# Patient Record
Sex: Female | Born: 1965 | Race: Black or African American | Hispanic: No | Marital: Single | Smoking: Never smoker
Health system: Southern US, Community
[De-identification: ages and names within clinical notes are randomized; demographics above are authoritative.]

## PROBLEM LIST (undated history)

## (undated) DIAGNOSIS — D649 Anemia, unspecified: Secondary | ICD-10-CM

## (undated) DIAGNOSIS — F70 Mild intellectual disabilities: Secondary | ICD-10-CM

## (undated) DIAGNOSIS — E119 Type 2 diabetes mellitus without complications: Secondary | ICD-10-CM

## (undated) DIAGNOSIS — E785 Hyperlipidemia, unspecified: Secondary | ICD-10-CM

## (undated) DIAGNOSIS — I10 Essential (primary) hypertension: Secondary | ICD-10-CM

## (undated) DIAGNOSIS — H9190 Unspecified hearing loss, unspecified ear: Secondary | ICD-10-CM

## (undated) HISTORY — PX: RETINAL DETACHMENT SURGERY: SHX105

## (undated) HISTORY — DX: Hyperlipidemia, unspecified: E78.5

## (undated) HISTORY — DX: Essential (primary) hypertension: I10

## (undated) HISTORY — DX: Type 2 diabetes mellitus without complications: E11.9

## (undated) HISTORY — DX: Anemia, unspecified: D64.9

## (undated) HISTORY — PX: CATARACT EXTRACTION: SUR2

## (undated) HISTORY — DX: Mild intellectual disabilities: F70

## (undated) HISTORY — DX: Unspecified hearing loss, unspecified ear: H91.90

---

## 2016-03-21 LAB — HEMOGLOBIN A1C: Hemoglobin A1C: 10.3

## 2016-05-02 ENCOUNTER — Ambulatory Visit: Payer: Self-pay | Admitting: "Endocrinology

## 2016-06-13 ENCOUNTER — Ambulatory Visit: Payer: Self-pay | Admitting: "Endocrinology

## 2016-08-06 ENCOUNTER — Encounter: Payer: Self-pay | Admitting: "Endocrinology

## 2016-08-06 ENCOUNTER — Ambulatory Visit (INDEPENDENT_AMBULATORY_CARE_PROVIDER_SITE_OTHER): Payer: Medicare Other | Admitting: "Endocrinology

## 2016-08-06 VITALS — BP 135/8 | HR 92 | Wt 139.0 lb

## 2016-08-06 DIAGNOSIS — E782 Mixed hyperlipidemia: Secondary | ICD-10-CM | POA: Insufficient documentation

## 2016-08-06 DIAGNOSIS — E118 Type 2 diabetes mellitus with unspecified complications: Secondary | ICD-10-CM | POA: Diagnosis not present

## 2016-08-06 DIAGNOSIS — I10 Essential (primary) hypertension: Secondary | ICD-10-CM | POA: Insufficient documentation

## 2016-08-06 DIAGNOSIS — E785 Hyperlipidemia, unspecified: Secondary | ICD-10-CM | POA: Diagnosis not present

## 2016-08-06 DIAGNOSIS — E1165 Type 2 diabetes mellitus with hyperglycemia: Secondary | ICD-10-CM

## 2016-08-06 DIAGNOSIS — IMO0002 Reserved for concepts with insufficient information to code with codable children: Secondary | ICD-10-CM | POA: Insufficient documentation

## 2016-08-06 MED ORDER — METFORMIN HCL 500 MG PO TABS: 500.0000 mg | ORAL_TABLET | Freq: Two times a day (BID) | ORAL | 3 refills | Status: DC

## 2016-08-06 NOTE — Progress Notes (Signed)
Subjective:    Patient ID: Ashley Blanchard, female    DOB: 1966-10-13. Patient is being seen in consultation for management of diabetes requested by  TAPPER,DAVID B, MD  Past Medical History:  Diagnosis Date  . Chronic anemia   . Diabetes mellitus, type II (HCC)   . Hearing loss   . Hyperlipidemia   . Hypertension   . Mild mental retardation    Past Surgical History:  Procedure Laterality Date  . CATARACT EXTRACTION    . RETINAL DETACHMENT SURGERY     Social History   Social History  . Marital status: Single    Spouse name: N/A  . Number of children: N/A  . Years of education: N/A   Social History Main Topics  . Smoking status: Never Smoker  . Smokeless tobacco: Never Used  . Alcohol use No  . Drug use: No  . Sexual activity: Not Asked   Other Topics Concern  . None   Social History Narrative  . None   Outpatient Encounter Prescriptions as of 08/06/2016  Medication Sig  . aspirin EC 81 MG tablet Take 81 mg by mouth daily.  Marland Kitchen. atorvastatin (LIPITOR) 20 MG tablet Take 20 mg by mouth daily.  Marland Kitchen. diltiazem (DILACOR XR) 180 MG 24 hr capsule Take 180 mg by mouth daily.  Marland Kitchen. latanoprost (XALATAN) 0.005 % ophthalmic solution 1 drop at bedtime.  . ramipril (ALTACE) 5 MG capsule Take 5 mg by mouth daily.  . traZODone (DESYREL) 50 MG tablet Take 50 mg by mouth at bedtime.  . [DISCONTINUED] glyBURIDE-metformin (GLUCOVANCE) 5-500 MG tablet Take 1 tablet by mouth daily with breakfast.  . metFORMIN (GLUCOPHAGE) 500 MG tablet Take 1 tablet (500 mg total) by mouth 2 (two) times daily with a meal.   No facility-administered encounter medications on file as of 08/06/2016.    ALLERGIES: No Known Allergies VACCINATION STATUS:  There is no immunization history on file for this patient.  Diabetes  She presents for her initial diabetic visit. She has type 2 diabetes mellitus. Onset time: She is not sure when she was diagnosed with diabetes. Her disease course has been worsening (Her  recent A1c is higher at 10.3% in May 2017.). There are no hypoglycemic associated symptoms. Pertinent negatives for hypoglycemia include no confusion, headaches, pallor or seizures. Associated symptoms include fatigue, polydipsia and polyuria. Pertinent negatives for diabetes include no chest pain and no polyphagia. There are no hypoglycemic complications. Symptoms are worsening. There are no diabetic complications. Risk factors for coronary artery disease include diabetes mellitus, hypertension and dyslipidemia. Current diabetic treatment includes oral agent (monotherapy) (She is on glyburide/metformin 08/999 twice a day). Compliance with diabetes treatment: She is in a group home due to failure to care for self. Her weight is stable. She is following a generally unhealthy diet. When asked about meal planning, she reported none. She has not had a previous visit with a dietitian. She rarely participates in exercise. Her overall blood glucose range is >200 mg/dl. An ACE inhibitor/angiotensin II receptor blocker is being taken.    Review of Systems  Constitutional: Positive for fatigue. Negative for unexpected weight change.  HENT: Negative for trouble swallowing and voice change.   Eyes: Negative for visual disturbance.  Respiratory: Negative for cough, shortness of breath and wheezing.   Cardiovascular: Negative for chest pain, palpitations and leg swelling.  Gastrointestinal: Negative for diarrhea, nausea and vomiting.  Endocrine: Positive for polydipsia and polyuria. Negative for cold intolerance, heat intolerance and polyphagia.  Genitourinary: Positive for frequency. Negative for dysuria and flank pain.  Musculoskeletal: Negative for arthralgias and myalgias.  Skin: Negative for color change, pallor, rash and wound.  Neurological: Negative for seizures and headaches.  Psychiatric/Behavioral: Negative for confusion and suicidal ideas.    Objective:    BP (!) 135/8   Pulse 92   Wt 139 lb (63  kg)   Wt Readings from Last 3 Encounters:  08/06/16 139 lb (63 kg)    Physical Exam  Constitutional:  Light build.  HENT:  Head: Normocephalic and atraumatic.  Eyes: EOM are normal.  Neck: Normal range of motion. Neck supple. No tracheal deviation present. No thyromegaly present.  Cardiovascular: Normal rate and regular rhythm.   Pulmonary/Chest: Effort normal and breath sounds normal.  Abdominal: Soft. Bowel sounds are normal. There is no tenderness. There is no guarding.  Musculoskeletal: Normal range of motion. She exhibits no edema.  Neurological: She is alert. No cranial nerve deficit. Coordination normal.  She is hard of hearing. She has reported mental retardation accompanied by her care manager.  Skin: Skin is warm and dry. No rash noted. No erythema. No pallor.  Psychiatric: She has a normal mood and affect.   03/29/2016 A1c was 10.3%.  Assessment & Plan:   1. Uncontrolled type 2 diabetes mellitus with complication, without long-term current use of insulin (HCC)  - Patient has currently uncontrolled symptomatic type 2 DM Duration unknown,  with most recent A1c of 10.3 %.  - She has no recent labs to review. Labs from May 2017 showed normal renal function.   - She has no reported gross complications from diabetes however patient remains at a high risk for more acute and chronic complications of diabetes which include CAD, CVA, CKD, retinopathy, and neuropathy.  -  She does not understand the high risk she has, these are all discussed with her care manager from the group home.  - I have counseled the patient on diet management by adopting a carbohydrate restricted/protein rich diet.  - Suggestion is made for patient to avoid simple carbohydrates   from their diet including Cakes , Desserts, Ice Cream,  Soda (  diet and regular) , Sweet Tea , Candies,  Chips, Cookies, Artificial Sweeteners,   and "Sugar-free" Products . This will help patient to have stable blood glucose  profile and potentially avoid unintended weight gain.  - I encouraged the patient to switch to  unprocessed or minimally processed complex starch and increased protein intake (animal or plant source), fruits, and vegetables.  - Patient is advised to stick to a routine mealtimes to eat 3 meals  a day and avoid unnecessary snacks ( to snack only to correct hypoglycemia).  - The patient will be scheduled with Norm Salt, RDN, CDE for individualized DM education.  - I have approached patient with the following individualized plan to manage diabetes and patient agrees:   - She will likely require insulin treatment for diabetes after appropriate monitoring routine is assured. - I have requested for  strict monitoring of glucose  AC and HS and return in one week with meter and logs.   -Patient is encouraged to call clinic for blood glucose levels less than 70 or above 300 mg /dl.  - I will disglyburide/metformin, risk outweighs benefit for this patient. - She would still benefit from low-dose metformin which I will prescribe separately at 500 mg by mouth twice a day.   - She is not suitable candidate for SGLt2 inhibitors nor  incretin therapy. - Patient specific target  A1c;  LDL, HDL, Triglycerides, and  Waist Circumference were discussed in detail.  2) BP/HT : controlled, continue current medications including ACEI/ARB. 3) Lipids/HPL:   control unknown, continue statins. 4)  Weight/Diet: she is light build,  , exercise, and detailed carbohydrates information provided.  5) Chronic Care/Health Maintenance:  -Patient is on ACEI/ARB and Statin medications and encouraged to continue to follow up with Ophthalmology, Podiatrist at least yearly or according to recommendations, and advised to   stay away from smoking. I have recommended yearly flu vaccine and pneumonia vaccination at least every 5 years; moderate intensity exercise for up to 150 minutes weekly; and  sleep for at least 7 hours a  day.  - 60 minutes of time was spent on the care of this patient , 50% of which was applied for counseling on diabetes complications and their preventions.  - Patient to bring meter and  blood glucose logs during their next visit.   - I advised patient to maintain close follow up with TAPPER,DAVID B, MD for primary care needs.  Follow up plan: - Return in about 1 week (around 08/13/2016) for follow up with pre-visit labs, meter, and logs, labs today.  Marquis Lunch, MD Phone: [phone]  Fax: [phone]   08/06/2016, 11:38 AM

## 2016-08-07 LAB — COMPREHENSIVE METABOLIC PANEL
ALT: 16 U/L (ref 6–29)
AST: 24 U/L (ref 10–35)
Albumin: 3.6 g/dL (ref 3.6–5.1)
Alkaline Phosphatase: 87 U/L (ref 33–130)
BUN: 14 mg/dL (ref 7–25)
CO2: 26 mmol/L (ref 20–31)
Calcium: 10.4 mg/dL (ref 8.6–10.4)
Chloride: 99 mmol/L (ref 98–110)
Creat: 0.98 mg/dL (ref 0.50–1.05)
Glucose, Bld: 250 mg/dL — ABNORMAL HIGH (ref 65–99)
Potassium: 5.1 mmol/L (ref 3.5–5.3)
Sodium: 138 mmol/L (ref 135–146)
Total Bilirubin: 0.3 mg/dL (ref 0.2–1.2)
Total Protein: 6.8 g/dL (ref 6.1–8.1)

## 2016-08-07 LAB — T4, FREE: Free T4: 1 ng/dL (ref 0.8–1.8)

## 2016-08-07 LAB — TSH: TSH: 2.49 mIU/L

## 2016-08-08 LAB — HEMOGLOBIN A1C
Hgb A1c MFr Bld: 9.4 % — ABNORMAL HIGH (ref <5.7)
Mean Plasma Glucose: 223 mg/dL

## 2016-08-14 ENCOUNTER — Ambulatory Visit: Payer: Medicaid Other | Admitting: "Endocrinology

## 2016-08-15 ENCOUNTER — Ambulatory Visit (INDEPENDENT_AMBULATORY_CARE_PROVIDER_SITE_OTHER): Payer: Medicare Other | Admitting: "Endocrinology

## 2016-08-15 ENCOUNTER — Encounter: Payer: Self-pay | Admitting: "Endocrinology

## 2016-08-15 VITALS — BP 133/83 | HR 92 | Wt 138.0 lb

## 2016-08-15 DIAGNOSIS — I10 Essential (primary) hypertension: Secondary | ICD-10-CM | POA: Diagnosis not present

## 2016-08-15 DIAGNOSIS — IMO0002 Reserved for concepts with insufficient information to code with codable children: Secondary | ICD-10-CM

## 2016-08-15 DIAGNOSIS — E1165 Type 2 diabetes mellitus with hyperglycemia: Secondary | ICD-10-CM | POA: Diagnosis not present

## 2016-08-15 DIAGNOSIS — E118 Type 2 diabetes mellitus with unspecified complications: Secondary | ICD-10-CM | POA: Diagnosis not present

## 2016-08-15 DIAGNOSIS — E782 Mixed hyperlipidemia: Secondary | ICD-10-CM

## 2016-08-15 MED ORDER — INSULIN GLARGINE 100 UNIT/ML SOLOSTAR PEN: 15.0000 [IU] | PEN_INJECTOR | Freq: Every day | SUBCUTANEOUS | 2 refills | Status: DC

## 2016-08-15 NOTE — Progress Notes (Signed)
Subjective:    Patient ID: Ashley Blanchard, female    DOB: [birth year]. Patient is being seen in consultation for management of diabetes requested by  TAPPER,DAVID B, MD  Past Medical History:  Diagnosis Date  . Chronic anemia   . Diabetes mellitus, type II (HCC)   . Hearing loss   . Hyperlipidemia   . Hypertension   . Mild mental retardation    Past Surgical History:  Procedure Laterality Date  . CATARACT EXTRACTION    . RETINAL DETACHMENT SURGERY     Social History   Social History  . Marital status: Single    Spouse name: N/A  . Number of children: N/A  . Years of education: N/A   Social History Main Topics  . Smoking status: Never Smoker  . Smokeless tobacco: Never Used  . Alcohol use No  . Drug use: No  . Sexual activity: Not Asked   Other Topics Concern  . None   Social History Narrative  . None   Outpatient Encounter Prescriptions as of 08/15/2016  Medication Sig  . aspirin EC 81 MG tablet Take 81 mg by mouth daily.  Marland Kitchen atorvastatin (LIPITOR) 20 MG tablet Take 20 mg by mouth daily.  Marland Kitchen diltiazem (DILACOR XR) 180 MG 24 hr capsule Take 180 mg by mouth daily.  . Insulin Glargine (LANTUS SOLOSTAR) 100 UNIT/ML Solostar Pen Inject 15 Units into the skin daily with breakfast.  . latanoprost (XALATAN) 0.005 % ophthalmic solution 1 drop at bedtime.  . metFORMIN (GLUCOPHAGE) 500 MG tablet Take 1 tablet (500 mg total) by mouth 2 (two) times daily with a meal.  . ramipril (ALTACE) 5 MG capsule Take 5 mg by mouth daily.  . traZODone (DESYREL) 50 MG tablet Take 50 mg by mouth at bedtime.  . [DISCONTINUED] Insulin Glargine (LANTUS SOLOSTAR) 100 UNIT/ML Solostar Pen Inject 15 Units into the skin daily with breakfast.   No facility-administered encounter medications on file as of 08/15/2016.    ALLERGIES: No Known Allergies VACCINATION STATUS:  There is no immunization history on file for this patient.  Diabetes  She presents for her follow-up diabetic visit. She has  type 2 diabetes mellitus. Onset time: She is not sure when she was diagnosed with diabetes. Her disease course has been worsening (Her recent A1c is higher at 10.3% in May 2017.). There are no hypoglycemic associated symptoms. Pertinent negatives for hypoglycemia include no confusion, headaches, pallor or seizures. Associated symptoms include fatigue, polydipsia and polyuria. Pertinent negatives for diabetes include no chest pain and no polyphagia. There are no hypoglycemic complications. Symptoms are worsening. There are no diabetic complications. Risk factors for coronary artery disease include diabetes mellitus, hypertension and dyslipidemia. Current diabetic treatment includes oral agent (monotherapy) (She is on glyburide/metformin 08/999 twice a day). Compliance with diabetes treatment: She is in a group home due to failure to care for self. Her weight is stable. She is following a generally unhealthy diet. When asked about meal planning, she reported none. She has not had a previous visit with a dietitian. She rarely participates in exercise. Her breakfast blood glucose range is generally 140-180 mg/dl. Her lunch blood glucose range is generally >200 mg/dl. Her dinner blood glucose range is generally >200 mg/dl. Her overall blood glucose range is >200 mg/dl. An ACE inhibitor/angiotensin II receptor blocker is being taken.    Review of Systems  Constitutional: Positive for fatigue. Negative for unexpected weight change.  HENT: Negative for trouble swallowing and voice change.  Eyes: Negative for visual disturbance.  Respiratory: Negative for cough, shortness of breath and wheezing.   Cardiovascular: Negative for chest pain, palpitations and leg swelling.  Gastrointestinal: Negative for diarrhea, nausea and vomiting.  Endocrine: Positive for polydipsia and polyuria. Negative for cold intolerance, heat intolerance and polyphagia.  Genitourinary: Positive for frequency. Negative for dysuria and flank  pain.  Musculoskeletal: Negative for arthralgias and myalgias.  Skin: Negative for color change, pallor, rash and wound.  Neurological: Negative for seizures and headaches.  Psychiatric/Behavioral: Negative for confusion and suicidal ideas.    Objective:    BP 133/83   Pulse 92   Wt 138 lb (62.6 kg)   Wt Readings from Last 3 Encounters:  08/15/16 138 lb (62.6 kg)  08/06/16 139 lb (63 kg)    Physical Exam  Constitutional:  Light build.  HENT:  Head: Normocephalic and atraumatic.  Eyes: EOM are normal.  Neck: Normal range of motion. Neck supple. No tracheal deviation present. No thyromegaly present.  Cardiovascular: Normal rate and regular rhythm.   Pulmonary/Chest: Effort normal and breath sounds normal.  Abdominal: Soft. Bowel sounds are normal. There is no tenderness. There is no guarding.  Musculoskeletal: Normal range of motion. She exhibits no edema.  Neurological: She is alert. No cranial nerve deficit. Coordination normal.  She is hard of hearing. She has reported mental retardation accompanied by her care manager.  Skin: Skin is warm and dry. No rash noted. No erythema. No pallor.  Psychiatric: She has a normal mood and affect.   03/29/2016 A1c was 10.3%.  Assessment & Plan:   1. Uncontrolled type 2 diabetes mellitus with complication, without long-term current use of insulin (HCC)  - Patient has currently uncontrolled symptomatic type 2 DM Duration unknown,  with most recent A1c of 10.3 %.  - She has no recent labs to review. Labs from May 2017 showed normal renal function.   - She has no reported gross complications from diabetes however patient remains at a high risk for more acute and chronic complications of diabetes which include CAD, CVA, CKD, retinopathy, and neuropathy.  -  She does not understand the high risk she has, these are all discussed with her care manager from the group home.  - I have counseled the patient on diet management by adopting a  carbohydrate restricted/protein rich diet.  - Suggestion is made for patient to avoid simple carbohydrates   from their diet including Cakes , Desserts, Ice Cream,  Soda (  diet and regular) , Sweet Tea , Candies,  Chips, Cookies, Artificial Sweeteners,   and "Sugar-free" Products . This will help patient to have stable blood glucose profile and potentially avoid unintended weight gain.  - I encouraged the patient to switch to  unprocessed or minimally processed complex starch and increased protein intake (animal or plant source), fruits, and vegetables.  - Patient is advised to stick to a routine mealtimes to eat 3 meals  a day and avoid unnecessary snacks ( to snack only to correct hypoglycemia).  - The patient will be scheduled with Norm SaltPenny Crumpton, RDN, CDE for individualized DM education.  - I have approached patient with the following individualized plan to manage diabetes and patient agrees:   - Based on her severe glycemic burden, She will  require insulin treatment for diabetes . - I will initiate Lantus 15 units subcutaneously daily with breakfast, associated with monitoring of blood glucose before meals and at bedtime.  -Patient's care taker is encouraged to call clinic  for blood glucose levels less than 70 or above 300 mg /dl.  - I will continue low  -dose metformin 500 mg by mouth twice a day.   - She is not suitable candidate for SGLt2 inhibitors nor incretin therapy. - Patient specific target  A1c;  LDL, HDL, Triglycerides, and  Waist Circumference were discussed in detail.  2) BP/HT : controlled, continue current medications including ACEI/ARB. 3) Lipids/HPL:   control unknown, continue statins. 4)  Weight/Diet: she is light build,  , exercise, and detailed carbohydrates information provided.  5) Chronic Care/Health Maintenance:  -Patient is on ACEI/ARB and Statin medications and encouraged to continue to follow up with Ophthalmology, Podiatrist at least yearly or according  to recommendations, and advised to   stay away from smoking. I have recommended yearly flu vaccine and pneumonia vaccination at least every 5 years; moderate intensity exercise for up to 150 minutes weekly; and  sleep for at least 7 hours a day.  - 30 minutes of time was spent on the care of this patient , 50% of which was applied for counseling on diabetes complications and their preventions.  - Patient to bring meter and  blood glucose logs during their next visit.   - I advised patient to maintain close follow up with TAPPER,DAVID B, MD for primary care needs.  Follow up plan: - Return in about 2 weeks (around 08/29/2016) for follow up with meter and logs- no labs.  Marquis Lunch, MD Phone: [phone]  Fax: [phone]   08/15/2016, 11:06 AM

## 2016-08-31 ENCOUNTER — Ambulatory Visit: Payer: Medicare Other | Admitting: "Endocrinology

## 2016-10-22 ENCOUNTER — Encounter (HOSPITAL_COMMUNITY): Payer: Self-pay | Admitting: Emergency Medicine

## 2016-10-22 ENCOUNTER — Inpatient Hospital Stay (HOSPITAL_COMMUNITY)
Admission: EM | Admit: 2016-10-22 | Discharge: 2016-10-25 | DRG: 872 | Disposition: A | Discharge status: Home / Self Care | Payer: Medicare Other | Attending: Internal Medicine | Admitting: Internal Medicine

## 2016-10-22 ENCOUNTER — Emergency Department (HOSPITAL_COMMUNITY): Payer: Medicare Other

## 2016-10-22 DIAGNOSIS — Z832 Family history of diseases of the blood and blood-forming organs and certain disorders involving the immune mechanism: Secondary | ICD-10-CM | POA: Diagnosis not present

## 2016-10-22 DIAGNOSIS — F70 Mild intellectual disabilities: Secondary | ICD-10-CM | POA: Diagnosis present

## 2016-10-22 DIAGNOSIS — D649 Anemia, unspecified: Secondary | ICD-10-CM | POA: Diagnosis present

## 2016-10-22 DIAGNOSIS — Z9849 Cataract extraction status, unspecified eye: Secondary | ICD-10-CM | POA: Diagnosis not present

## 2016-10-22 DIAGNOSIS — Z8261 Family history of arthritis: Secondary | ICD-10-CM

## 2016-10-22 DIAGNOSIS — Z8249 Family history of ischemic heart disease and other diseases of the circulatory system: Secondary | ICD-10-CM

## 2016-10-22 DIAGNOSIS — R410 Disorientation, unspecified: Secondary | ICD-10-CM

## 2016-10-22 DIAGNOSIS — N390 Urinary tract infection, site not specified: Secondary | ICD-10-CM | POA: Diagnosis present

## 2016-10-22 DIAGNOSIS — E872 Acidosis, unspecified: Secondary | ICD-10-CM | POA: Diagnosis present

## 2016-10-22 DIAGNOSIS — Z7982 Long term (current) use of aspirin: Secondary | ICD-10-CM | POA: Diagnosis not present

## 2016-10-22 DIAGNOSIS — H9190 Unspecified hearing loss, unspecified ear: Secondary | ICD-10-CM | POA: Diagnosis present

## 2016-10-22 DIAGNOSIS — J9811 Atelectasis: Secondary | ICD-10-CM | POA: Diagnosis present

## 2016-10-22 DIAGNOSIS — I10 Essential (primary) hypertension: Secondary | ICD-10-CM | POA: Diagnosis present

## 2016-10-22 DIAGNOSIS — Z811 Family history of alcohol abuse and dependence: Secondary | ICD-10-CM

## 2016-10-22 DIAGNOSIS — Z794 Long term (current) use of insulin: Secondary | ICD-10-CM | POA: Diagnosis not present

## 2016-10-22 DIAGNOSIS — M6281 Muscle weakness (generalized): Secondary | ICD-10-CM

## 2016-10-22 DIAGNOSIS — N3000 Acute cystitis without hematuria: Secondary | ICD-10-CM

## 2016-10-22 DIAGNOSIS — Z7984 Long term (current) use of oral hypoglycemic drugs: Secondary | ICD-10-CM | POA: Diagnosis not present

## 2016-10-22 DIAGNOSIS — E785 Hyperlipidemia, unspecified: Secondary | ICD-10-CM | POA: Diagnosis present

## 2016-10-22 DIAGNOSIS — D72829 Elevated white blood cell count, unspecified: Secondary | ICD-10-CM | POA: Diagnosis present

## 2016-10-22 DIAGNOSIS — E119 Type 2 diabetes mellitus without complications: Secondary | ICD-10-CM | POA: Diagnosis present

## 2016-10-22 DIAGNOSIS — A419 Sepsis, unspecified organism: Secondary | ICD-10-CM | POA: Diagnosis present

## 2016-10-22 DIAGNOSIS — Z833 Family history of diabetes mellitus: Secondary | ICD-10-CM | POA: Diagnosis not present

## 2016-10-22 DIAGNOSIS — E782 Mixed hyperlipidemia: Secondary | ICD-10-CM | POA: Diagnosis not present

## 2016-10-22 DIAGNOSIS — Z79899 Other long term (current) drug therapy: Secondary | ICD-10-CM

## 2016-10-22 LAB — COMPREHENSIVE METABOLIC PANEL
ALT: 26 U/L (ref 14–54)
AST: 38 U/L (ref 15–41)
Albumin: 2.6 g/dL — ABNORMAL LOW (ref 3.5–5.0)
Alkaline Phosphatase: 93 U/L (ref 38–126)
Anion gap: 9 (ref 5–15)
BUN: 19 mg/dL (ref 6–20)
CO2: 21 mmol/L — ABNORMAL LOW (ref 22–32)
Calcium: 8.4 mg/dL — ABNORMAL LOW (ref 8.9–10.3)
Chloride: 106 mmol/L (ref 101–111)
Creatinine, Ser: 0.89 mg/dL (ref 0.44–1.00)
GFR calc Af Amer: >60 mL/min (ref >60)
GFR calc non Af Amer: >60 mL/min (ref >60)
Glucose, Bld: 139 mg/dL — ABNORMAL HIGH (ref 65–99)
Potassium: 4.1 mmol/L (ref 3.5–5.1)
Sodium: 136 mmol/L (ref 135–145)
Total Bilirubin: 0.3 mg/dL (ref 0.3–1.2)
Total Protein: 5.5 g/dL — ABNORMAL LOW (ref 6.5–8.1)

## 2016-10-22 LAB — URINALYSIS, ROUTINE W REFLEX MICROSCOPIC
Bilirubin Urine: NEGATIVE
Glucose, UA: NEGATIVE mg/dL
Ketones, ur: NEGATIVE mg/dL
Leukocytes, UA: NEGATIVE
Nitrite: POSITIVE — AB
Protein, ur: NEGATIVE mg/dL
Specific Gravity, Urine: 1.013 (ref 1.005–1.030)
pH: 5 (ref 5.0–8.0)

## 2016-10-22 LAB — I-STAT CG4 LACTIC ACID, ED
Lactic Acid, Venous: 3.54 mmol/L (ref 0.5–1.9)
Lactic Acid, Venous: 2.75 mmol/L (ref 0.5–1.9)

## 2016-10-22 LAB — I-STAT TROPONIN, ED: Troponin i, poc: 0 ng/mL (ref 0.00–0.08)

## 2016-10-22 LAB — GLUCOSE, CAPILLARY: Glucose-Capillary: 88 mg/dL (ref 65–99)

## 2016-10-22 LAB — CBC WITH DIFFERENTIAL/PLATELET
Basophils Absolute: 0 10*3/uL (ref 0.0–0.1)
Basophils Relative: 0 %
Eosinophils Absolute: 0 10*3/uL (ref 0.0–0.7)
Eosinophils Relative: 0 %
HCT: 38 % (ref 36.0–46.0)
Hemoglobin: 11.8 g/dL — ABNORMAL LOW (ref 12.0–15.0)
Lymphocytes Relative: 3 %
Lymphs Abs: 0.4 10*3/uL — ABNORMAL LOW (ref 0.7–4.0)
MCH: 25.5 pg — ABNORMAL LOW (ref 26.0–34.0)
MCHC: 31.1 g/dL (ref 30.0–36.0)
MCV: 82.3 fL (ref 78.0–100.0)
Monocytes Absolute: 0.5 10*3/uL (ref 0.1–1.0)
Monocytes Relative: 4 %
Neutro Abs: 12.4 10*3/uL — ABNORMAL HIGH (ref 1.7–7.7)
Neutrophils Relative %: 93 %
Platelets: 236 10*3/uL (ref 150–400)
RBC: 4.62 MIL/uL (ref 3.87–5.11)
RDW: 15.6 % — ABNORMAL HIGH (ref 11.5–15.5)
WBC: 13.3 10*3/uL — ABNORMAL HIGH (ref 4.0–10.5)

## 2016-10-22 LAB — MRSA PCR SCREENING: MRSA by PCR: NEGATIVE

## 2016-10-22 LAB — C DIFFICILE QUICK SCREEN W PCR REFLEX
C Diff antigen: NEGATIVE
C Diff interpretation: NOT DETECTED
C Diff toxin: NEGATIVE

## 2016-10-22 LAB — PHOSPHORUS: Phosphorus: 2.9 mg/dL (ref 2.5–4.6)

## 2016-10-22 LAB — CBG MONITORING, ED: Glucose-Capillary: 102 mg/dL — ABNORMAL HIGH (ref 65–99)

## 2016-10-22 LAB — LIPASE, BLOOD: Lipase: 34 U/L (ref 11–51)

## 2016-10-22 LAB — MAGNESIUM: Magnesium: 1.1 mg/dL — ABNORMAL LOW (ref 1.7–2.4)

## 2016-10-22 LAB — LACTIC ACID, PLASMA: Lactic Acid, Venous: 2.6 mmol/L (ref 0.5–1.9)

## 2016-10-22 MED ORDER — INSULIN GLARGINE 100 UNIT/ML ~~LOC~~ SOLN
15.0000 [IU] | Freq: Every day | SUBCUTANEOUS | Status: DC
  Administered 2016-10-25: 15 [IU] via SUBCUTANEOUS
  Filled 2016-10-22 (×5): qty 0.15

## 2016-10-22 MED ORDER — DEXTROSE 5 % IV SOLN
2.0000 g | Freq: Two times a day (BID) | INTRAVENOUS | Status: DC
  Administered 2016-10-23 – 2016-10-25 (×5): 2 g via INTRAVENOUS
  Filled 2016-10-22 (×7): qty 2

## 2016-10-22 MED ORDER — ATORVASTATIN CALCIUM 20 MG PO TABS
20.0000 mg | ORAL_TABLET | Freq: Every day | ORAL | Status: DC
  Administered 2016-10-23 – 2016-10-25 (×3): 20 mg via ORAL
  Filled 2016-10-22 (×3): qty 1

## 2016-10-22 MED ORDER — ONDANSETRON HCL 4 MG/2ML IJ SOLN: 4.0000 mg | Freq: Four times a day (QID) | INTRAMUSCULAR | Status: DC | PRN

## 2016-10-22 MED ORDER — KETOROLAC TROMETHAMINE 30 MG/ML IJ SOLN
30.0000 mg | Freq: Once | INTRAMUSCULAR | Status: AC
  Administered 2016-10-22: 30 mg via INTRAVENOUS
  Filled 2016-10-22: qty 1

## 2016-10-22 MED ORDER — MAGNESIUM SULFATE 2 GM/50ML IV SOLN
2.0000 g | Freq: Once | INTRAVENOUS | Status: AC
  Administered 2016-10-22: 2 g via INTRAVENOUS
  Filled 2016-10-22: qty 50

## 2016-10-22 MED ORDER — LATANOPROST 0.005 % OP SOLN
OPHTHALMIC | Status: AC
Start: 2016-10-22 — End: 2016-10-22
  Filled 2016-10-22: qty 2.5

## 2016-10-22 MED ORDER — SODIUM CHLORIDE 0.9 % IV SOLN
INTRAVENOUS | Status: DC
  Administered 2016-10-22 – 2016-10-24 (×6): via INTRAVENOUS

## 2016-10-22 MED ORDER — MAGNESIUM SULFATE 4 GM/100ML IV SOLN
4.0000 g | Freq: Once | INTRAVENOUS | Status: DC
  Filled 2016-10-22: qty 100

## 2016-10-22 MED ORDER — ASPIRIN EC 81 MG PO TBEC
81.0000 mg | DELAYED_RELEASE_TABLET | Freq: Every day | ORAL | Status: DC
Start: 2016-10-22 — End: 2016-10-25
  Administered 2016-10-23 – 2016-10-25 (×3): 81 mg via ORAL
  Filled 2016-10-22 (×3): qty 1

## 2016-10-22 MED ORDER — INSULIN GLARGINE 100 UNIT/ML SOLOSTAR PEN: 15.0000 [IU] | PEN_INJECTOR | Freq: Every day | SUBCUTANEOUS | Status: DC

## 2016-10-22 MED ORDER — DILTIAZEM HCL ER COATED BEADS 180 MG PO CP24
180.0000 mg | ORAL_CAPSULE | Freq: Every day | ORAL | Status: DC
  Administered 2016-10-23 – 2016-10-25 (×3): 180 mg via ORAL
  Filled 2016-10-22 (×6): qty 1

## 2016-10-22 MED ORDER — TRAZODONE HCL 50 MG PO TABS
50.0000 mg | ORAL_TABLET | Freq: Every day | ORAL | Status: DC
Start: 2016-10-22 — End: 2016-10-25
  Administered 2016-10-22 – 2016-10-24 (×3): 50 mg via ORAL
  Filled 2016-10-22 (×3): qty 1

## 2016-10-22 MED ORDER — DEXTROSE 5 % IV SOLN
INTRAVENOUS | Status: AC
  Filled 2016-10-22: qty 2

## 2016-10-22 MED ORDER — DEXTROSE 5 % IV SOLN
2.0000 g | Freq: Once | INTRAVENOUS | Status: DC
  Filled 2016-10-22: qty 2

## 2016-10-22 MED ORDER — SODIUM CHLORIDE 0.9 % IV BOLUS (SEPSIS)
1000.0000 mL | Freq: Once | INTRAVENOUS | Status: AC
  Administered 2016-10-22: 1000 mL via INTRAVENOUS

## 2016-10-22 MED ORDER — KETOROLAC TROMETHAMINE 30 MG/ML IJ SOLN: 30.0000 mg | Freq: Once | INTRAMUSCULAR | Status: DC

## 2016-10-22 MED ORDER — ENOXAPARIN SODIUM 40 MG/0.4ML ~~LOC~~ SOLN
40.0000 mg | SUBCUTANEOUS | Status: DC
  Administered 2016-10-22 – 2016-10-24 (×3): 40 mg via SUBCUTANEOUS
  Filled 2016-10-22 (×3): qty 0.4

## 2016-10-22 MED ORDER — LATANOPROST 0.005 % OP SOLN
1.0000 [drp] | Freq: Every day | OPHTHALMIC | Status: DC
  Administered 2016-10-22 – 2016-10-25 (×3): 1 [drp] via OPHTHALMIC
  Filled 2016-10-22: qty 2.5

## 2016-10-22 MED ORDER — ONDANSETRON HCL 4 MG PO TABS: 4.0000 mg | ORAL_TABLET | Freq: Four times a day (QID) | ORAL | Status: DC | PRN

## 2016-10-22 MED ORDER — CEFEPIME HCL 2 G IJ SOLR
2.0000 g | Freq: Once | INTRAMUSCULAR | Status: AC
  Administered 2016-10-22: 2 g via INTRAVENOUS
  Filled 2016-10-22: qty 2

## 2016-10-22 MED ORDER — ACETAMINOPHEN 650 MG RE SUPP
650.0000 mg | Freq: Once | RECTAL | Status: AC
  Administered 2016-10-22: 650 mg via RECTAL
  Filled 2016-10-22: qty 1

## 2016-10-22 NOTE — ED Notes (Signed)
Pt had large incontinent loose stool.

## 2016-10-22 NOTE — ED Notes (Signed)
Lab at bedside

## 2016-10-22 NOTE — ED Notes (Signed)
Pt transported to xray and CT

## 2016-10-22 NOTE — H&P (Signed)
History and Physical    Torre Kuchera Ashley Blanchard DOB: 02/09/1966 DOA: 10/22/2016  PCP: Louie BostonAPPER,Paige Vanderwoude B, MD   Patient coming from: [facility]  Chief Complaint: Altered mental status.  HPI: Ashley Blanchard is a 50 y.o. female with medical history significant of chronic anemia, type 2 diabetes, hearing loss, hyperlipidemia, hypertension, mild mental retardation which is coming from [facility] due to altered mental status associated with nausea and vomiting and diarrhea since earlier today. Apparently the patient is normally verbal, able to work and walking yesterday. She was last seen at her baseline yesterday around 2030. No further history is available at this time.   ED Course: The patient received 2000 mL of normal saline bolus and IV cefepime. Her workup showed WBC of 13.3, hemoglobin 11.8 g/dL; initial lactic acid was 3.5 then went down to 2.75 mmol/L on the follow-up level. Her glucose was 139, magnesium 1.1 and phosphorus 2.9 mg/dL.   Imaging: Chest radiograph showed mild bibasilar subsegmental atelectasis. CT scan of the brain did not show any acute intracranial pathology.  Review of Systems: As per HPI otherwise 10 point review of systems negative.    Past Medical History:  Diagnosis Date  . Chronic anemia   . Diabetes mellitus, type II (HCC)   . Hearing loss   . Hyperlipidemia   . Hypertension   . Mild mental retardation     Past Surgical History:  Procedure Laterality Date  . CATARACT EXTRACTION    . RETINAL DETACHMENT SURGERY       reports that she has never smoked. She has never used smokeless tobacco. She reports that she does not drink alcohol or use drugs.  No Known Allergies  Family History  Problem Relation Age of Onset  . Arthritis Mother   . Hypertension Mother   . Clotting disorder Mother   . Pulmonary embolism Mother   . Alcohol abuse Father   . Diabetes Brother     Prior to Admission medications   Medication Sig Start Date End Date Taking?  Authorizing Provider  aspirin EC 81 MG tablet Take 81 mg by mouth daily.    Historical Provider, MD  atorvastatin (LIPITOR) 20 MG tablet Take 20 mg by mouth daily.    Historical Provider, MD  diltiazem (DILACOR XR) 180 MG 24 hr capsule Take 180 mg by mouth daily.    Historical Provider, MD  Insulin Glargine (LANTUS SOLOSTAR) 100 UNIT/ML Solostar Pen Inject 15 Units into the skin daily with breakfast. 08/15/16   Roma KayserGebreselassie W Nida, MD  latanoprost (XALATAN) 0.005 % ophthalmic solution 1 drop at bedtime.    Historical Provider, MD  metFORMIN (GLUCOPHAGE) 500 MG tablet Take 1 tablet (500 mg total) by mouth 2 (two) times daily with a meal. 08/06/16   Roma KayserGebreselassie W Nida, MD  ramipril (ALTACE) 5 MG capsule Take 5 mg by mouth daily.    Historical Provider, MD  traZODone (DESYREL) 50 MG tablet Take 50 mg by mouth at bedtime.    Historical Provider, MD    Physical Exam:  Constitutional: Febrile at 101.7 degrees Fahrenheit, obtunded. Vitals:   10/22/16 1630 10/22/16 1700 10/22/16 1725 10/22/16 1738  BP: 116/71 132/66 114/65   Pulse: (!) 128 (!) 133 (!) 129   Resp: 21 22 13    Temp:    (!) 100.8 F (38.2 C)  TempSrc:      SpO2: 95% 95% 99%   Weight:   65.3 kg (143 lb 15.4 oz)   Height:   5\' 1"  (1.549 m)  Eyes: PERRL, lids and conjunctivae normal ENMT: Mucous membranes are moist. Posterior pharynx clear of any exudate or lesions. Absent dentition. Neck: normal, supple, no masses, no thyromegaly Respiratory: clear to auscultation bilaterally, no wheezing, no crackles. Normal respiratory effort. No accessory muscle use.  Cardiovascular: Tachycardic at 120 BPM, no murmurs / rubs / gallops. No extremity edema. 2+ pedal pulses. No carotid bruits.  Abdomen: Soft, no tenderness, no masses palpated. No hepatosplenomegaly. Bowel sounds positive.  Musculoskeletal: no clubbing / cyanosis. Good ROM, no contractures. Decreased muscle tone.  Skin: no rashes, lesions, ulcers on very limited skin  exam. Neurologic: Opens eyes when asked, but unable to answer simple questions Psychiatric: Obtunded.    Labs on Admission: I have personally reviewed following labs and imaging studies  CBC:  Recent Labs Lab 10/22/16 1345  WBC 13.3*  NEUTROABS 12.4*  HGB 11.8*  HCT 38.0  MCV 82.3  PLT 236   Basic Metabolic Panel:  Recent Labs Lab 10/22/16 1345 10/22/16 1354  NA 136  --   K 4.1  --   CL 106  --   CO2 21*  --   GLUCOSE 139*  --   BUN 19  --   CREATININE 0.89  --   CALCIUM 8.4*  --   MG  --  1.1*  PHOS  --  2.9   GFR: Estimated Creatinine Clearance: 64 mL/min (by C-G formula based on SCr of 0.89 mg/dL). Liver Function Tests:  Recent Labs Lab 10/22/16 1345  AST 38  ALT 26  ALKPHOS 93  BILITOT 0.3  PROT 5.5*  ALBUMIN 2.6*    Recent Labs Lab 10/22/16 1345  LIPASE 34   No results for input(s): AMMONIA in the last 168 hours. Coagulation Profile: No results for input(s): INR, PROTIME in the last 168 hours. Cardiac Enzymes: No results for input(s): CKTOTAL, CKMB, CKMBINDEX, TROPONINI in the last 168 hours. BNP (last 3 results) No results for input(s): PROBNP in the last 8760 hours. HbA1C: No results for input(s): HGBA1C in the last 72 hours. CBG:  Recent Labs Lab 10/22/16 1314  GLUCAP 102*   Lipid Profile: No results for input(s): CHOL, HDL, LDLCALC, TRIG, CHOLHDL, LDLDIRECT in the last 72 hours. Thyroid Function Tests: No results for input(s): TSH, T4TOTAL, FREET4, T3FREE, THYROIDAB in the last 72 hours. Anemia Panel: No results for input(s): VITAMINB12, FOLATE, FERRITIN, TIBC, IRON, RETICCTPCT in the last 72 hours. Urine analysis:    Component Value Date/Time   COLORURINE YELLOW 10/22/2016 1308   APPEARANCEUR CLEAR 10/22/2016 1308   LABSPEC 1.013 10/22/2016 1308   PHURINE 5.0 10/22/2016 1308   GLUCOSEU NEGATIVE 10/22/2016 1308   HGBUR MODERATE (A) 10/22/2016 1308   BILIRUBINUR NEGATIVE 10/22/2016 1308   KETONESUR NEGATIVE 10/22/2016  1308   PROTEINUR NEGATIVE 10/22/2016 1308   NITRITE POSITIVE (A) 10/22/2016 1308   LEUKOCYTESUR NEGATIVE 10/22/2016 1308    Recent Results (from the past 240 hour(s))  C difficile quick scan w PCR reflex     Status: None   Collection Time: 10/22/16  3:29 PM  Result Value Ref Range Status   C Diff antigen NEGATIVE NEGATIVE Final   C Diff toxin NEGATIVE NEGATIVE Final   C Diff interpretation No C. difficile detected.  Final     Radiological Exams on Admission: Dg Chest 2 View  Result Date: 10/22/2016 CLINICAL DATA:  Episode of confusion and aphasia this morning. History of cognitive impairment. History of diabetes, hypertension, and hyperlipidemia. EXAM: CHEST  2 VIEW COMPARISON:  None. FINDINGS:  The lungs are hypoinflated. The interstitial markings are coarse at the bases due to crack vascular crowding. There is no alveolar infiltrate or pleural effusion. The cardiac silhouette and pulmonary vascularity are normal. The bony thorax is unremarkable. IMPRESSION: Mild bibasilar subsegmental atelectasis. No pneumonia nor other acute cardiopulmonary abnormality. Electronically Signed   By: Zed Wanninger  [place] M.D.   On: 10/22/2016 14:58   Ct Head Wo Contrast  Result Date: 10/22/2016 CLINICAL DATA:  Altered mental status with acute onset aphasia EXAM: CT HEAD WITHOUT CONTRAST TECHNIQUE: Contiguous axial images were obtained from the base of the skull through the vertex without intravenous contrast. COMPARISON:  None. FINDINGS: Brain: The ventricles are normal in size and configuration. There is no intracranial mass, hemorrhage, extra-axial fluid collection, or midline shift. There is minimal small vessel disease in the centra semiovale bilaterally. Elsewhere gray-white compartments are normal. No acute infarct is demonstrable. Vascular: There is no hyperdense vessel. There is no vascular calcification evident. Skull: The bony calvarium appears intact. Sinuses/Orbits: Visualized paranasal sinuses are clear.  Visualized orbits appear symmetric bilaterally. Other: Mastoid air cells are clear. IMPRESSION: Minimal periventricular small vessel disease. No acute infarct evident. No intracranial mass, hemorrhage, or extra-axial fluid collection. Electronically Signed   By: Bretta Bang III M.D.   On: 10/22/2016 15:33    EKG: Independently reviewed. Vent. rate 144 BPM PR interval 130 ms QRS duration 72 ms QT/QTc 262/405 ms P-R-T axes 46 76 30 Sinus tachycardia with occasional Premature ventricular complexes Otherwise normal ECG  Assessment/Plan Principal Problem:   Sepsis secondary to UTI (HCC) Admit to stepdown/inpatient. Received 2 L of normal saline bolus in the emergency department. Continue IV fluids. Continue cefepime 2 g IVP every 12 hours. Follow-up blood cultures and sensitivity. Follow-up urine culture and sensitivity.  Active Problems:     Lactic acidosis Improving with current treatment. Hold metformin. Follow-up level as needed or in a.m.    Type 2 diabetes mellitus (HCC) Carbohydrate modified diet. CBG monitoring with regular insulin sliding scale while in the hospital. Continue Lantus 15 units SQ daily. Hold metformin due to lactic acidosis.    Hypomagnesemia Replacing.  Hyperlipidemia Continue atorvastatin 20 mg by mouth daily. Interval monitoring of LFTs and lipid panel..    Essential hypertension, benign Continue Cardizem and 180 mg by mouth daily.    Chronic anemia Monitor hematocrit and hemoglobin.    Intellectual disability history Supportive care.    DVT prophylaxis: Lovenox SQ. Code Status: Full code. Family Communication:  Disposition Plan: Admit for further evaluation and IV antibiotic therapy for several days. Consults called:  Admission status: Stepdown/inpatient.   Bobette Mo MD [facility] Pager [phone].  If 7PM-7AM, please contact night-coverage [email or web address] Password [facility]  10/22/2016, 6:31 PM

## 2016-10-22 NOTE — ED Provider Notes (Signed)
[facility]-EMERGENCY DEPT Provider Note   CSN: [id number] Arrival date & time: 10/22/16  1239  By signing my name below, I, Placido Sou, attest that this documentation has been prepared under the direction and in the presence of Lavera Guise, MD. Electronically Signed: Placido Sou, ED Scribe. 10/22/16. 1:05 PM.   History   Chief Complaint Chief Complaint  Patient presents with  . Altered Mental Status    LEVEL FIVE CAVEAT: AMS  HPI HPI Comments: Shyna Formanek is a 50 y.o. female with a h/o MR who presents to the Emergency Department by ambulance due to AMS onset this morning. Pt states she has experienced n/v but denies diarrhea or any regions of pain. Per EMS, pt resides at [facility] and was found today experiencing n/v/d and not speaking. Pt is verbal at baseline. Pt was last witnessed behaving normally yesterday at 2030.   The history is provided by the patient, medical records and the EMS personnel. The history is limited by the condition of the patient. No language interpreter was used.    Past Medical History:  Diagnosis Date  . Chronic anemia   . Diabetes mellitus, type II (HCC)   . Hearing loss   . Hyperlipidemia   . Hypertension   . Mild mental retardation     Patient Active Problem List   Diagnosis Date Noted  . Sepsis secondary to UTI (HCC) 10/22/2016  . Chronic anemia 10/22/2016  . Lactic acidosis 10/22/2016  . Type 2 diabetes mellitus (HCC) 10/22/2016  . Uncontrolled type 2 diabetes mellitus with complication, without long-term current use of insulin (HCC) 08/06/2016  . Hyperlipidemia 08/06/2016  . Essential hypertension, benign 08/06/2016    Past Surgical History:  Procedure Laterality Date  . CATARACT EXTRACTION    . RETINAL DETACHMENT SURGERY      OB History    No data available       Home Medications    Prior to Admission medications   Medication Sig Start Date End Date Taking? Authorizing Provider  aspirin EC 81 MG tablet Take  81 mg by mouth daily.    Historical Provider, MD  atorvastatin (LIPITOR) 20 MG tablet Take 20 mg by mouth daily.    Historical Provider, MD  diltiazem (DILACOR XR) 180 MG 24 hr capsule Take 180 mg by mouth daily.    Historical Provider, MD  Insulin Glargine (LANTUS SOLOSTAR) 100 UNIT/ML Solostar Pen Inject 15 Units into the skin daily with breakfast. 08/15/16   Roma Kayser, MD  latanoprost (XALATAN) 0.005 % ophthalmic solution 1 drop at bedtime.    Historical Provider, MD  metFORMIN (GLUCOPHAGE) 500 MG tablet Take 1 tablet (500 mg total) by mouth 2 (two) times daily with a meal. 08/06/16   Roma Kayser, MD  ramipril (ALTACE) 5 MG capsule Take 5 mg by mouth daily.    Historical Provider, MD  traZODone (DESYREL) 50 MG tablet Take 50 mg by mouth at bedtime.    Historical Provider, MD    Family History Family History  Problem Relation Age of Onset  . Arthritis Mother   . Hypertension Mother   . Clotting disorder Mother   . Pulmonary embolism Mother   . Alcohol abuse Father   . Diabetes Brother     Social History Social History  Substance Use Topics  . Smoking status: Never Smoker  . Smokeless tobacco: Never Used  . Alcohol use No     Allergies   Patient has no known allergies.  Review of Systems Review of Systems  Unable to perform ROS: Mental status change   Physical Exam Updated Vital Signs BP 114/65   Pulse (!) 129   Temp (!) 100.8 F (38.2 C)   Resp 13   Ht 5\' 1"  (1.549 m)   Wt 143 lb 15.4 oz (65.3 kg)   LMP  (LMP Unknown)   SpO2 99%   BMI 27.20 kg/m   Physical Exam Nursing note and vitals reviewed. Constitutional: Non-toxic, and in no acute distress Head: Normocephalic and atraumatic.  Mouth/Throat: Oropharynx is clear and dry mucous membranes.  Neck: Normal range of motion. Neck supple.  Cardiovascular: Tachycardic rate and regular rhythm.   Pulmonary/Chest: Effort normal and breath sounds normal.  Abdominal: Soft. There is no tenderness.  There is no rebound and no guarding.  Musculoskeletal: Normal range of motion.  Neurological: Alert, no facial droop, moves all extremities symmetrically to command Skin: Skin is warm and dry.  Psychiatric: Cooperative  ED Treatments / Results  Labs (all labs ordered are listed, but only abnormal results are displayed) Labs Reviewed  CBC WITH DIFFERENTIAL/PLATELET - Abnormal; Notable for the following:       Result Value   WBC 13.3 (*)    Hemoglobin 11.8 (*)    MCH 25.5 (*)    RDW 15.6 (*)    Neutro Abs 12.4 (*)    Lymphs Abs 0.4 (*)    All other components within normal limits  COMPREHENSIVE METABOLIC PANEL - Abnormal; Notable for the following:    CO2 21 (*)    Glucose, Bld 139 (*)    Calcium 8.4 (*)    Total Protein 5.5 (*)    Albumin 2.6 (*)    All other components within normal limits  URINALYSIS, ROUTINE W REFLEX MICROSCOPIC - Abnormal; Notable for the following:    Hgb urine dipstick MODERATE (*)    Nitrite POSITIVE (*)    Bacteria, UA FEW (*)    All other components within normal limits  I-STAT CG4 LACTIC ACID, ED - Abnormal; Notable for the following:    Lactic Acid, Venous 3.54 (*)    All other components within normal limits  CBG MONITORING, ED - Abnormal; Notable for the following:    Glucose-Capillary 102 (*)    All other components within normal limits  I-STAT CG4 LACTIC ACID, ED - Abnormal; Notable for the following:    Lactic Acid, Venous 2.75 (*)    All other components within normal limits  C DIFFICILE QUICK SCREEN W PCR REFLEX  CULTURE, BLOOD (ROUTINE X 2)  CULTURE, BLOOD (ROUTINE X 2)  URINE CULTURE  MRSA PCR SCREENING  LIPASE, BLOOD  MAGNESIUM  PHOSPHORUS  I-STAT TROPOININ, ED    EKG  EKG Interpretation  Date/Time:  Monday October 22 2016 13:00:39 EST Ventricular Rate:  144 PR Interval:  130 QRS Duration: 72 QT Interval:  262 QTC Calculation: 405 R Axis:   76 Text Interpretation:  Sinus tachycardia with occasional Premature  ventricular complexes Otherwise normal ECG no prior EKG  Confirmed by Fidela Cieslak MD, Chasady Longwell [phone]([id number]) on 10/22/2016 1:28:36 PM       Radiology Dg Chest 2 View  Result Date: 10/22/2016 CLINICAL DATA:  Episode of confusion and aphasia this morning. History of cognitive impairment. History of diabetes, hypertension, and hyperlipidemia. EXAM: CHEST  2 VIEW COMPARISON:  None. FINDINGS: The lungs are hypoinflated. The interstitial markings are coarse at the bases due to crack vascular crowding. There is no alveolar infiltrate or pleural effusion.  The cardiac silhouette and pulmonary vascularity are normal. The bony thorax is unremarkable. IMPRESSION: Mild bibasilar subsegmental atelectasis. No pneumonia nor other acute cardiopulmonary abnormality. Electronically Signed   By: David  [place] M.D.   On: 10/22/2016 14:58   Ct Head Wo Contrast  Result Date: 10/22/2016 CLINICAL DATA:  Altered mental status with acute onset aphasia EXAM: CT HEAD WITHOUT CONTRAST TECHNIQUE: Contiguous axial images were obtained from the base of the skull through the vertex without intravenous contrast. COMPARISON:  None. FINDINGS: Brain: The ventricles are normal in size and configuration. There is no intracranial mass, hemorrhage, extra-axial fluid collection, or midline shift. There is minimal small vessel disease in the centra semiovale bilaterally. Elsewhere gray-white compartments are normal. No acute infarct is demonstrable. Vascular: There is no hyperdense vessel. There is no vascular calcification evident. Skull: The bony calvarium appears intact. Sinuses/Orbits: Visualized paranasal sinuses are clear. Visualized orbits appear symmetric bilaterally. Other: Mastoid air cells are clear. IMPRESSION: Minimal periventricular small vessel disease. No acute infarct evident. No intracranial mass, hemorrhage, or extra-axial fluid collection. Electronically Signed   By: Bretta Bang III M.D.   On: 10/22/2016 15:33     Procedures Procedures   CRITICAL CARE Performed by: Lavera Guise   Total critical care time: 35 minutes  Critical care time was exclusive of separately billable procedures and treating other patients.  Critical care was necessary to treat or prevent imminent or life-threatening deterioration.  Critical care was time spent personally by me on the following activities: development of treatment plan with patient and/or surrogate as well as nursing, discussions with consultants, evaluation of patient's response to treatment, examination of patient, obtaining history from patient or surrogate, ordering and performing treatments and interventions, ordering and review of laboratory studies, ordering and review of radiographic studies, pulse oximetry and re-evaluation of patient's condition.  COORDINATION OF CARE: 1:03 PM Pt is not providing clear answers to provider's questions. Will move forward based on information from EMS and pt medical records.   Medications Ordered in ED Medications  ketorolac (TORADOL) 30 MG/ML injection 30 mg (not administered)  sodium chloride 0.9 % bolus 1,000 mL (0 mLs Intravenous Stopped 10/22/16 1636)  sodium chloride 0.9 % bolus 1,000 mL (0 mLs Intravenous Stopped 10/22/16 1636)  acetaminophen (TYLENOL) suppository 650 mg (650 mg Rectal Given 10/22/16 1444)  ceFEPIme (MAXIPIME) 2 g in dextrose 5 % 50 mL IVPB (0 g Intravenous Stopped 10/22/16 1615)     Initial Impression / Assessment and Plan / ED Course  I have reviewed the triage vital signs and the nursing notes.  Pertinent labs & imaging results that were available during my care of the patient were reviewed by me and considered in my medical decision making (see chart for details).  Clinical Course    Presenting with AMS. Unclear baseline, but she obeys simple commands and able to speak to me with simple questions and answers. No gross deficits. CT head visualized and shows no acute intracranial  processes. Suspect mental status changes from infection and not concern for stroke at this time.   Has sepsis, fever of 102, tachycardia, leukocytosis. UA with nitrites. CXR visualized w/o pneumonia. Abdomen benign. With elevated lactate of 3.5.  Given IV fluids per sepsis protocol and cefepime for UTI.  Discussed with Dr. Robb Matar will admit for ongoing management and treatment.    I personally performed the services described in this documentation, which was scribed in my presence. The recorded information has been reviewed and is accurate.  Final  Clinical Impressions(s) / ED Diagnoses   Final diagnoses:  Delirium  Sepsis, due to unspecified organism [facility](HCC)  Acute cystitis without hematuria    New Prescriptions Current Discharge Medication List       Lavera Guiseana Duo Nisha Dhami, MD 10/22/16 1749

## 2016-10-22 NOTE — ED Notes (Signed)
Pt passed large watery  light brown loose stool. Peri care provided

## 2016-10-22 NOTE — Progress Notes (Signed)
Pharmacy Antibiotic Note  [name] is a 50 y.o. female admitted on 10/22/2016 with uro-sepsis.  Pharmacy has been consulted for Cefepime dosing.  Plan: Cefepime 2gm IV every 12 hours (first dose given in ED). Monitor labs, micro and vitals.   Height: 5\' 1"  (154.9 cm) Weight: 143 lb 15.4 oz (65.3 kg) IBW/kg (Calculated) : 47.8  Temp (24hrs), Avg:101.9 F (38.8 C), Min:100.8 F (38.2 C), Max:102.8 F (39.3 C)   Recent Labs Lab 10/22/16 1345 10/22/16 1354 10/22/16 1636  WBC 13.3*  --   --   CREATININE 0.89  --   --   LATICACIDVEN  --  3.54* 2.75*    Estimated Creatinine Clearance: 64 mL/min (by C-G formula based on SCr of 0.89 mg/dL).    No Known Allergies  Antimicrobials this admission: Cefepime 12/11 >>   Dose adjustments this admission: n/a   Microbiology results: 12/11 BCx:  12/11 UCx:   12/11 Cdiff:   Thank you for allowing pharmacy to be a part of this patient's care.  Mady GemmaHayes, Kimiye Strathman R 10/22/2016 6:29 PM

## 2016-10-22 NOTE — ED Notes (Signed)
One set of blood cultures obtained out of left hand and sent to lab

## 2016-10-22 NOTE — ED Triage Notes (Signed)
[facility]. Per report pt was up walking and went to work yesterday. Today pt has had n/v/d and AMS. Pt has not been speaking which per report is not her baseline. LKW was 2030 last night.

## 2016-10-23 LAB — BASIC METABOLIC PANEL
Anion gap: 8 (ref 5–15)
BUN: 22 mg/dL — ABNORMAL HIGH (ref 6–20)
CO2: 21 mmol/L — ABNORMAL LOW (ref 22–32)
Calcium: 7.6 mg/dL — ABNORMAL LOW (ref 8.9–10.3)
Chloride: 112 mmol/L — ABNORMAL HIGH (ref 101–111)
Creatinine, Ser: 0.98 mg/dL (ref 0.44–1.00)
GFR calc Af Amer: >60 mL/min (ref >60)
GFR calc non Af Amer: >60 mL/min (ref >60)
Glucose, Bld: 52 mg/dL — ABNORMAL LOW (ref 65–99)
Potassium: 3.8 mmol/L (ref 3.5–5.1)
Sodium: 141 mmol/L (ref 135–145)

## 2016-10-23 LAB — CBC WITH DIFFERENTIAL/PLATELET
Basophils Absolute: 0 10*3/uL (ref 0.0–0.1)
Basophils Relative: 0 %
Eosinophils Absolute: 0 10*3/uL (ref 0.0–0.7)
Eosinophils Relative: 0 %
HCT: 33.4 % — ABNORMAL LOW (ref 36.0–46.0)
Hemoglobin: 10.4 g/dL — ABNORMAL LOW (ref 12.0–15.0)
Lymphocytes Relative: 4 %
Lymphs Abs: 0.3 10*3/uL — ABNORMAL LOW (ref 0.7–4.0)
MCH: 25.5 pg — ABNORMAL LOW (ref 26.0–34.0)
MCHC: 31.1 g/dL (ref 30.0–36.0)
MCV: 81.9 fL (ref 78.0–100.0)
Monocytes Absolute: 0.4 10*3/uL (ref 0.1–1.0)
Monocytes Relative: 5 %
Neutro Abs: 7.3 10*3/uL (ref 1.7–7.7)
Neutrophils Relative %: 91 %
Platelets: 223 10*3/uL (ref 150–400)
RBC: 4.08 MIL/uL (ref 3.87–5.11)
RDW: 15.9 % — ABNORMAL HIGH (ref 11.5–15.5)
WBC: 7.9 10*3/uL (ref 4.0–10.5)

## 2016-10-23 LAB — GLUCOSE, CAPILLARY
Glucose-Capillary: 41 mg/dL — CL (ref 65–99)
Glucose-Capillary: 67 mg/dL (ref 65–99)
Glucose-Capillary: 88 mg/dL (ref 65–99)
Glucose-Capillary: 138 mg/dL — ABNORMAL HIGH (ref 65–99)
Glucose-Capillary: 84 mg/dL (ref 65–99)
Glucose-Capillary: 182 mg/dL — ABNORMAL HIGH (ref 65–99)

## 2016-10-23 LAB — LACTIC ACID, PLASMA: Lactic Acid, Venous: 0.9 mmol/L (ref 0.5–1.9)

## 2016-10-23 MED ORDER — INSULIN ASPART 100 UNIT/ML ~~LOC~~ SOLN
0.0000 [IU] | Freq: Three times a day (TID) | SUBCUTANEOUS | Status: DC
  Administered 2016-10-24 (×2): 2 [IU] via SUBCUTANEOUS
  Administered 2016-10-25: 3 [IU] via SUBCUTANEOUS
  Administered 2016-10-25: 5 [IU] via SUBCUTANEOUS

## 2016-10-23 NOTE — Progress Notes (Signed)
Inpatient Diabetes Program Recommendations  AACE/ADA: New Consensus Statement on Inpatient Glycemic Control (2015)  Target Ranges:  Prepandial:   less than 140 mg/dL      Peak postprandial:   less than 180 mg/dL (1-2 hours)      Critically ill patients:  140 - 180 mg/dL   Results for Chester HolsteinSCALES, Cosima (MRN [id number]) as of 10/23/2016 10:21  Ref. Range 10/22/2016 13:14 10/22/2016 21:47 10/23/2016 08:08 10/23/2016 08:57 10/23/2016 09:59  Glucose-Capillary Latest Ref Range: 65 - 99 mg/dL 562102 (H) 88 41 (LL) 67 88    Admit with: Sepsis/ UTI  History: DM  Home DM Meds: Lantus 15 units daily       Metformin 500 mg BID  Current Insulin Orders: Lantus 15 units daily        Novolog Sensitive Correction Scale/ SSI (0-9 units) TID AC         MD- Note patient with Hypoglycemia this AM (CBG 41 mg/dl).  Unsure why she was Hypoglycemic this AM?  No Insulin given to patient in-hospital yet since admission.  Please consider d/c of Lantus for now.      --Will follow patient during hospitalization--  Ambrose FinlandJeannine Johnston Zitlali Primm RN, MSN, CDE Diabetes Coordinator Inpatient Glycemic Control Team Team Pager: [phone] (8a-5p)

## 2016-10-23 NOTE — Progress Notes (Signed)
PROGRESS NOTE    Ashley Blanchard  ZOX:[id number] DOB: Apr 09, 1966 DOA: 10/22/2016 PCP: Louie BostonAPPER,DAVID B, MD    Brief Narrative:   50 y.o. female with medical history significant of chronic anemia, type 2 diabetes, hearing loss, hyperlipidemia, hypertension, mild mental retardation which is coming from [facility] due to altered mental status associated with nausea and vomiting and diarrhea.  Patient diagnosed with sepsis secondary to urinary tract source  Assessment & Plan:   Principal Problem:   Sepsis secondary to UTI (HCC) - Continue treatment with cefepime, with cultures. And follow-up with cultures - Leukocytosis has resolved - Patient still spiking fevers  Active Problems:   Hyperlipidemia - Continue with Lipitor, stable    Essential hypertension, benign   Chronic anemia   Lactic acidosis - resolved with improvement in condition and IVF rehydration    Type 2 diabetes mellitus (HCC) - Once mental status improved can advance to diabetic diet, otherwise continue sliding scale insulin and long-acting insulin regimen    Hypomagnesemia - Reassess magnesium levels next a.m.  DVT prophylaxis: Lovenox Code Status: Full Family Communication: None at bedside Disposition Plan: Pending improvement in condition, may consider transfer to floor next a.m. with continued improvement in condition   Consultants:   None   Procedures: None   Antimicrobials: Cefepime   Subjective: Patient has no new complaints. No acute issues overnight  Objective: Vitals:   10/23/16 1300 10/23/16 1400 10/23/16 1500 10/23/16 1600  BP: 133/67 (!) 143/68 (!) 146/75 (!) 151/79  Pulse: 99 95 95 93  Resp: 19 19 20 15   Temp:      TempSrc:      SpO2: 99% 97% 98% 98%  Weight:      Height:        Intake/Output Summary (Last 24 hours) at 10/23/16 1700 Last data filed at 10/23/16 1645  Gross per 24 hour  Intake             2470 ml  Output                0 ml  Net             2470 ml    Filed Weights   10/22/16 1725 10/23/16 0500  Weight: 65.3 kg (143 lb 15.4 oz) 66.5 kg (146 lb 9.7 oz)    Examination:  General exam: Appears calm and comfortable , In no acute distress Respiratory system: Clear to auscultation. Respiratory effort normal. Head: atraumatic normocephalic Cardiovascular system: S1 & S2 heard, RRR. No JVD, murmurs, rubs, gallops or clicks.  Gastrointestinal system: Abdomen is nondistended, soft and nontender. No organomegaly or masses felt.  Central nervous system: Patient is confused, no facial asymmetry, moves extremities equally Extremities: Symmetric 5 x 5 power. Skin: No rashes, lesions or ulcers, limited exam Psychiatry: Unable to accurately assess secondary to confusion    Data Reviewed: I have personally reviewed following labs and imaging studies  CBC:  Recent Labs Lab 10/22/16 1345 10/23/16 0406  WBC 13.3* 7.9  NEUTROABS 12.4* 7.3  HGB 11.8* 10.4*  HCT 38.0 33.4*  MCV 82.3 81.9  PLT 236 223   Basic Metabolic Panel:  Recent Labs Lab 10/22/16 1345 10/22/16 1354 10/23/16 0406  NA 136  --  141  K 4.1  --  3.8  CL 106  --  112*  CO2 21*  --  21*  GLUCOSE 139*  --  52*  BUN 19  --  22*  CREATININE 0.89  --  0.98  CALCIUM 8.4*  --  7.6*  MG  --  1.1*  --   PHOS  --  2.9  --    GFR: Estimated Creatinine Clearance: 58.6 mL/min (by C-G formula based on SCr of 0.98 mg/dL). Liver Function Tests:  Recent Labs Lab 10/22/16 1345  AST 38  ALT 26  ALKPHOS 93  BILITOT 0.3  PROT 5.5*  ALBUMIN 2.6*    Recent Labs Lab 10/22/16 1345  LIPASE 34   No results for input(s): AMMONIA in the last 168 hours. Coagulation Profile: No results for input(s): INR, PROTIME in the last 168 hours. Cardiac Enzymes: No results for input(s): CKTOTAL, CKMB, CKMBINDEX, TROPONINI in the last 168 hours. BNP (last 3 results) No results for input(s): PROBNP in the last 8760 hours. HbA1C: No results for input(s): HGBA1C in the last 72  hours. CBG:  Recent Labs Lab 10/23/16 0808 10/23/16 0857 10/23/16 0959 10/23/16 1234 10/23/16 1639  GLUCAP 41* 67 88 138* 84   Lipid Profile: No results for input(s): CHOL, HDL, LDLCALC, TRIG, CHOLHDL, LDLDIRECT in the last 72 hours. Thyroid Function Tests: No results for input(s): TSH, T4TOTAL, FREET4, T3FREE, THYROIDAB in the last 72 hours. Anemia Panel: No results for input(s): VITAMINB12, FOLATE, FERRITIN, TIBC, IRON, RETICCTPCT in the last 72 hours. Sepsis Labs:  Recent Labs Lab 10/22/16 1354 10/22/16 1636 10/22/16 1957 10/23/16 0406  LATICACIDVEN 3.54* 2.75* 2.6* 0.9    Recent Results (from the past 240 hour(s))  C difficile quick scan w PCR reflex     Status: None   Collection Time: 10/22/16  3:29 PM  Result Value Ref Range Status   C Diff antigen NEGATIVE NEGATIVE Final   C Diff toxin NEGATIVE NEGATIVE Final   C Diff interpretation No C. difficile detected.  Final  Blood culture (routine x 2)     Status: None (Preliminary result)   Collection Time: 10/22/16  4:30 PM  Result Value Ref Range Status   Specimen Description BLOOD LEFT HAND  Final   Special Requests BOTTLES DRAWN AEROBIC AND ANAEROBIC [facility]6CC EACH  Final   Culture PENDING  Incomplete   Report Status PENDING  Incomplete  MRSA PCR Screening     Status: None   Collection Time: 10/22/16  5:48 PM  Result Value Ref Range Status   MRSA by PCR NEGATIVE NEGATIVE Final    Comment:        The GeneXpert MRSA Assay (FDA approved for NASAL specimens only), is one component of a comprehensive MRSA colonization surveillance program. It is not intended to diagnose MRSA infection nor to guide or monitor treatment for MRSA infections.   Blood culture (routine x 2)     Status: None (Preliminary result)   Collection Time: 10/22/16  8:08 PM  Result Value Ref Range Status   Specimen Description BLOOD LEFT WRIST  Final   Special Requests BOTTLES DRAWN AEROBIC AND ANAEROBIC [facility]6CC EACH  Final   Culture PENDING   Incomplete   Report Status PENDING  Incomplete         Radiology Studies: Dg Chest 2 View  Result Date: 10/22/2016 CLINICAL DATA:  Episode of confusion and aphasia this morning. History of cognitive impairment. History of diabetes, hypertension, and hyperlipidemia. EXAM: CHEST  2 VIEW COMPARISON:  None. FINDINGS: The lungs are hypoinflated. The interstitial markings are coarse at the bases due to crack vascular crowding. There is no alveolar infiltrate or pleural effusion. The cardiac silhouette and pulmonary vascularity are normal. The bony thorax is unremarkable. IMPRESSION: Mild  bibasilar subsegmental atelectasis. No pneumonia nor other acute cardiopulmonary abnormality. Electronically Signed   By: David  [place] M.D.   On: 10/22/2016 14:58   Ct Head Wo Contrast  Result Date: 10/22/2016 CLINICAL DATA:  Altered mental status with acute onset aphasia EXAM: CT HEAD WITHOUT CONTRAST TECHNIQUE: Contiguous axial images were obtained from the base of the skull through the vertex without intravenous contrast. COMPARISON:  None. FINDINGS: Brain: The ventricles are normal in size and configuration. There is no intracranial mass, hemorrhage, extra-axial fluid collection, or midline shift. There is minimal small vessel disease in the centra semiovale bilaterally. Elsewhere gray-white compartments are normal. No acute infarct is demonstrable. Vascular: There is no hyperdense vessel. There is no vascular calcification evident. Skull: The bony calvarium appears intact. Sinuses/Orbits: Visualized paranasal sinuses are clear. Visualized orbits appear symmetric bilaterally. Other: Mastoid air cells are clear. IMPRESSION: Minimal periventricular small vessel disease. No acute infarct evident. No intracranial mass, hemorrhage, or extra-axial fluid collection. Electronically Signed   By: Bretta Bang III M.D.   On: 10/22/2016 15:33        Scheduled Meds: . aspirin EC  81 mg Oral Daily  . atorvastatin  20  mg Oral Daily  . ceFEPime (MAXIPIME) IV  2 g Intravenous Q12H  . diltiazem  180 mg Oral Daily  . enoxaparin (LOVENOX) injection  40 mg Subcutaneous Q24H  . insulin aspart  0-9 Units Subcutaneous TID WC  . insulin glargine  15 Units Subcutaneous Q breakfast  . latanoprost  1 drop Both Eyes QHS  . traZODone  50 mg Oral QHS   Continuous Infusions: . sodium chloride 100 mL/hr at 10/23/16 1645     LOS: 1 day    Time spent: > 35 minutes  Penny Pia, MD [facility] Pager [phone]  If 7PM-7AM, please contact night-coverage [email or web address] Password [email or web address] 10/23/2016, 5:00 PM

## 2016-10-23 NOTE — Plan of Care (Signed)
Problem: Safety: Goal: Ability to remain free from injury will improve Outcome: Progressing Bed alarm on while patient is in bed.

## 2016-10-24 DIAGNOSIS — I10 Essential (primary) hypertension: Secondary | ICD-10-CM

## 2016-10-24 DIAGNOSIS — E872 Acidosis: Secondary | ICD-10-CM

## 2016-10-24 DIAGNOSIS — N390 Urinary tract infection, site not specified: Secondary | ICD-10-CM

## 2016-10-24 DIAGNOSIS — A419 Sepsis, unspecified organism: Principal | ICD-10-CM

## 2016-10-24 DIAGNOSIS — Z794 Long term (current) use of insulin: Secondary | ICD-10-CM

## 2016-10-24 DIAGNOSIS — E782 Mixed hyperlipidemia: Secondary | ICD-10-CM

## 2016-10-24 DIAGNOSIS — E118 Type 2 diabetes mellitus with unspecified complications: Secondary | ICD-10-CM

## 2016-10-24 LAB — BASIC METABOLIC PANEL
Anion gap: 6 (ref 5–15)
BUN: 13 mg/dL (ref 6–20)
CO2: 21 mmol/L — ABNORMAL LOW (ref 22–32)
Calcium: 7.9 mg/dL — ABNORMAL LOW (ref 8.9–10.3)
Chloride: 112 mmol/L — ABNORMAL HIGH (ref 101–111)
Creatinine, Ser: 0.9 mg/dL (ref 0.44–1.00)
GFR calc Af Amer: >60 mL/min (ref >60)
GFR calc non Af Amer: >60 mL/min (ref >60)
Glucose, Bld: 176 mg/dL — ABNORMAL HIGH (ref 65–99)
Potassium: 4.2 mmol/L (ref 3.5–5.1)
Sodium: 139 mmol/L (ref 135–145)

## 2016-10-24 LAB — CBC WITH DIFFERENTIAL/PLATELET
Basophils Absolute: 0 10*3/uL (ref 0.0–0.1)
Basophils Relative: 0 %
Eosinophils Absolute: 0 10*3/uL (ref 0.0–0.7)
Eosinophils Relative: 0 %
HCT: 38.4 % (ref 36.0–46.0)
Hemoglobin: 11.6 g/dL — ABNORMAL LOW (ref 12.0–15.0)
Lymphocytes Relative: 18 %
Lymphs Abs: 0.7 10*3/uL (ref 0.7–4.0)
MCH: 24.9 pg — ABNORMAL LOW (ref 26.0–34.0)
MCHC: 30.2 g/dL (ref 30.0–36.0)
MCV: 82.6 fL (ref 78.0–100.0)
Monocytes Absolute: 0.7 10*3/uL (ref 0.1–1.0)
Monocytes Relative: 18 %
Neutro Abs: 2.5 10*3/uL (ref 1.7–7.7)
Neutrophils Relative %: 64 %
Platelets: 176 10*3/uL (ref 150–400)
RBC: 4.65 MIL/uL (ref 3.87–5.11)
RDW: 16.4 % — ABNORMAL HIGH (ref 11.5–15.5)
WBC: 3.8 10*3/uL — ABNORMAL LOW (ref 4.0–10.5)

## 2016-10-24 LAB — GLUCOSE, CAPILLARY
Glucose-Capillary: 115 mg/dL — ABNORMAL HIGH (ref 65–99)
Glucose-Capillary: 164 mg/dL — ABNORMAL HIGH (ref 65–99)
Glucose-Capillary: 169 mg/dL — ABNORMAL HIGH (ref 65–99)
Glucose-Capillary: 251 mg/dL — ABNORMAL HIGH (ref 65–99)

## 2016-10-24 LAB — MAGNESIUM: Magnesium: 2.2 mg/dL (ref 1.7–2.4)

## 2016-10-24 MED ORDER — LATANOPROST 0.005 % OP SOLN
OPHTHALMIC | Status: AC
  Filled 2016-10-24: qty 2.5

## 2016-10-24 NOTE — Care Management Note (Signed)
Case Management Note  Patient Details  Name: Ashley HolsteinRowena Blanchard MRN: [id number] Date of Birth: 1966/09/20  Subjective/Objective:                  Pt admitted with sepsis. She is from [facility]. CSW is aware and following pt. Pt active with [facility] services. Vickie, of [facility], aware of admission.   Action/Plan: Anticipate return to ALF with resumption of HH services.   Expected Discharge Date:     10/26/2016             Expected Discharge Plan:  Assisted Living / Rest Home  In-House Referral:  Clinical Social Work  Discharge planning Services  CM Consult  Post Acute Care Choice:  [place], [place] [facility] offered to:  [facility]  HH Arranged:  RN HH Agency:  [facility]  Status of Service:  In process, will continue to follow  Malcolm MetroChildress, Bryn Saline Demske, RN 10/24/2016, 3:46 PM

## 2016-10-24 NOTE — Progress Notes (Signed)
PROGRESS NOTE    Ashley Blanchard  [id number] DOB: 01-09-1966 DOA: 10/22/2016 PCP: Louie BostonAPPER,DAVID B, MD    Brief Narrative:   50 y.o. female with medical history significant of chronic anemia, type 2 diabetes, hearing loss, hyperlipidemia, hypertension, mild mental retardation which is coming from [facility] due to altered mental status associated with nausea and vomiting and diarrhea.  Patient diagnosed with sepsis secondary to urinary tract source  Assessment & Plan:   Principal Problem:   Sepsis secondary to UTI (HCC) - Continue treatment with cefepime  - blood cultures have not shown any growth yet, but urine culture growing GNR. Will follow up - Leukocytosis has resolved - fevers resolved  Active Problems:   Hyperlipidemia - Continue with Lipitor, stable    Essential hypertension, benign.   - Blood pressures stable.   - continue diltiazem  - ramipril currently on hold    Lactic acidosis - resolved with improvement in condition and IVF rehydration    Type 2 diabetes mellitus (HCC) - blood sugars have been on lower side. Will advance diet to carb mod. If blood sugars start to rise, will restart basal insulin. Continue SSI for now.    Hypomagnesemia - Reassess magnesium levels next a.m.  DVT prophylaxis: Lovenox Code Status: Full Family Communication: discussed with sister at bedside Disposition Plan: transfer to medical floor. Possible discharge in AM   Consultants:   None   Procedures: None   Antimicrobials: Cefepime   Subjective: No shortness of breath, cough or vomiting.  Objective: Vitals:   10/24/16 0500 10/24/16 0600 10/24/16 0700 10/24/16 0802  BP:  (!) 103/56 (!) 90/37 (!) 87/54  Pulse: 81 75 77 86  Resp: 13 11 14 18   Temp:    97.7 F (36.5 C)  TempSrc:    Axillary  SpO2: 100% 100% 100% 100%  Weight: 67.9 kg (149 lb 11.1 oz)     Height:        Intake/Output Summary (Last 24 hours) at 10/24/16 1005 Last data filed at 10/24/16  0600  Gross per 24 hour  Intake             2600 ml  Output                0 ml  Net             2600 ml   Filed Weights   10/22/16 1725 10/23/16 0500 10/24/16 0500  Weight: 65.3 kg (143 lb 15.4 oz) 66.5 kg (146 lb 9.7 oz) 67.9 kg (149 lb 11.1 oz)    Examination:  General exam: Appears calm and comfortable , In no acute distress Respiratory system: Clear to auscultation. Respiratory effort normal. Head: atraumatic normocephalic Cardiovascular system: S1 & S2 heard, RRR. No JVD, murmurs, rubs, gallops or clicks.  Gastrointestinal system: Abdomen is nondistended, soft and nontender. No organomegaly or masses felt.  Central nervous system: Patient is confused, no facial asymmetry, moves extremities equally Extremities: Symmetric 5 x 5 power. Skin: No rashes, lesions or ulcers, limited exam Psychiatry: Unable to accurately assess secondary to confusion    Data Reviewed: I have personally reviewed following labs and imaging studies  CBC:  Recent Labs Lab 10/22/16 1345 10/23/16 0406 10/24/16 0452  WBC 13.3* 7.9 3.8*  NEUTROABS 12.4* 7.3 2.5  HGB 11.8* 10.4* 11.6*  HCT 38.0 33.4* 38.4  MCV 82.3 81.9 82.6  PLT 236 223 176   Basic Metabolic Panel:  Recent Labs Lab 10/22/16 1345 10/22/16 1354 10/23/16 0406 10/24/16  0452  NA 136  --  141 139  K 4.1  --  3.8 4.2  CL 106  --  112* 112*  CO2 21*  --  21* 21*  GLUCOSE 139*  --  52* 176*  BUN 19  --  22* 13  CREATININE 0.89  --  0.98 0.90  CALCIUM 8.4*  --  7.6* 7.9*  MG  --  1.1*  --  2.2  PHOS  --  2.9  --   --    GFR: Estimated Creatinine Clearance: 64.4 mL/min (by C-G formula based on SCr of 0.9 mg/dL). Liver Function Tests:  Recent Labs Lab 10/22/16 1345  AST 38  ALT 26  ALKPHOS 93  BILITOT 0.3  PROT 5.5*  ALBUMIN 2.6*    Recent Labs Lab 10/22/16 1345  LIPASE 34   No results for input(s): AMMONIA in the last 168 hours. Coagulation Profile: No results for input(s): INR, PROTIME in the last 168  hours. Cardiac Enzymes: No results for input(s): CKTOTAL, CKMB, CKMBINDEX, TROPONINI in the last 168 hours. BNP (last 3 results) No results for input(s): PROBNP in the last 8760 hours. HbA1C: No results for input(s): HGBA1C in the last 72 hours. CBG:  Recent Labs Lab 10/23/16 0959 10/23/16 1234 10/23/16 1639 10/23/16 2230 10/24/16 0801  GLUCAP 88 138* 84 182* 115*   Lipid Profile: No results for input(s): CHOL, HDL, LDLCALC, TRIG, CHOLHDL, LDLDIRECT in the last 72 hours. Thyroid Function Tests: No results for input(s): TSH, T4TOTAL, FREET4, T3FREE, THYROIDAB in the last 72 hours. Anemia Panel: No results for input(s): VITAMINB12, FOLATE, FERRITIN, TIBC, IRON, RETICCTPCT in the last 72 hours. Sepsis Labs:  Recent Labs Lab 10/22/16 1354 10/22/16 1636 10/22/16 1957 10/23/16 0406  LATICACIDVEN 3.54* 2.75* 2.6* 0.9    Recent Results (from the past 240 hour(s))  Urine culture     Status: Abnormal (Preliminary result)   Collection Time: 10/22/16  2:59 PM  Result Value Ref Range Status   Specimen Description URINE, RANDOM  Final   Special Requests NONE  Final   Culture >=100,000 COLONIES/mL GRAM NEGATIVE RODS (A)  Final   Report Status PENDING  Incomplete  C difficile quick scan w PCR reflex     Status: None   Collection Time: 10/22/16  3:29 PM  Result Value Ref Range Status   C Diff antigen NEGATIVE NEGATIVE Final   C Diff toxin NEGATIVE NEGATIVE Final   C Diff interpretation No C. difficile detected.  Final  Blood culture (routine x 2)     Status: None (Preliminary result)   Collection Time: 10/22/16  4:30 PM  Result Value Ref Range Status   Specimen Description BLOOD LEFT HAND  Final   Special Requests BOTTLES DRAWN AEROBIC AND ANAEROBIC 6CC EACH  Final   Culture NO GROWTH 2 DAYS  Final   Report Status PENDING  Incomplete  MRSA PCR Screening     Status: None   Collection Time: 10/22/16  5:48 PM  Result Value Ref Range Status   MRSA by PCR NEGATIVE NEGATIVE Final      Comment:        The GeneXpert MRSA Assay (FDA approved for NASAL specimens only), is one component of a comprehensive MRSA colonization surveillance program. It is not intended to diagnose MRSA infection nor to guide or monitor treatment for MRSA infections.   Blood culture (routine x 2)     Status: None (Preliminary result)   Collection Time: 10/22/16  8:08 PM  Result Value  Ref Range Status   Specimen Description BLOOD LEFT WRIST  Final   Special Requests BOTTLES DRAWN AEROBIC AND ANAEROBIC [facility] EACH  Final   Culture PENDING  Incomplete   Report Status PENDING  Incomplete         Radiology Studies: Dg Chest 2 View  Result Date: 10/22/2016 CLINICAL DATA:  Episode of confusion and aphasia this morning. History of cognitive impairment. History of diabetes, hypertension, and hyperlipidemia. EXAM: CHEST  2 VIEW COMPARISON:  None. FINDINGS: The lungs are hypoinflated. The interstitial markings are coarse at the bases due to crack vascular crowding. There is no alveolar infiltrate or pleural effusion. The cardiac silhouette and pulmonary vascularity are normal. The bony thorax is unremarkable. IMPRESSION: Mild bibasilar subsegmental atelectasis. No pneumonia nor other acute cardiopulmonary abnormality. Electronically Signed   By: David  Swaziland M.D.   On: 10/22/2016 14:58   Ct Head Wo Contrast  Result Date: 10/22/2016 CLINICAL DATA:  Altered mental status with acute onset aphasia EXAM: CT HEAD WITHOUT CONTRAST TECHNIQUE: Contiguous axial images were obtained from the base of the skull through the vertex without intravenous contrast. COMPARISON:  None. FINDINGS: Brain: The ventricles are normal in size and configuration. There is no intracranial mass, hemorrhage, extra-axial fluid collection, or midline shift. There is minimal small vessel disease in the centra semiovale bilaterally. Elsewhere gray-white compartments are normal. No acute infarct is demonstrable. Vascular: There is no  hyperdense vessel. There is no vascular calcification evident. Skull: The bony calvarium appears intact. Sinuses/Orbits: Visualized paranasal sinuses are clear. Visualized orbits appear symmetric bilaterally. Other: Mastoid air cells are clear. IMPRESSION: Minimal periventricular small vessel disease. No acute infarct evident. No intracranial mass, hemorrhage, or extra-axial fluid collection. Electronically Signed   By: Bretta Bang III M.D.   On: 10/22/2016 15:33        Scheduled Meds: . aspirin EC  81 mg Oral Daily  . atorvastatin  20 mg Oral Daily  . ceFEPime (MAXIPIME) IV  2 g Intravenous Q12H  . diltiazem  180 mg Oral Daily  . enoxaparin (LOVENOX) injection  40 mg Subcutaneous Q24H  . insulin aspart  0-9 Units Subcutaneous TID WC  . insulin glargine  15 Units Subcutaneous Q breakfast  . latanoprost  1 drop Both Eyes QHS  . traZODone  50 mg Oral QHS   Continuous Infusions: . sodium chloride 100 mL/hr at 10/23/16 2346     LOS: 2 days    Time spent: 25 minutes  Moriya Mitchell, MD [facility] Pager [phone]  If 7PM-7AM, please contact night-coverage [email or web address] Password [email or web address] 10/24/2016, 10:05 AM

## 2016-10-24 NOTE — Evaluation (Signed)
Physical Therapy Evaluation Patient Details Name: Ashley Blanchard MRN: [id number] DOB: 1966/08/02 Today's Date: 10/24/2016   History of Present Illness  50 y.o. female with medical history significant of chronic anemia, type 2 diabetes, hearing loss, hyperlipidemia, hypertension, mild mental retardation which is coming from [facility] due to altered mental status associated with nausea and vomiting and diarrhea since earlier today. Apparently the patient is normally verbal, able to work and walking yesterday. She was last seen at her baseline yesterday around 2030. No further history is available at this time.   Dx: sepsis due to UTI  Clinical Impression  Pt received in bed, and was agreeable to PT evaluation.  She has difficulty answering PLOF questions, but it seems that she does not use any type of assistive device for ambulation at baseline, and she requires assistance for dressing and bathing.  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  During PT evaluation today she was Mod (I) for supine<>sit, supervision for sit<>stand and min guard to supervision for gait x 46500ft.  Pt is at her baseline level of mobility.  Recommend that she return back to her group home.  No further skilled PT indicated at this time, therefore, will sign off.     Follow Up Recommendations No PT follow up    Equipment Recommendations  None recommended by PT    Recommendations for Other Services       Precautions / Restrictions Precautions Precautions: None      Mobility  Bed Mobility Overal bed mobility: Modified Independent                Transfers Overall transfer level: Modified independent Equipment used: None                Ambulation/Gait Ambulation/Gait assistance: Supervision;Min guard Ambulation Distance (Feet): 400 Feet Assistive device: None Gait Pattern/deviations: Step-through pattern;WFL(Within Functional Limits)        Stairs             Wheelchair Mobility    Modified Rankin (Stroke Patients Only)       Balance Overall balance assessment: Needs assistance   Sitting balance-Leahy Scale: Normal       Standing balance-Leahy Scale: Good                               Pertinent Vitals/Pain Pain Assessment: No/denies pain    Home Living Family/patient expects to be discharged to:: Group home   Available Help at Discharge: Available 24 hours/day Type of Home: Group Home           Additional Comments: Pt is unable to answer questions regarding home set up.    Prior Function Level of Independence: Needs assistance   Gait / Transfers Assistance Needed: supervision  ADL's / Homemaking Assistance Needed: assistance with dressing and bathing.    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."      Hand Dominance        Extremity/Trunk Assessment   Upper Extremity Assessment Upper Extremity Assessment: Overall WFL for tasks assessed    Lower Extremity Assessment Lower Extremity Assessment: Overall WFL for tasks assessed       Communication   Communication: HOH  Cognition Arousal/Alertness: Awake/alert Behavior During Therapy: WFL for tasks assessed/performed Overall Cognitive Status: History of cognitive impairments - at baseline  General Comments      Exercises     Assessment/Plan    PT Assessment Patent does not need any further PT services  PT Problem List            PT Treatment Interventions      PT Goals (Current goals can be found in the Care Plan section)  Acute Rehab PT Goals PT Goal Formulation: All assessment and education complete, DC therapy    Frequency     Barriers to discharge        Co-evaluation               End of Session Equipment Utilized During Treatment: Gait belt Activity Tolerance: Patient tolerated treatment well Patient left: in bed Nurse Communication: Mobility status  (mobility sheet left in pt's room. )    Functional Assessment Tool Used: [employer] AM-PAC "6-clicks"  Functional Limitation: Mobility: Walking and moving around Mobility: Walking and Moving Around Current Status [phone](G8978): At least 1 percent but less than 20 percent impaired, limited or restricted Mobility: Walking and Moving Around Goal Status [phone](G8979): At least 1 percent but less than 20 percent impaired, limited or restricted Mobility: Walking and Moving Around Discharge Status [phone](G8980): At least 1 percent but less than 20 percent impaired, limited or restricted    Time: 1442-1500 PT Time Calculation (min) (ACUTE ONLY): 18 min   Charges:   PT Evaluation $PT Eval Low Complexity: 1 Procedure     PT G Codes:   PT G-Codes **NOT FOR INPATIENT CLASS** Functional Assessment Tool Used: [employer] AM-PAC "6-clicks"  Functional Limitation: Mobility: Walking and moving around Mobility: Walking and Moving Around Current Status [phone](G8978): At least 1 percent but less than 20 percent impaired, limited or restricted Mobility: Walking and Moving Around Goal Status [phone](G8979): At least 1 percent but less than 20 percent impaired, limited or restricted Mobility: Walking and Moving Around Discharge Status [phone](G8980): At least 1 percent but less than 20 percent impaired, limited or restricted    Beth Mickell Birdwell, PT, DPT X: [phone]

## 2016-10-25 LAB — BASIC METABOLIC PANEL
Anion gap: 4 — ABNORMAL LOW (ref 5–15)
BUN: 7 mg/dL (ref 6–20)
CO2: 24 mmol/L (ref 22–32)
Calcium: 8.7 mg/dL — ABNORMAL LOW (ref 8.9–10.3)
Chloride: 109 mmol/L (ref 101–111)
Creatinine, Ser: 0.87 mg/dL (ref 0.44–1.00)
GFR calc Af Amer: >60 mL/min (ref >60)
GFR calc non Af Amer: >60 mL/min (ref >60)
Glucose, Bld: 216 mg/dL — ABNORMAL HIGH (ref 65–99)
Potassium: 4.2 mmol/L (ref 3.5–5.1)
Sodium: 137 mmol/L (ref 135–145)

## 2016-10-25 LAB — CBC WITH DIFFERENTIAL/PLATELET
Basophils Absolute: 0 10*3/uL (ref 0.0–0.1)
Basophils Relative: 0 %
Eosinophils Absolute: 0 10*3/uL (ref 0.0–0.7)
Eosinophils Relative: 0 %
HCT: 37.6 % (ref 36.0–46.0)
Hemoglobin: 11.8 g/dL — ABNORMAL LOW (ref 12.0–15.0)
Lymphocytes Relative: 35 %
Lymphs Abs: 0.9 10*3/uL (ref 0.7–4.0)
MCH: 25.8 pg — ABNORMAL LOW (ref 26.0–34.0)
MCHC: 31.4 g/dL (ref 30.0–36.0)
MCV: 82.1 fL (ref 78.0–100.0)
Monocytes Absolute: 0.4 10*3/uL (ref 0.1–1.0)
Monocytes Relative: 16 %
Neutro Abs: 1.3 10*3/uL — ABNORMAL LOW (ref 1.7–7.7)
Neutrophils Relative %: 49 %
Platelets: 152 10*3/uL (ref 150–400)
RBC: 4.58 MIL/uL (ref 3.87–5.11)
RDW: 16.1 % — ABNORMAL HIGH (ref 11.5–15.5)
WBC: 2.6 10*3/uL — ABNORMAL LOW (ref 4.0–10.5)

## 2016-10-25 LAB — URINE CULTURE: Culture: >=100000 — AB

## 2016-10-25 LAB — GLUCOSE, CAPILLARY
Glucose-Capillary: 210 mg/dL — ABNORMAL HIGH (ref 65–99)
Glucose-Capillary: 268 mg/dL — ABNORMAL HIGH (ref 65–99)

## 2016-10-25 MED ORDER — CIPROFLOXACIN HCL 500 MG PO TABS: 500.0000 mg | ORAL_TABLET | Freq: Two times a day (BID) | ORAL | 0 refills | Status: DC

## 2016-10-25 NOTE — Clinical Social Work Note (Signed)
Pt d/c today back [facility]. Facility aware and agreeable. CSW notified [facility] by voicemail. Facility to pick up pt this afternoon.   Derenda FennelKara Avonlea Sima, LCSW [phone]

## 2016-10-25 NOTE — Clinical Social Work Note (Addendum)
Clinical Social Work Assessment  Patient Details  Name: Ashley HolsteinRowena Blanchard MRN: [id number] Date of Birth: [birth year]  Date of referral:  10/23/16               Reason for consult:  [facility]                Permission sought to share information with:    Permission granted to share information::     Name::        Agency::     Relationship::     Contact Information:  Message left for patient' sister, Ashley Blanchard  Housing/Transportation Living arrangements for the past 2 months:  [facility] Source of Information:  Facility Patient Interpreter Needed:  None Criminal Activity/Legal Involvement Pertinent to Current Situation/Hospitalization:  No - Comment as needed Significant Relationships:  Adult Children Lives with:  Facility Resident Do you feel safe going back to the place where you live?  Yes Need for family participation in patient care:  Yes (Comment)  Care giving concerns:  None identified.   Social Worker assessment / plan:  CSW [facility] with Patent [facility]Judy at [facility]. Patient has been at the facility since March, ambulates independently and staff provides assistance with ADLs. She can return to the facility at discharge. Patient has good family support. Message was left for Ashley Blanchard requesting return contact.   Employment status:  Disabled (Comment on whether or not currently receiving Disability) Insurance information:  Medicare PT Recommendations:  Not assessed at this time Information / Referral to community resources:     Patient/Family's Response to care:  Message left for family.   Patient/Family's Understanding of and Emotional Response to Diagnosis, Current Treatment, and Prognosis:  Message left for family.   Emotional Assessment Appearance:  Appears stated age Attitude/Demeanor/Rapport:  Unable to Assess Affect (typically observed):  Unable to Assess Orientation:  Oriented to Self, Oriented to Place Alcohol / Substance use:  Not  Applicable Psych involvement (Current and /or in the community):  No (Comment)  Discharge Needs  Concerns to be addressed:  No discharge needs identified Readmission within the last 30 days:  No Current discharge risk:  None Barriers to Discharge:  No Barriers Identified   Annice NeedySettle, Wise Fees D, LCSW 10/25/2016, 10:10 AM

## 2016-10-25 NOTE — Progress Notes (Signed)
Pharmacy Antibiotic Note  Ashley Blanchard is a 50 y.o. female admitted on 10/22/2016 with sepsis due to UTI  Pharmacy has been consulted for Cefepime dosing.  EColi isolate sensitive to cephs.  Plan: Cefepime 2gm IV every 12 hours  Monitor labs, micro and vitals.  Deescalate ABX when deemed clinically appropriate.   Height: 5\' 1"  (154.9 cm) Weight: 149 lb 11.1 oz (67.9 kg) IBW/kg (Calculated) : 47.8  Temp (24hrs), Avg:98.2 F (36.8 C), Min:97.7 F (36.5 C), Max:98.7 F (37.1 C)   Recent Labs Lab 10/22/16 1345 10/22/16 1354 10/22/16 1636 10/22/16 1957 10/23/16 0406 10/24/16 0452 10/25/16 0646  WBC 13.3*  --   --   --  7.9 3.8* 2.6*  CREATININE 0.89  --   --   --  0.98 0.90 0.87  LATICACIDVEN  --  3.54* 2.75* 2.6* 0.9  --   --     Estimated Creatinine Clearance: 66.6 mL/min (by C-G formula based on SCr of 0.87 mg/dL).    No Known Allergies  Antimicrobials this admission: Cefepime 12/11 >>   Recent Results (from the past 240 hour(s))  Urine culture     Status: Abnormal   Collection Time: 10/22/16  2:59 PM  Result Value Ref Range Status   Specimen Description URINE, RANDOM  Final   Special Requests NONE  Final   Culture >=100,000 COLONIES/mL ESCHERICHIA COLI (A)  Final   Report Status 10/25/2016 FINAL  Final   Organism ID, Bacteria ESCHERICHIA COLI (A)  Final      Susceptibility   Escherichia coli - MIC*    AMPICILLIN >=32 RESISTANT Resistant     CEFAZOLIN <=4 SENSITIVE Sensitive     CEFTRIAXONE <=1 SENSITIVE Sensitive     CIPROFLOXACIN <=0.25 SENSITIVE Sensitive     GENTAMICIN <=1 SENSITIVE Sensitive     IMIPENEM <=0.25 SENSITIVE Sensitive     NITROFURANTOIN <=16 SENSITIVE Sensitive     TRIMETH/SULFA <=20 SENSITIVE Sensitive     AMPICILLIN/SULBACTAM >=32 RESISTANT Resistant     PIP/TAZO <=4 SENSITIVE Sensitive     Extended ESBL NEGATIVE Sensitive     * >=100,000 COLONIES/mL ESCHERICHIA COLI  C difficile quick scan w PCR reflex     Status: None   Collection  Time: 10/22/16  3:29 PM  Result Value Ref Range Status   C Diff antigen NEGATIVE NEGATIVE Final   C Diff toxin NEGATIVE NEGATIVE Final   C Diff interpretation No C. difficile detected.  Final  Blood culture (routine x 2)     Status: None (Preliminary result)   Collection Time: 10/22/16  4:30 PM  Result Value Ref Range Status   Specimen Description BLOOD LEFT HAND  Final   Special Requests BOTTLES DRAWN AEROBIC AND ANAEROBIC 6CC EACH  Final   Culture NO GROWTH 3 DAYS  Final   Report Status PENDING  Incomplete  MRSA PCR Screening     Status: None   Collection Time: 10/22/16  5:48 PM  Result Value Ref Range Status   MRSA by PCR NEGATIVE NEGATIVE Final    Comment:        The GeneXpert MRSA Assay (FDA approved for NASAL specimens only), is one component of a comprehensive MRSA colonization surveillance program. It is not intended to diagnose MRSA infection nor to guide or monitor treatment for MRSA infections.   Blood culture (routine x 2)     Status: None (Preliminary result)   Collection Time: 10/22/16  8:08 PM  Result Value Ref Range Status   Specimen Description BLOOD  LEFT WRIST  Final   Special Requests BOTTLES DRAWN AEROBIC AND ANAEROBIC [facility]6CC EACH  Final   Culture PENDING  Incomplete   Report Status PENDING  Incomplete   Thank you for allowing pharmacy to be a part of this patient's care.  Valrie HartHall, Vishaal Strollo A 10/25/2016 12:04 PM

## 2016-10-25 NOTE — Discharge Summary (Signed)
Physician Discharge Summary  Ashley Blanchard ZOX:[id number] DOB: 10-06-1966 DOA: 10/22/2016  PCP: Louie Boston, MD  Admit date: 10/22/2016 Discharge date: 10/25/2016  Admitted From: SNF Disposition:  SNF  Recommendations for Outpatient Follow-up:  1. Follow up with PCP in 1-2 weeks 2. Please obtain BMP/CBC in one week   Discharge Condition: stable CODE STATUS: full code Diet recommendation: Heart Healthy / Carb Modified   Brief/Interim Summary: 50 y.o.femalewith medical history significant of chronic anemia, type 2 diabetes, hearing loss, hyperlipidemia, hypertension, mild mental retardation which is coming from [facility] due to altered mental status associated with nausea and vomiting and diarrhea.  Patient diagnosed with sepsis secondary to urinary tract source  Discharge Diagnoses:  Principal Problem:   Sepsis secondary to UTI [facility]) Active Problems:   Hyperlipidemia   Essential hypertension, benign   Chronic anemia   Lactic acidosis   Type 2 diabetes mellitus (HCC)   Hypomagnesemia    Sepsis secondary to UTI (HCC) - Initially treated with cefepime  - blood cultures have not shown any growth yet, but urine culture E coli. Based on sensitivities, she will be treated with a course of ciprofloxacin - Leukocytosis has resolved - fevers resolved    Hyperlipidemia - Continue with Lipitor, stable    Essential hypertension, benign.   - Blood pressures stable.   - continue diltiazem  - restart ramipril on discharge    Lactic acidosis - resolved with improvement in condition and IVF rehydration    Type 2 diabetes mellitus (HCC) - continue basal insulin and metformin on discharge. Blood sugars stable  Discharge Instructions  Discharge Instructions    Diet - low sodium heart healthy    Complete by:  As directed    Increase activity slowly    Complete by:  As directed        Medication List    TAKE these medications   aspirin EC 81 MG  tablet Take 81 mg by mouth daily.   atorvastatin 20 MG tablet Commonly known as:  LIPITOR Take 20 mg by mouth daily.   ciprofloxacin 500 MG tablet Commonly known as:  CIPRO Take 1 tablet (500 mg total) by mouth 2 (two) times daily. For 5 more days   diltiazem 180 MG 24 hr capsule Commonly known as:  DILACOR XR Take 180 mg by mouth daily.   Insulin Glargine 100 UNIT/ML Solostar Pen Commonly known as:  LANTUS SOLOSTAR Inject 15 Units into the skin daily with breakfast.   latanoprost 0.005 % ophthalmic solution Commonly known as:  XALATAN 1 drop at bedtime.   metFORMIN 500 MG tablet Commonly known as:  GLUCOPHAGE Take 1 tablet (500 mg total) by mouth 2 (two) times daily with a meal.   ramipril 5 MG capsule Commonly known as:  ALTACE Take 5 mg by mouth daily.   traZODone 50 MG tablet Commonly known as:  DESYREL Take 50 mg by mouth at bedtime.       No Known Allergies  Consultations:     Procedures/Studies: Dg Chest 2 View  Result Date: 10/22/2016 CLINICAL DATA:  Episode of confusion and aphasia this morning. History of cognitive impairment. History of diabetes, hypertension, and hyperlipidemia. EXAM: CHEST  2 VIEW COMPARISON:  None. FINDINGS: The lungs are hypoinflated. The interstitial markings are coarse at the bases due to crack vascular crowding. There is no alveolar infiltrate or pleural effusion. The cardiac silhouette and pulmonary vascularity are normal. The bony thorax is unremarkable. IMPRESSION: Mild bibasilar subsegmental atelectasis. No pneumonia nor  other acute cardiopulmonary abnormality. Electronically Signed   By: David  [place] M.D.   On: 10/22/2016 14:58   Ct Head Wo Contrast  Result Date: 10/22/2016 CLINICAL DATA:  Altered mental status with acute onset aphasia EXAM: CT HEAD WITHOUT CONTRAST TECHNIQUE: Contiguous axial images were obtained from the base of the skull through the vertex without intravenous contrast. COMPARISON:  None. FINDINGS:  Brain: The ventricles are normal in size and configuration. There is no intracranial mass, hemorrhage, extra-axial fluid collection, or midline shift. There is minimal small vessel disease in the centra semiovale bilaterally. Elsewhere gray-white compartments are normal. No acute infarct is demonstrable. Vascular: There is no hyperdense vessel. There is no vascular calcification evident. Skull: The bony calvarium appears intact. Sinuses/Orbits: Visualized paranasal sinuses are clear. Visualized orbits appear symmetric bilaterally. Other: Mastoid air cells are clear. IMPRESSION: Minimal periventricular small vessel disease. No acute infarct evident. No intracranial mass, hemorrhage, or extra-axial fluid collection. Electronically Signed   By: Bretta Bang III M.D.   On: 10/22/2016 15:33       Subjective: Doing well, no shortness of breath  Discharge Exam: Vitals:   10/25/16 0635 10/25/16 0916  BP: (!) 160/78 (!) 160/80  Pulse: 80   Resp: 16   Temp: 97.7 F (36.5 C)    Vitals:   10/24/16 1503 10/24/16 2140 10/25/16 0635 10/25/16 0916  BP: 139/62 120/65 (!) 160/78 (!) 160/80  Pulse: 87 89 80   Resp: 16 15 16    Temp: 98.1 F (36.7 C) 98.7 F (37.1 C) 97.7 F (36.5 C)   TempSrc: Oral Oral Oral   SpO2: 100% 99% 94%   Weight:      Height:        General: Pt is alert, awake, not in acute distress Cardiovascular: RRR, S1/S2 +, no rubs, no gallops Respiratory: CTA bilaterally, no wheezing, no rhonchi Abdominal: Soft, NT, ND, bowel sounds + Extremities: no edema, no cyanosis    The results of significant diagnostics from this hospitalization (including imaging, microbiology, ancillary and laboratory) are listed below for reference.     Microbiology: Recent Results (from the past 240 hour(s))  Urine culture     Status: Abnormal   Collection Time: 10/22/16  2:59 PM  Result Value Ref Range Status   Specimen Description URINE, RANDOM  Final   Special Requests NONE  Final    Culture >=100,000 COLONIES/mL ESCHERICHIA COLI (A)  Final   Report Status 10/25/2016 FINAL  Final   Organism ID, Bacteria ESCHERICHIA COLI (A)  Final      Susceptibility   Escherichia coli - MIC*    AMPICILLIN >=32 RESISTANT Resistant     CEFAZOLIN <=4 SENSITIVE Sensitive     CEFTRIAXONE <=1 SENSITIVE Sensitive     CIPROFLOXACIN <=0.25 SENSITIVE Sensitive     GENTAMICIN <=1 SENSITIVE Sensitive     IMIPENEM <=0.25 SENSITIVE Sensitive     NITROFURANTOIN <=16 SENSITIVE Sensitive     TRIMETH/SULFA <=20 SENSITIVE Sensitive     AMPICILLIN/SULBACTAM >=32 RESISTANT Resistant     PIP/TAZO <=4 SENSITIVE Sensitive     Extended ESBL NEGATIVE Sensitive     * >=100,000 COLONIES/mL ESCHERICHIA COLI  C difficile quick scan w PCR reflex     Status: None   Collection Time: 10/22/16  3:29 PM  Result Value Ref Range Status   C Diff antigen NEGATIVE NEGATIVE Final   C Diff toxin NEGATIVE NEGATIVE Final   C Diff interpretation No C. difficile detected.  Final  Blood culture (routine x  2)     Status: None (Preliminary result)   Collection Time: 10/22/16  4:30 PM  Result Value Ref Range Status   Specimen Description BLOOD LEFT HAND  Final   Special Requests BOTTLES DRAWN AEROBIC AND ANAEROBIC 6CC EACH  Final   Culture NO GROWTH 3 DAYS  Final   Report Status PENDING  Incomplete  MRSA PCR Screening     Status: None   Collection Time: 10/22/16  5:48 PM  Result Value Ref Range Status   MRSA by PCR NEGATIVE NEGATIVE Final    Comment:        The GeneXpert MRSA Assay (FDA approved for NASAL specimens only), is one component of a comprehensive MRSA colonization surveillance program. It is not intended to diagnose MRSA infection nor to guide or monitor treatment for MRSA infections.   Blood culture (routine x 2)     Status: None (Preliminary result)   Collection Time: 10/22/16  8:08 PM  Result Value Ref Range Status   Specimen Description BLOOD LEFT WRIST  Final   Special Requests BOTTLES DRAWN  AEROBIC AND ANAEROBIC [facility]6CC EACH  Final   Culture PENDING  Incomplete   Report Status PENDING  Incomplete     Labs: BNP (last 3 results) No results for input(s): BNP in the last 8760 hours. Basic Metabolic Panel:  Recent Labs Lab 10/22/16 1345 10/22/16 1354 10/23/16 0406 10/24/16 0452 10/25/16 0646  NA 136  --  141 139 137  K 4.1  --  3.8 4.2 4.2  CL 106  --  112* 112* 109  CO2 21*  --  21* 21* 24  GLUCOSE 139*  --  52* 176* 216*  BUN 19  --  22* 13 7  CREATININE 0.89  --  0.98 0.90 0.87  CALCIUM 8.4*  --  7.6* 7.9* 8.7*  MG  --  1.1*  --  2.2  --   PHOS  --  2.9  --   --   --    Liver Function Tests:  Recent Labs Lab 10/22/16 1345  AST 38  ALT 26  ALKPHOS 93  BILITOT 0.3  PROT 5.5*  ALBUMIN 2.6*    Recent Labs Lab 10/22/16 1345  LIPASE 34   No results for input(s): AMMONIA in the last 168 hours. CBC:  Recent Labs Lab 10/22/16 1345 10/23/16 0406 10/24/16 0452 10/25/16 0646  WBC 13.3* 7.9 3.8* 2.6*  NEUTROABS 12.4* 7.3 2.5 1.3*  HGB 11.8* 10.4* 11.6* 11.8*  HCT 38.0 33.4* 38.4 37.6  MCV 82.3 81.9 82.6 82.1  PLT 236 223 176 152   Cardiac Enzymes: No results for input(s): CKTOTAL, CKMB, CKMBINDEX, TROPONINI in the last 168 hours. BNP: Invalid input(s): POCBNP CBG:  Recent Labs Lab 10/24/16 1133 10/24/16 1623 10/24/16 2139 10/25/16 0750 10/25/16 1118  GLUCAP 164* 169* 251* 210* 268*   D-Dimer No results for input(s): DDIMER in the last 72 hours. Hgb A1c No results for input(s): HGBA1C in the last 72 hours. Lipid Profile No results for input(s): CHOL, HDL, LDLCALC, TRIG, CHOLHDL, LDLDIRECT in the last 72 hours. Thyroid function studies No results for input(s): TSH, T4TOTAL, T3FREE, THYROIDAB in the last 72 hours.  Invalid input(s): FREET3 Anemia work up No results for input(s): VITAMINB12, FOLATE, FERRITIN, TIBC, IRON, RETICCTPCT in the last 72 hours. Urinalysis    Component Value Date/Time   COLORURINE YELLOW 10/22/2016 1308    APPEARANCEUR CLEAR 10/22/2016 1308   LABSPEC 1.013 10/22/2016 1308   PHURINE 5.0 10/22/2016 1308  GLUCOSEU NEGATIVE 10/22/2016 1308   HGBUR MODERATE (A) 10/22/2016 1308   BILIRUBINUR NEGATIVE 10/22/2016 1308   KETONESUR NEGATIVE 10/22/2016 1308   PROTEINUR NEGATIVE 10/22/2016 1308   NITRITE POSITIVE (A) 10/22/2016 1308   LEUKOCYTESUR NEGATIVE 10/22/2016 1308   Sepsis Labs Invalid input(s): PROCALCITONIN,  WBC,  LACTICIDVEN Microbiology Recent Results (from the past 240 hour(s))  Urine culture     Status: Abnormal   Collection Time: 10/22/16  2:59 PM  Result Value Ref Range Status   Specimen Description URINE, RANDOM  Final   Special Requests NONE  Final   Culture >=100,000 COLONIES/mL ESCHERICHIA COLI (A)  Final   Report Status 10/25/2016 FINAL  Final   Organism ID, Bacteria ESCHERICHIA COLI (A)  Final      Susceptibility   Escherichia coli - MIC*    AMPICILLIN >=32 RESISTANT Resistant     CEFAZOLIN <=4 SENSITIVE Sensitive     CEFTRIAXONE <=1 SENSITIVE Sensitive     CIPROFLOXACIN <=0.25 SENSITIVE Sensitive     GENTAMICIN <=1 SENSITIVE Sensitive     IMIPENEM <=0.25 SENSITIVE Sensitive     NITROFURANTOIN <=16 SENSITIVE Sensitive     TRIMETH/SULFA <=20 SENSITIVE Sensitive     AMPICILLIN/SULBACTAM >=32 RESISTANT Resistant     PIP/TAZO <=4 SENSITIVE Sensitive     Extended ESBL NEGATIVE Sensitive     * >=100,000 COLONIES/mL ESCHERICHIA COLI  C difficile quick scan w PCR reflex     Status: None   Collection Time: 10/22/16  3:29 PM  Result Value Ref Range Status   C Diff antigen NEGATIVE NEGATIVE Final   C Diff toxin NEGATIVE NEGATIVE Final   C Diff interpretation No C. difficile detected.  Final  Blood culture (routine x 2)     Status: None (Preliminary result)   Collection Time: 10/22/16  4:30 PM  Result Value Ref Range Status   Specimen Description BLOOD LEFT HAND  Final   Special Requests BOTTLES DRAWN AEROBIC AND ANAEROBIC 6CC EACH  Final   Culture NO GROWTH 3 DAYS   Final   Report Status PENDING  Incomplete  MRSA PCR Screening     Status: None   Collection Time: 10/22/16  5:48 PM  Result Value Ref Range Status   MRSA by PCR NEGATIVE NEGATIVE Final    Comment:        The GeneXpert MRSA Assay (FDA approved for NASAL specimens only), is one component of a comprehensive MRSA colonization surveillance program. It is not intended to diagnose MRSA infection nor to guide or monitor treatment for MRSA infections.   Blood culture (routine x 2)     Status: None (Preliminary result)   Collection Time: 10/22/16  8:08 PM  Result Value Ref Range Status   Specimen Description BLOOD LEFT WRIST  Final   Special Requests BOTTLES DRAWN AEROBIC AND ANAEROBIC [facility]6CC EACH  Final   Culture PENDING  Incomplete   Report Status PENDING  Incomplete     Time coordinating discharge: Over 30 minutes  SIGNED:   Erick BlinksMEMON,Kirstine Jacquin, MD  [facility] 10/25/2016, 1:00 PM Pager   If 7PM-7AM, please contact night-coverage [email or web address] Password [email or web address]

## 2016-10-25 NOTE — Progress Notes (Signed)
Pt's IV catheter removed and intact. Pt's IV site clean dry and intact. Discharge instructions including medications were discussed with Paula ComptonJuanita Price, [facility]. Report was also called and given to Elease EtienneHeather Slaughter at [facility]. All questions were answered and no further questions at this time. Pt in stable condition at time of discharge and escorted by nurse tech. Pt transported by Paula ComptonJuanita Price (owner/manager [facility]).

## 2016-10-25 NOTE — NC FL2 (Deleted)
[place] MEDICAID FL2 LEVEL OF CARE SCREENING TOOL     IDENTIFICATION  Patient Name: Ashley Blanchard Birthdate: [birth year] Sex: female Admission Date (Current Location): 10/22/2016  [place]:  [place] [id number] P Facility and Address:  [facility],  [address], [place] [id number]      Provider Number: [id number]  Attending Physician Name and Address:  Erick BlinksJehanzeb Memon, MD  Relative Name and Phone Number:       Current Level of Care: Hospital Recommended Level of Care: Assisted Living Facility [facility] Prior Approval Number:    Date Approved/Denied:   PASRR Number:    [facility]  [facility]    Current Diagnoses: Patient Active Problem List   Diagnosis Date Noted  . Sepsis secondary to UTI (HCC) 10/22/2016  . Chronic anemia 10/22/2016  . Lactic acidosis 10/22/2016  . Type 2 diabetes mellitus (HCC) 10/22/2016  . Hypomagnesemia 10/22/2016  . Uncontrolled type 2 diabetes mellitus with complication, without long-term current use of insulin (HCC) 08/06/2016  . Hyperlipidemia 08/06/2016  . Essential hypertension, benign 08/06/2016    Orientation RESPIRATION BLADDER Height & Weight     Self  Normal Incontinent Weight: 149 lb 11.1 oz (67.9 kg) Height:  5\' 1"  (154.9 cm)  BEHAVIORAL SYMPTOMS/MOOD NEUROLOGICAL BOWEL NUTRITION STATUS      Continent Diet (heart healthy/carb modified)  AMBULATORY STATUS COMMUNICATION OF NEEDS Skin   Extensive Assist   Normal                       Personal Care Assistance Level of Assistance  Bathing, Feeding, Dressing Bathing Assistance: Maximum assistance Feeding assistance: Limited assistance Dressing Assistance: Maximum assistance     Functional Limitations Info  Sight, Hearing, Speech Sight Info: Adequate Hearing Info: Adequate Speech Info: Adequate    SPECIAL CARE FACTORS FREQUENCY                       Contractures Contractures Info: Not present     Additional Factors Info  Code Status, Psychotropic Code Status Info: Full code   Psychotropic Info: Desyrel         Current Medications (10/25/2016):  This is the current hospital active medication list Current Facility-Administered Medications  Medication Dose Route Frequency Provider Last Rate Last Dose  . 0.9 %  sodium chloride infusion   Intravenous Continuous Bobette Moavid Manuel Ortiz, MD 100 mL/hr at 10/24/16 2323    . aspirin EC tablet 81 mg  81 mg Oral Daily Bobette Moavid Manuel Ortiz, MD   81 mg at 10/25/16 [id number]  . atorvastatin (LIPITOR) tablet 20 mg  20 mg Oral Daily Bobette Moavid Manuel Ortiz, MD   20 mg at 10/25/16 0916  . ceFEPIme (MAXIPIME) 2 g in dextrose 5 % 50 mL IVPB  2 g Intravenous Q12H Bobette Moavid Manuel Ortiz, MD   2 g at 10/25/16 0404  . diltiazem (CARDIZEM CD) 24 hr capsule 180 mg  180 mg Oral Daily Bobette Moavid Manuel Ortiz, MD   180 mg at 10/25/16 0916  . enoxaparin (LOVENOX) injection 40 mg  40 mg Subcutaneous Q24H Bobette Moavid Manuel Ortiz, MD   40 mg at 10/24/16 1736  . insulin aspart (novoLOG) injection 0-9 Units  0-9 Units Subcutaneous TID WC Bobette Moavid Manuel Ortiz, MD   5 Units at 10/25/16 1203  . insulin glargine (LANTUS) injection 15 Units  15 Units Subcutaneous Q breakfast Bobette Moavid Manuel Ortiz, MD   15 Units at 10/25/16 0935  . latanoprost (XALATAN)  0.005 % ophthalmic solution 1 drop  1 drop Both Eyes QHS Bobette Moavid Manuel Ortiz, MD   1 drop at 10/25/16 0012  . ondansetron (ZOFRAN) tablet 4 mg  4 mg Oral Q6H PRN Bobette Moavid Manuel Ortiz, MD       Or  . ondansetron [facility](ZOFRAN) injection 4 mg  4 mg Intravenous Q6H PRN Bobette Moavid Manuel Ortiz, MD      . traZODone (DESYREL) tablet 50 mg  50 mg Oral QHS Bobette Moavid Manuel Ortiz, MD   50 mg at 10/24/16 2320     Discharge Medications: Please see discharge summary for a list of discharge medications.  Relevant Imaging Results:  Relevant Lab Results:   Additional Information SSN [id number]    Derenda FennelStultz, Berdie Malter [place], [place]CS[place] [phone]

## 2016-10-25 NOTE — [place] FL2 (Signed)
[place] MEDICAID FL2 LEVEL OF CARE SCREENING TOOL     IDENTIFICATION  Patient Name: Ashley Blanchard Birthdate: [birth year] Sex: female Admission Date (Current Location): 10/22/2016  [place]:  [place] [id number] P Facility and Address:  [facility],  [address], [place] [id number]      Provider Number: [id number]  Attending Physician Name and Address:  Erick BlinksJehanzeb Memon, MD  Relative Name and Phone Number:       Current Level of Care: Hospital Recommended Level of Care: Assisted [facility] Facility [facility] Prior Approval Number:    Date Approved/Denied:   PASRR Number:    [facility]:  [facility]    Current Diagnoses: Patient Active Problem List   Diagnosis Date Noted  . Sepsis secondary to UTI (HCC) 10/22/2016  . Chronic anemia 10/22/2016  . Lactic acidosis 10/22/2016  . Type 2 diabetes mellitus (HCC) 10/22/2016  . Hypomagnesemia 10/22/2016  . Uncontrolled type 2 diabetes mellitus with complication, without long-term current use of insulin (HCC) 08/06/2016  . Hyperlipidemia 08/06/2016  . Essential hypertension, benign 08/06/2016    Orientation RESPIRATION BLADDER Height & Weight     Self  Normal Incontinent Weight: 149 lb 11.1 oz (67.9 kg) Height:  5\' 1"  (154.9 cm)  BEHAVIORAL SYMPTOMS/MOOD NEUROLOGICAL BOWEL NUTRITION STATUS      Continent Diet (heart healthy/carb modified)  AMBULATORY STATUS COMMUNICATION OF NEEDS Skin   Extensive Assist   Normal                       Personal Care Assistance Level of Assistance  Bathing, Feeding, Dressing Bathing Assistance: Maximum assistance Feeding assistance: Limited assistance Dressing Assistance: Maximum assistance     Functional Limitations Info  Sight, Hearing, Speech Sight Info: Adequate Hearing Info: Adequate Speech Info: Adequate    SPECIAL CARE FACTORS FREQUENCY                       Contractures Contractures Info: Not present     Additional Factors Info  Code Status, Psychotropic Code Status Info: Full code   Psychotropic Info: Desyrel         Current Medications (10/25/2016):  This is the current hospital active medication list Current Facility-Administered Medications  Medication Dose Route Frequency Provider Last Rate Last Dose  . 0.9 %  sodium chloride infusion   Intravenous Continuous Bobette Moavid Manuel Ortiz, MD 100 mL/hr at 10/24/16 2323    . aspirin EC tablet 81 mg  81 mg Oral Daily Bobette Moavid Manuel Ortiz, MD   81 mg at 10/25/16 [id number]  . atorvastatin (LIPITOR) tablet 20 mg  20 mg Oral Daily Bobette Moavid Manuel Ortiz, MD   20 mg at 10/25/16 0916  . ceFEPIme (MAXIPIME) 2 g in dextrose 5 % 50 mL IVPB  2 g Intravenous Q12H Bobette Moavid Manuel Ortiz, MD   2 g at 10/25/16 0404  . diltiazem (CARDIZEM CD) 24 hr capsule 180 mg  180 mg Oral Daily Bobette Moavid Manuel Ortiz, MD   180 mg at 10/25/16 0916  . enoxaparin (LOVENOX) injection 40 mg  40 mg Subcutaneous Q24H Bobette Moavid Manuel Ortiz, MD   40 mg at 10/24/16 1736  . insulin aspart (novoLOG) injection 0-9 Units  0-9 Units Subcutaneous TID WC Bobette Moavid Manuel Ortiz, MD   5 Units at 10/25/16 1203  . insulin glargine (LANTUS) injection 15 Units  15 Units Subcutaneous Q breakfast Bobette Moavid Manuel Ortiz, MD   15 Units at 10/25/16 0935  . latanoprost (XALATAN)  0.005 % ophthalmic solution 1 drop  1 drop Both Eyes QHS Bobette Moavid Manuel Ortiz, MD   1 drop at 10/25/16 0012  . ondansetron (ZOFRAN) tablet 4 mg  4 mg Oral Q6H PRN Bobette Moavid Manuel Ortiz, MD       Or  . ondansetron [facility](ZOFRAN) injection 4 mg  4 mg Intravenous Q6H PRN Bobette Moavid Manuel Ortiz, MD      . traZODone (DESYREL) tablet 50 mg  50 mg Oral QHS Bobette Moavid Manuel Ortiz, MD   50 mg at 10/24/16 2320     Discharge Medications:   Medication List    TAKE these medications   aspirin EC 81 MG tablet Take 81 mg by mouth daily.  atorvastatin 20 MG tablet Commonly known as:  LIPITOR Take 20 mg by mouth daily.  ciprofloxacin 500 MG tablet Commonly known as:   CIPRO Take 1 tablet (500 mg total) by mouth 2 (two) times daily. For 5 more days  diltiazem 180 MG 24 hr capsule Commonly known as:  DILACOR XR Take 180 mg by mouth daily.  Insulin Glargine 100 UNIT/ML Solostar Pen Commonly known as:  LANTUS SOLOSTAR Inject 15 Units into the skin daily with breakfast.  latanoprost 0.005 % ophthalmic solution Commonly known as:  XALATAN 1 drop at bedtime.  metFORMIN 500 MG tablet Commonly known as:  GLUCOPHAGE Take 1 tablet (500 mg total) by mouth 2 (two) times daily with a meal.  ramipril 5 MG capsule Commonly known as:  ALTACE Take 5 mg by mouth daily.  traZODone 50 MG tablet Commonly known as:  DESYREL Take 50 mg by mouth at bedtime.     Relevant Imaging Results:  Relevant Lab Results:   Additional Information SSN [id number]    Derenda FennelStultz, Zacchary Pompei LodaShanaberger, [place]CS[place] [phone]

## 2016-10-27 LAB — CULTURE, BLOOD (ROUTINE X 2): Culture: NO GROWTH

## 2016-10-31 ENCOUNTER — Telehealth: Payer: Self-pay

## 2016-10-31 LAB — CULTURE, BLOOD (ROUTINE X 2): Culture: NO GROWTH

## 2016-11-01 NOTE — Telephone Encounter (Signed)
Insulin order faxed to facility

## 2016-11-07 ENCOUNTER — Ambulatory Visit (INDEPENDENT_AMBULATORY_CARE_PROVIDER_SITE_OTHER): Payer: Medicare Other | Admitting: "Endocrinology

## 2016-11-07 ENCOUNTER — Encounter: Payer: Self-pay | Admitting: "Endocrinology

## 2016-11-07 VITALS — BP 134/82 | HR 91 | Wt 141.0 lb

## 2016-11-07 DIAGNOSIS — E782 Mixed hyperlipidemia: Secondary | ICD-10-CM | POA: Diagnosis not present

## 2016-11-07 DIAGNOSIS — E118 Type 2 diabetes mellitus with unspecified complications: Secondary | ICD-10-CM | POA: Diagnosis not present

## 2016-11-07 DIAGNOSIS — IMO0002 Reserved for concepts with insufficient information to code with codable children: Secondary | ICD-10-CM

## 2016-11-07 DIAGNOSIS — E1165 Type 2 diabetes mellitus with hyperglycemia: Secondary | ICD-10-CM

## 2016-11-07 DIAGNOSIS — I10 Essential (primary) hypertension: Secondary | ICD-10-CM

## 2016-11-07 MED ORDER — INSULIN GLARGINE 100 UNIT/ML SOLOSTAR PEN: 20.0000 [IU] | PEN_INJECTOR | Freq: Every day | SUBCUTANEOUS | 2 refills | Status: DC

## 2016-11-07 NOTE — Progress Notes (Signed)
Subjective:    Patient ID: Ashley Blanchard, female    DOB: 02/09/1966. Patient is being seen in Follow-up for management of diabetes requested by  TAPPER,DAVID B, MD  Past Medical History:  Diagnosis Date  . Chronic anemia   . Diabetes mellitus, type II (HCC)   . Hearing loss   . Hyperlipidemia   . Hypertension   . Mild mental retardation    Past Surgical History:  Procedure Laterality Date  . CATARACT EXTRACTION    . RETINAL DETACHMENT SURGERY     Social History   Social History  . Marital status: Single    Spouse name: N/A  . Number of children: N/A  . Years of education: N/A   Social History Main Topics  . Smoking status: Never Smoker  . Smokeless tobacco: Never Used  . Alcohol use No  . Drug use: No  . Sexual activity: Not Asked   Other Topics Concern  . None   Social History Narrative  . None   Outpatient Encounter Prescriptions as of 11/07/2016  Medication Sig  . aspirin EC 81 MG tablet Take 81 mg by mouth daily.  Marland Kitchen atorvastatin (LIPITOR) 20 MG tablet Take 20 mg by mouth daily.  . ciprofloxacin (CIPRO) 500 MG tablet Take 1 tablet (500 mg total) by mouth 2 (two) times daily. For 5 more days  . diltiazem (DILACOR XR) 180 MG 24 hr capsule Take 180 mg by mouth daily.  . Insulin Glargine (LANTUS SOLOSTAR) 100 UNIT/ML Solostar Pen Inject 20 Units into the skin daily with breakfast.  . latanoprost (XALATAN) 0.005 % ophthalmic solution 1 drop at bedtime.  . metFORMIN (GLUCOPHAGE) 500 MG tablet Take 1 tablet (500 mg total) by mouth 2 (two) times daily with a meal.  . ramipril (ALTACE) 5 MG capsule Take 5 mg by mouth daily.  . traZODone (DESYREL) 50 MG tablet Take 50 mg by mouth at bedtime.  . [DISCONTINUED] Insulin Glargine (LANTUS SOLOSTAR) 100 UNIT/ML Solostar Pen Inject 15 Units into the skin daily with breakfast.   No facility-administered encounter medications on file as of 11/07/2016.    ALLERGIES: No Known Allergies VACCINATION STATUS:  There is no  immunization history on file for this patient.  Diabetes  She presents for her follow-up diabetic visit. She has type 2 diabetes mellitus. Onset time: She is not sure when she was diagnosed with diabetes. Her disease course has been worsening (Her recent A1c is higher at 10.3% in May 2017.). There are no hypoglycemic associated symptoms. Pertinent negatives for hypoglycemia include no confusion, headaches, pallor or seizures. Associated symptoms include fatigue, polydipsia and polyuria. Pertinent negatives for diabetes include no chest pain and no polyphagia. There are no hypoglycemic complications. Symptoms are worsening. There are no diabetic complications. Risk factors for coronary artery disease include diabetes mellitus, hypertension and dyslipidemia. Current diabetic treatment includes oral agent (monotherapy). Compliance with diabetes treatment: She is in a group home due to failure to care for self. Her weight is stable. She is following a generally unhealthy diet. When asked about meal planning, she reported none. She has not had a previous visit with a dietitian. She rarely participates in exercise. Her breakfast blood glucose range is generally >200 mg/dl. Her overall blood glucose range is >200 mg/dl. An ACE inhibitor/angiotensin II receptor blocker is being taken.    Review of Systems  Constitutional: Positive for fatigue. Negative for unexpected weight change.  HENT: Negative for trouble swallowing and voice change.   Eyes: Negative for  visual disturbance.  Respiratory: Negative for cough, shortness of breath and wheezing.   Cardiovascular: Negative for chest pain, palpitations and leg swelling.  Gastrointestinal: Negative for diarrhea, nausea and vomiting.  Endocrine: Positive for polydipsia and polyuria. Negative for cold intolerance, heat intolerance and polyphagia.  Genitourinary: Positive for frequency. Negative for dysuria and flank pain.  Musculoskeletal: Negative for arthralgias  and myalgias.  Skin: Negative for color change, pallor, rash and wound.  Neurological: Negative for seizures and headaches.  Psychiatric/Behavioral: Negative for confusion and suicidal ideas.    Objective:    BP 134/82   Pulse 91   Wt 141 lb (64 kg)   LMP  (LMP Unknown)   BMI 26.64 kg/m   Wt Readings from Last 3 Encounters:  11/07/16 141 lb (64 kg)  10/24/16 149 lb 11.1 oz (67.9 kg)  08/15/16 138 lb (62.6 kg)    Physical Exam  Constitutional:  Light build.  HENT:  Head: Normocephalic and atraumatic.  Eyes: EOM are normal.  Neck: Normal range of motion. Neck supple. No tracheal deviation present. No thyromegaly present.  Cardiovascular: Normal rate and regular rhythm.   Pulmonary/Chest: Effort normal and breath sounds normal.  Abdominal: Soft. Bowel sounds are normal. There is no tenderness. There is no guarding.  Musculoskeletal: Normal range of motion. She exhibits no edema.  Neurological: She is alert. No cranial nerve deficit. Coordination normal.  She is hard of hearing. She has reported mental retardation accompanied by her care manager.  Skin: Skin is warm and dry. No rash noted. No erythema. No pallor.  Psychiatric: She has a normal mood and affect.   03/29/2016 A1c was 10.3%.  Assessment & Plan:   1. Uncontrolled type 2 diabetes mellitus with complication, without long-term current use of insulin (HCC)  - Patient has currently uncontrolled symptomatic type 2 DM Duration unknown,  with most recent A1c of  9.4% improving from 10.3 %.  - Labs show normal renal function.  - She has no reported gross complications from diabetes however patient remains at a high risk for more acute and chronic complications of diabetes which include CAD, CVA, CKD, retinopathy, and neuropathy.  -  She does not understand the high risk she has, these are all discussed with her care manager from the group home.  - I have counseled the patient on diet management by adopting a carbohydrate  restricted/protein rich diet.  - Suggestion is made for patient to avoid simple carbohydrates   from their diet including Cakes , Desserts, Ice Cream,  Soda (  diet and regular) , Sweet Tea , Candies,  Chips, Cookies, Artificial Sweeteners,   and "Sugar-free" Products . This will help patient to have stable blood glucose profile and potentially avoid unintended weight gain.  - I encouraged the patient to switch to  unprocessed or minimally processed complex starch and increased protein intake (animal or plant source), fruits, and vegetables.  - Patient is advised to stick to a routine mealtimes to eat 3 meals  a day and avoid unnecessary snacks ( to snack only to correct hypoglycemia).  - The patient will be scheduled with Norm SaltPenny Crumpton, RDN, CDE for individualized DM education.  - I have approached patient with the following individualized plan to manage diabetes and patient agrees:   - Based on her severe glycemic burden, She will  still require insulin treatment for diabetes . - Since her last visit, her Lantus was increased significantly to 35 units which was causing fasting hypoglycemia. I will proceed  to lower it to 30 units with breakfast associated with monitoring of blood glucose daily before breakfast and at bedtime.  -Patient's care taker is encouraged to call clinic for blood glucose levels less than 70 or above 300 mg /dl.  - I will continue low  -dose metformin 500 mg by mouth twice a day.   - She is not suitable candidate for SGLt2 inhibitors nor incretin therapy. - Patient specific target  A1c;  LDL, HDL, Triglycerides, and  Waist Circumference were discussed in detail.  2) BP/HT : controlled, continue current medications including ACEI/ARB. 3) Lipids/HPL:   control unknown, continue statins. 4)  Weight/Diet: she is light build,  , exercise, and detailed carbohydrates information provided.  5) Chronic Care/Health Maintenance:  -Patient is on ACEI/ARB and Statin medications  and encouraged to continue to follow up with Ophthalmology, Podiatrist at least yearly or according to recommendations, and advised to   stay away from smoking. I have recommended yearly flu vaccine and pneumonia vaccination at least every 5 years; moderate intensity exercise for up to 150 minutes weekly; and  sleep for at least 7 hours a day.  - 30 minutes of time was spent on the care of this patient , 50% of which was applied for counseling on diabetes complications and their preventions.  - Patient to bring meter and  blood glucose logs during her next visit.   - I advised patient to maintain close follow up with TAPPER,DAVID B, MD for primary care needs.  Follow up plan: - Return in about 8 weeks (around 01/02/2017) for follow up with pre-visit labs, meter, and logs.  Marquis LunchGebre Bernhard Koskinen, MD Phone: [phone]  Fax: [phone]   11/07/2016, 10:41 AM

## 2016-11-22 ENCOUNTER — Other Ambulatory Visit: Payer: Self-pay | Admitting: "Endocrinology

## 2017-01-01 ENCOUNTER — Other Ambulatory Visit: Payer: Self-pay | Admitting: "Endocrinology

## 2017-01-01 LAB — COMPREHENSIVE METABOLIC PANEL
ALT: 22 U/L (ref 6–29)
AST: 27 U/L (ref 10–35)
Albumin: 3.2 g/dL — ABNORMAL LOW (ref 3.6–5.1)
Alkaline Phosphatase: 138 U/L — ABNORMAL HIGH (ref 33–130)
BUN: 12 mg/dL (ref 7–25)
CO2: 26 mmol/L (ref 20–31)
Calcium: 9.5 mg/dL (ref 8.6–10.4)
Chloride: 106 mmol/L (ref 98–110)
Creat: 1.12 mg/dL — ABNORMAL HIGH (ref 0.50–1.05)
Glucose, Bld: 75 mg/dL (ref 65–99)
Potassium: 4.6 mmol/L (ref 3.5–5.3)
Sodium: 142 mmol/L (ref 135–146)
Total Bilirubin: 0.2 mg/dL (ref 0.2–1.2)
Total Protein: 6.5 g/dL (ref 6.1–8.1)

## 2017-01-02 LAB — HEMOGLOBIN A1C
Hgb A1c MFr Bld: 7.3 % — ABNORMAL HIGH (ref <5.7)
Mean Plasma Glucose: 163 mg/dL

## 2017-01-07 ENCOUNTER — Ambulatory Visit (INDEPENDENT_AMBULATORY_CARE_PROVIDER_SITE_OTHER): Payer: Medicare Other | Admitting: "Endocrinology

## 2017-01-07 ENCOUNTER — Encounter: Payer: Self-pay | Admitting: "Endocrinology

## 2017-01-07 VITALS — BP 127/85 | HR 94 | Wt 147.0 lb

## 2017-01-07 DIAGNOSIS — I10 Essential (primary) hypertension: Secondary | ICD-10-CM | POA: Diagnosis not present

## 2017-01-07 DIAGNOSIS — E1165 Type 2 diabetes mellitus with hyperglycemia: Secondary | ICD-10-CM | POA: Diagnosis not present

## 2017-01-07 DIAGNOSIS — E782 Mixed hyperlipidemia: Secondary | ICD-10-CM

## 2017-01-07 DIAGNOSIS — E118 Type 2 diabetes mellitus with unspecified complications: Secondary | ICD-10-CM

## 2017-01-07 DIAGNOSIS — IMO0002 Reserved for concepts with insufficient information to code with codable children: Secondary | ICD-10-CM

## 2017-01-07 MED ORDER — INSULIN GLARGINE 100 UNIT/ML SOLOSTAR PEN: 30.0000 [IU] | PEN_INJECTOR | Freq: Every day | SUBCUTANEOUS | 2 refills | Status: DC

## 2017-01-07 NOTE — Progress Notes (Signed)
Subjective:    Patient ID: Ashley Blanchard, female    DOB: 03/23/1966. Patient is being seen in Follow-up for management of diabetes requested by  TAPPER,DAVID B, MD  Past Medical History:  Diagnosis Date  . Chronic anemia   . Diabetes mellitus, type II ([place])   . Hearing loss   . Hyperlipidemia   . Hypertension   . Mild mental retardation    Past Surgical History:  Procedure Laterality Date  . CATARACT EXTRACTION    . RETINAL DETACHMENT SURGERY     Social History   Social History  . Marital status: Single    Spouse name: N/A  . Number of children: N/A  . Years of education: N/A   Social History Main Topics  . Smoking status: Never Smoker  . Smokeless tobacco: Never Used  . Alcohol use No  . Drug use: No  . Sexual activity: Not Asked   Other Topics Concern  . None   Social History Narrative  . None   Outpatient Encounter Prescriptions as of 01/07/2017  Medication Sig  . aspirin EC 81 MG tablet Take 81 mg by mouth daily.  Marland Kitchen atorvastatin (LIPITOR) 20 MG tablet Take 20 mg by mouth daily.  Marland Kitchen diltiazem (DILACOR XR) 180 MG 24 hr capsule Take 180 mg by mouth daily.  . Insulin Glargine (LANTUS SOLOSTAR) 100 UNIT/ML Solostar Pen Inject 30 Units into the skin daily at 10 pm.  . latanoprost (XALATAN) 0.005 % ophthalmic solution 1 drop at bedtime.  . metFORMIN (GLUCOPHAGE) 500 MG tablet TAKE 1 TABLET BY MOUTH TWICE DAILY WITH MEALS.  . ramipril (ALTACE) 5 MG capsule Take 5 mg by mouth daily.  . traZODone (DESYREL) 50 MG tablet Take 50 mg by mouth at bedtime.  . [DISCONTINUED] Insulin Glargine (LANTUS SOLOSTAR) 100 UNIT/ML Solostar Pen Inject 20 Units into the skin daily with breakfast. (Patient taking differently: Inject 30 Units into the skin daily with breakfast. )  . ciprofloxacin (CIPRO) 500 MG tablet Take 1 tablet (500 mg total) by mouth 2 (two) times daily. For 5 more days   No facility-administered encounter medications on file as of 01/07/2017.    ALLERGIES: No  Known Allergies VACCINATION STATUS:  There is no immunization history on file for this patient.  Diabetes  She presents for her follow-up diabetic visit. She has type 2 diabetes mellitus. Onset time: She is not sure when she was diagnosed with diabetes. Her disease course has been improving (Her recent A1c is higher at 10.3% in May 2017.). There are no hypoglycemic associated symptoms. Pertinent negatives for hypoglycemia include no confusion, headaches, pallor or seizures. Associated symptoms include fatigue, polydipsia and polyuria. Pertinent negatives for diabetes include no chest pain and no polyphagia. There are no hypoglycemic complications. Symptoms are improving. There are no diabetic complications. Risk factors for coronary artery disease include diabetes mellitus, hypertension and dyslipidemia. Current diabetic treatment includes oral agent (monotherapy). Compliance with diabetes treatment: She is in a group home due to failure to care for self. Her weight is increasing steadily. She is following a generally unhealthy diet. When asked about meal planning, she reported none. She has not had a previous visit with a dietitian. She rarely participates in exercise. Her breakfast blood glucose range is generally 110-130 mg/dl. Her dinner blood glucose range is generally 140-180 mg/dl. Her overall blood glucose range is 140-180 mg/dl. An ACE inhibitor/angiotensin II receptor blocker is being taken.    Review of Systems  Constitutional: Positive for fatigue. Negative  for unexpected weight change.  HENT: Negative for trouble swallowing and voice change.   Eyes: Negative for visual disturbance.  Respiratory: Negative for cough, shortness of breath and wheezing.   Cardiovascular: Negative for chest pain, palpitations and leg swelling.  Gastrointestinal: Negative for diarrhea, nausea and vomiting.  Endocrine: Positive for polydipsia and polyuria. Negative for cold intolerance, heat intolerance and  polyphagia.  Genitourinary: Positive for frequency. Negative for dysuria and flank pain.  Musculoskeletal: Negative for arthralgias and myalgias.  Skin: Negative for color change, pallor, rash and wound.  Neurological: Negative for seizures and headaches.  Psychiatric/Behavioral: Negative for confusion and suicidal ideas.    Objective:    BP 127/85   Pulse 94   Wt 147 lb (66.7 kg)   BMI 27.78 kg/m   Wt Readings from Last 3 Encounters:  01/07/17 147 lb (66.7 kg)  11/07/16 141 lb (64 kg)  10/24/16 149 lb 11.1 oz (67.9 kg)    Physical Exam  Constitutional:  Light build.  HENT:  Head: Normocephalic and atraumatic.  Eyes: EOM are normal.  Neck: Normal range of motion. Neck supple. No tracheal deviation present. No thyromegaly present.  Cardiovascular: Normal rate and regular rhythm.   Pulmonary/Chest: Effort normal and breath sounds normal.  Abdominal: Soft. Bowel sounds are normal. There is no tenderness. There is no guarding.  Musculoskeletal: Normal range of motion. She exhibits no edema.  Neurological: She is alert. No cranial nerve deficit. Coordination normal.  She is hard of hearing. She has reported mental retardation accompanied by her care manager.  Skin: Skin is warm and dry. No rash noted. No erythema. No pallor.  Psychiatric: She has a normal mood and affect.    Recent Results (from the past 2160 hour(s))  Urinalysis, Routine w reflex microscopic     Status: Abnormal   Collection Time: 10/22/16  1:08 PM  Result Value Ref Range   Color, Urine YELLOW YELLOW   APPearance CLEAR CLEAR   Specific Gravity, Urine 1.013 1.005 - 1.030   pH 5.0 5.0 - 8.0   Glucose, UA NEGATIVE NEGATIVE mg/dL   Hgb urine dipstick MODERATE (A) NEGATIVE   Bilirubin Urine NEGATIVE NEGATIVE   Ketones, ur NEGATIVE NEGATIVE mg/dL   Protein, ur NEGATIVE NEGATIVE mg/dL   Nitrite POSITIVE (A) NEGATIVE   Leukocytes, UA NEGATIVE NEGATIVE   RBC / HPF 0-5 0 - 5 RBC/hpf   WBC, UA 0-5 0 - 5 WBC/hpf    Bacteria, UA FEW (A) NONE SEEN   Mucous PRESENT   POC CBG, ED     Status: Abnormal   Collection Time: 10/22/16  1:14 PM  Result Value Ref Range   Glucose-Capillary 102 (H) 65 - 99 mg/dL  CBC with Differential     Status: Abnormal   Collection Time: 10/22/16  1:45 PM  Result Value Ref Range   WBC 13.3 (H) 4.0 - 10.5 K/uL   RBC 4.62 3.87 - 5.11 MIL/uL   Hemoglobin 11.8 (L) 12.0 - 15.0 g/dL   HCT 38.0 36.0 - 46.0 %   MCV 82.3 78.0 - 100.0 fL   MCH 25.5 (L) 26.0 - 34.0 pg   MCHC 31.1 30.0 - 36.0 g/dL   RDW 15.6 (H) 11.5 - 15.5 %   Platelets 236 150 - 400 K/uL    Comment: PLATELET COUNT CONFIRMED BY SMEAR   Neutrophils Relative % 93 %   Lymphocytes Relative 3 %   Monocytes Relative 4 %   Eosinophils Relative 0 %   Basophils Relative  0 %   Neutro Abs 12.4 (H) 1.7 - 7.7 K/uL   Lymphs Abs 0.4 (L) 0.7 - 4.0 K/uL   Monocytes Absolute 0.5 0.1 - 1.0 K/uL   Eosinophils Absolute 0.0 0.0 - 0.7 K/uL   Basophils Absolute 0.0 0.0 - 0.1 K/uL   WBC Morphology TOXIC GRANULATION     Comment: SLIGHT INCREASED BANDS (>20% BANDS)   Comprehensive metabolic panel     Status: Abnormal   Collection Time: 10/22/16  1:45 PM  Result Value Ref Range   Sodium 136 135 - 145 mmol/L   Potassium 4.1 3.5 - 5.1 mmol/L   Chloride 106 101 - 111 mmol/L   CO2 21 (L) 22 - 32 mmol/L   Glucose, Bld 139 (H) 65 - 99 mg/dL   BUN 19 6 - 20 mg/dL   Creatinine, Ser 0.89 0.44 - 1.00 mg/dL   Calcium 8.4 (L) 8.9 - 10.3 mg/dL   Total Protein 5.5 (L) 6.5 - 8.1 g/dL   Albumin 2.6 (L) 3.5 - 5.0 g/dL   AST 38 15 - 41 U/L   ALT 26 14 - 54 U/L   Alkaline Phosphatase 93 38 - 126 U/L   Total Bilirubin 0.3 0.3 - 1.2 mg/dL   GFR calc non Af Amer >60 >60 mL/min   GFR calc Af Amer >60 >60 mL/min    Comment: (NOTE) The eGFR has been calculated using the CKD EPI equation. This calculation has not been validated in all clinical situations. eGFR's persistently <60 mL/min signify possible Chronic Kidney Disease.    Anion gap 9 5  - 15  Lipase, blood     Status: None   Collection Time: 10/22/16  1:45 PM  Result Value Ref Range   Lipase 34 11 - 51 U/L  I-Stat Troponin, ED (not at [facility])     Status: None   Collection Time: 10/22/16  1:51 PM  Result Value Ref Range   Troponin i, poc 0.00 0.00 - 0.08 ng/mL   Comment 3            Comment: Due to the release kinetics of cTnI, a negative result within the first hours of the onset of symptoms does not rule out myocardial infarction with certainty. If myocardial infarction is still suspected, repeat the test at appropriate intervals.   I-Stat CG4 Lactic Acid, ED     Status: Abnormal   Collection Time: 10/22/16  1:54 PM  Result Value Ref Range   Lactic Acid, Venous 3.54 (HH) 0.5 - 1.9 mmol/L   Comment MD NOTIFIED, REPEAT TEST   Magnesium     Status: Abnormal   Collection Time: 10/22/16  1:54 PM  Result Value Ref Range   Magnesium 1.1 (L) 1.7 - 2.4 mg/dL  Phosphorus     Status: None   Collection Time: 10/22/16  1:54 PM  Result Value Ref Range   Phosphorus 2.9 2.5 - 4.6 mg/dL  Urine culture     Status: Abnormal   Collection Time: 10/22/16  2:59 PM  Result Value Ref Range   Specimen Description URINE, RANDOM    Special Requests NONE    Culture >=100,000 COLONIES/mL ESCHERICHIA COLI (A)    Report Status 10/25/2016 FINAL    Organism ID, Bacteria ESCHERICHIA COLI (A)       Susceptibility   Escherichia coli - MIC*    AMPICILLIN >=32 RESISTANT Resistant     CEFAZOLIN <=4 SENSITIVE Sensitive     CEFTRIAXONE <=1 SENSITIVE Sensitive     CIPROFLOXACIN <=0.25  SENSITIVE Sensitive     GENTAMICIN <=1 SENSITIVE Sensitive     IMIPENEM <=0.25 SENSITIVE Sensitive     NITROFURANTOIN <=16 SENSITIVE Sensitive     TRIMETH/SULFA <=20 SENSITIVE Sensitive     AMPICILLIN/SULBACTAM >=32 RESISTANT Resistant     PIP/TAZO <=4 SENSITIVE Sensitive     Extended ESBL NEGATIVE Sensitive     * >=100,000 COLONIES/mL ESCHERICHIA COLI  C difficile quick scan w PCR reflex     Status: None    Collection Time: 10/22/16  3:29 PM  Result Value Ref Range   C Diff antigen NEGATIVE NEGATIVE   C Diff toxin NEGATIVE NEGATIVE   C Diff interpretation No C. difficile detected.   Blood culture (routine x 2)     Status: None   Collection Time: 10/22/16  4:30 PM  Result Value Ref Range   Specimen Description BLOOD LEFT HAND    Special Requests BOTTLES DRAWN AEROBIC AND ANAEROBIC 6CC EACH    Culture NO GROWTH 5 DAYS    Report Status 10/27/2016 FINAL   I-Stat CG4 Lactic Acid, ED     Status: Abnormal   Collection Time: 10/22/16  4:36 PM  Result Value Ref Range   Lactic Acid, Venous 2.75 (HH) 0.5 - 1.9 mmol/L   Comment NOTIFIED PHYSICIAN   MRSA PCR Screening     Status: None   Collection Time: 10/22/16  5:48 PM  Result Value Ref Range   MRSA by PCR NEGATIVE NEGATIVE    Comment:        The GeneXpert MRSA Assay (FDA approved for NASAL specimens only), is one component of a comprehensive MRSA colonization surveillance program. It is not intended to diagnose MRSA infection nor to guide or monitor treatment for MRSA infections.   Lactic acid, plasma     Status: Abnormal   Collection Time: 10/22/16  7:57 PM  Result Value Ref Range   Lactic Acid, Venous 2.6 (HH) 0.5 - 1.9 mmol/L    Comment: CRITICAL RESULT CALLED TO, READ BACK BY AND VERIFIED WITH: DANIELS,J AT 2120 ON 10/22/2016 BY ISLEY,B   Blood culture (routine x 2)     Status: None   Collection Time: 10/22/16  8:08 PM  Result Value Ref Range   Specimen Description BLOOD LEFT WRIST    Special Requests BOTTLES DRAWN AEROBIC AND ANAEROBIC 6CC EACH    Culture NO GROWTH 9 DAYS    Report Status 10/31/2016 FINAL   Glucose, capillary     Status: None   Collection Time: 10/22/16  9:47 PM  Result Value Ref Range   Glucose-Capillary 88 65 - 99 mg/dL   Comment 1 Notify RN    Comment 2 Document in Chart   CBC WITH DIFFERENTIAL     Status: Abnormal   Collection Time: 10/23/16  4:06 AM  Result Value Ref Range   WBC 7.9 4.0 - 10.5 K/uL    RBC 4.08 3.87 - 5.11 MIL/uL   Hemoglobin 10.4 (L) 12.0 - 15.0 g/dL   HCT 33.4 (L) 36.0 - 46.0 %   MCV 81.9 78.0 - 100.0 fL   MCH 25.5 (L) 26.0 - 34.0 pg   MCHC 31.1 30.0 - 36.0 g/dL   RDW 15.9 (H) 11.5 - 15.5 %   Platelets 223 150 - 400 K/uL   Neutrophils Relative % 91 %   Neutro Abs 7.3 1.7 - 7.7 K/uL   Lymphocytes Relative 4 %   Lymphs Abs 0.3 (L) 0.7 - 4.0 K/uL   Monocytes Relative 5 %  Monocytes Absolute 0.4 0.1 - 1.0 K/uL   Eosinophils Relative 0 %   Eosinophils Absolute 0.0 0.0 - 0.7 K/uL   Basophils Relative 0 %   Basophils Absolute 0.0 0.0 - 0.1 K/uL  Basic metabolic panel     Status: Abnormal   Collection Time: 10/23/16  4:06 AM  Result Value Ref Range   Sodium 141 135 - 145 mmol/L   Potassium 3.8 3.5 - 5.1 mmol/L   Chloride 112 (H) 101 - 111 mmol/L   CO2 21 (L) 22 - 32 mmol/L   Glucose, Bld 52 (L) 65 - 99 mg/dL   BUN 22 (H) 6 - 20 mg/dL   Creatinine, Ser 0.98 0.44 - 1.00 mg/dL   Calcium 7.6 (L) 8.9 - 10.3 mg/dL   GFR calc non Af Amer >60 >60 mL/min   GFR calc Af Amer >60 >60 mL/min    Comment: (NOTE) The eGFR has been calculated using the CKD EPI equation. This calculation has not been validated in all clinical situations. eGFR's persistently <60 mL/min signify possible Chronic Kidney Disease.    Anion gap 8 5 - 15  Lactic acid, plasma     Status: None   Collection Time: 10/23/16  4:06 AM  Result Value Ref Range   Lactic Acid, Venous 0.9 0.5 - 1.9 mmol/L  Glucose, capillary     Status: Abnormal   Collection Time: 10/23/16  8:08 AM  Result Value Ref Range   Glucose-Capillary 41 (LL) 65 - 99 mg/dL  Glucose, capillary     Status: None   Collection Time: 10/23/16  8:57 AM  Result Value Ref Range   Glucose-Capillary 67 65 - 99 mg/dL   Comment 1 Notify RN    Comment 2 Document in Chart   Glucose, capillary     Status: None   Collection Time: 10/23/16  9:59 AM  Result Value Ref Range   Glucose-Capillary 88 65 - 99 mg/dL   Comment 1 Notify RN    Comment  2 Document in Chart   Glucose, capillary     Status: Abnormal   Collection Time: 10/23/16 12:34 PM  Result Value Ref Range   Glucose-Capillary 138 (H) 65 - 99 mg/dL  Glucose, capillary     Status: None   Collection Time: 10/23/16  4:39 PM  Result Value Ref Range   Glucose-Capillary 84 65 - 99 mg/dL  Glucose, capillary     Status: Abnormal   Collection Time: 10/23/16 10:30 PM  Result Value Ref Range   Glucose-Capillary 182 (H) 65 - 99 mg/dL  CBC WITH DIFFERENTIAL     Status: Abnormal   Collection Time: 10/24/16  4:52 AM  Result Value Ref Range   WBC 3.8 (L) 4.0 - 10.5 K/uL   RBC 4.65 3.87 - 5.11 MIL/uL   Hemoglobin 11.6 (L) 12.0 - 15.0 g/dL   HCT 38.4 36.0 - 46.0 %   MCV 82.6 78.0 - 100.0 fL   MCH 24.9 (L) 26.0 - 34.0 pg   MCHC 30.2 30.0 - 36.0 g/dL   RDW 16.4 (H) 11.5 - 15.5 %   Platelets 176 150 - 400 K/uL   Neutrophils Relative % 64 %   Neutro Abs 2.5 1.7 - 7.7 K/uL   Lymphocytes Relative 18 %   Lymphs Abs 0.7 0.7 - 4.0 K/uL   Monocytes Relative 18 %   Monocytes Absolute 0.7 0.1 - 1.0 K/uL   Eosinophils Relative 0 %   Eosinophils Absolute 0.0 0.0 - 0.7 K/uL  Basophils Relative 0 %   Basophils Absolute 0.0 0.0 - 0.1 K/uL  Basic metabolic panel     Status: Abnormal   Collection Time: 10/24/16  4:52 AM  Result Value Ref Range   Sodium 139 135 - 145 mmol/L   Potassium 4.2 3.5 - 5.1 mmol/L   Chloride 112 (H) 101 - 111 mmol/L   CO2 21 (L) 22 - 32 mmol/L   Glucose, Bld 176 (H) 65 - 99 mg/dL   BUN 13 6 - 20 mg/dL   Creatinine, Ser 0.90 0.44 - 1.00 mg/dL   Calcium 7.9 (L) 8.9 - 10.3 mg/dL   GFR calc non Af Amer >60 >60 mL/min   GFR calc Af Amer >60 >60 mL/min    Comment: (NOTE) The eGFR has been calculated using the CKD EPI equation. This calculation has not been validated in all clinical situations. eGFR's persistently <60 mL/min signify possible Chronic Kidney Disease.    Anion gap 6 5 - 15  Magnesium     Status: None   Collection Time: 10/24/16  4:52 AM  Result  Value Ref Range   Magnesium 2.2 1.7 - 2.4 mg/dL  Glucose, capillary     Status: Abnormal   Collection Time: 10/24/16  8:01 AM  Result Value Ref Range   Glucose-Capillary 115 (H) 65 - 99 mg/dL  Glucose, capillary     Status: Abnormal   Collection Time: 10/24/16 11:33 AM  Result Value Ref Range   Glucose-Capillary 164 (H) 65 - 99 mg/dL   Comment 1 Notify RN   Glucose, capillary     Status: Abnormal   Collection Time: 10/24/16  4:23 PM  Result Value Ref Range   Glucose-Capillary 169 (H) 65 - 99 mg/dL  Glucose, capillary     Status: Abnormal   Collection Time: 10/24/16  9:39 PM  Result Value Ref Range   Glucose-Capillary 251 (H) 65 - 99 mg/dL   Comment 1 Notify RN    Comment 2 Document in Chart   CBC WITH DIFFERENTIAL     Status: Abnormal   Collection Time: 10/25/16  6:46 AM  Result Value Ref Range   WBC 2.6 (L) 4.0 - 10.5 K/uL   RBC 4.58 3.87 - 5.11 MIL/uL   Hemoglobin 11.8 (L) 12.0 - 15.0 g/dL   HCT 37.6 36.0 - 46.0 %   MCV 82.1 78.0 - 100.0 fL   MCH 25.8 (L) 26.0 - 34.0 pg   MCHC 31.4 30.0 - 36.0 g/dL   RDW 16.1 (H) 11.5 - 15.5 %   Platelets 152 150 - 400 K/uL   Neutrophils Relative % 49 %   Neutro Abs 1.3 (L) 1.7 - 7.7 K/uL   Lymphocytes Relative 35 %   Lymphs Abs 0.9 0.7 - 4.0 K/uL   Monocytes Relative 16 %   Monocytes Absolute 0.4 0.1 - 1.0 K/uL   Eosinophils Relative 0 %   Eosinophils Absolute 0.0 0.0 - 0.7 K/uL   Basophils Relative 0 %   Basophils Absolute 0.0 0.0 - 0.1 K/uL  Basic metabolic panel     Status: Abnormal   Collection Time: 10/25/16  6:46 AM  Result Value Ref Range   Sodium 137 135 - 145 mmol/L   Potassium 4.2 3.5 - 5.1 mmol/L   Chloride 109 101 - 111 mmol/L   CO2 24 22 - 32 mmol/L   Glucose, Bld 216 (H) 65 - 99 mg/dL   BUN 7 6 - 20 mg/dL   Creatinine, Ser 0.87 0.44 - 1.00  mg/dL   Calcium 8.7 (L) 8.9 - 10.3 mg/dL   GFR calc non Af Amer >60 >60 mL/min   GFR calc Af Amer >60 >60 mL/min    Comment: (NOTE) The eGFR has been calculated using the  CKD EPI equation. This calculation has not been validated in all clinical situations. eGFR's persistently <60 mL/min signify possible Chronic Kidney Disease.    Anion gap 4 (L) 5 - 15  Glucose, capillary     Status: Abnormal   Collection Time: 10/25/16  7:50 AM  Result Value Ref Range   Glucose-Capillary 210 (H) 65 - 99 mg/dL  Glucose, capillary     Status: Abnormal   Collection Time: 10/25/16 11:18 AM  Result Value Ref Range   Glucose-Capillary 268 (H) 65 - 99 mg/dL  Comprehensive metabolic panel     Status: Abnormal   Collection Time: 01/01/17 10:05 AM  Result Value Ref Range   Sodium 142 135 - 146 mmol/L   Potassium 4.6 3.5 - 5.3 mmol/L   Chloride 106 98 - 110 mmol/L   CO2 26 20 - 31 mmol/L   Glucose, Bld 75 65 - 99 mg/dL   BUN 12 7 - 25 mg/dL   Creat 1.12 (H) 0.50 - 1.05 mg/dL    Comment:   For patients > or = 53 years of age: The upper reference limit for Creatinine is approximately 13% higher for people identified as African-American.      Total Bilirubin 0.2 0.2 - 1.2 mg/dL   Alkaline Phosphatase 138 (H) 33 - 130 U/L   AST 27 10 - 35 U/L   ALT 22 6 - 29 U/L   Total Protein 6.5 6.1 - 8.1 g/dL   Albumin 3.2 (L) 3.6 - 5.1 g/dL   Calcium 9.5 8.6 - 10.4 mg/dL  Hemoglobin A1c     Status: Abnormal   Collection Time: 01/01/17 10:05 AM  Result Value Ref Range   Hgb A1c MFr Bld 7.3 (H) <5.7 %    Comment:   For someone without known diabetes, a hemoglobin A1c value of 6.5% or greater indicates that they may have diabetes and this should be confirmed with a follow-up test.   For someone with known diabetes, a value <7% indicates that their diabetes is well controlled and a value greater than or equal to 7% indicates suboptimal control. A1c targets should be individualized based on duration of diabetes, age, comorbid conditions, and other considerations.   Currently, no consensus exists for use of hemoglobin A1c for diagnosis of diabetes for children.      Mean  Plasma Glucose 163 mg/dL    03/29/2016 A1c was 10.3%.  Assessment & Plan:   1. Uncontrolled type 2 diabetes mellitus with complication, without long-term current use of insulin ([place])  - Patient has controlled symptomatic type 2 DM Duration unknown,  with most recent A1c of  7.3% improving from 10.3 %.  - Labs show normal renal function.  - She has no reported gross complications from diabetes however patient remains at a high risk for more acute and chronic complications of diabetes which include CAD, CVA, CKD, retinopathy, and neuropathy.  -  She does not understand the high risk she has, these are all discussed with her care manager from the group home.  - I have counseled the patient on diet management by adopting a carbohydrate restricted/protein rich diet.  - Suggestion is made for patient to avoid simple carbohydrates   from their diet including Cakes , Desserts, Ice  Cream,  Soda (  diet and regular) , Sweet Tea , Candies,  Chips, Cookies, Artificial Sweeteners,   and "Sugar-free" Products . This will help patient to have stable blood glucose profile and potentially avoid unintended weight gain.  - I encouraged the patient to switch to  unprocessed or minimally processed complex starch and increased protein intake (animal or plant source), fruits, and vegetables.  - Patient is advised to stick to a routine mealtimes to eat 3 meals  a day and avoid unnecessary snacks ( to snack only to correct hypoglycemia).  - The patient will be scheduled with Jearld Fenton, RDN, CDE for individualized DM education.  - I have approached patient with the following individualized plan to manage diabetes and patient agrees:   -She will  still require insulin treatment for diabetes , however she does not need prandial insulin. - I will proceed to lower it to 20 units with breakfast associated with monitoring of blood glucose daily before breakfast and at bedtime.  -Patient's care taker is encouraged  to call clinic for blood glucose levels less than 70 or above 300 mg /dl.  - I will continue low  -dose metformin 500 mg by mouth twice a day.   - She is not suitable candidate for SGLt2 inhibitors nor incretin therapy. - Patient specific target  A1c;  LDL, HDL, Triglycerides, and  Waist Circumference were discussed in detail.  2) BP/HT : controlled, continue current medications including ACEI/ARB. 3) Lipids/HPL:   control unknown, continue statins. 4)  Weight/Diet: she is light build,  , exercise, and detailed carbohydrates information provided.  5) Chronic Care/Health Maintenance:  -Patient is on ACEI/ARB and Statin medications and encouraged to continue to follow up with Ophthalmology, Podiatrist at least yearly or according to recommendations, and advised to   stay away from smoking. I have recommended yearly flu vaccine and pneumonia vaccination at least every 5 years; moderate intensity exercise for up to 150 minutes weekly; and  sleep for at least 7 hours a day.  - 30 minutes of time was spent on the care of this patient , 50% of which was applied for counseling on diabetes complications and their preventions.  - Patient to bring meter and  blood glucose logs during her next visit.   - I advised patient to maintain close follow up with TAPPER,DAVID B, MD for primary care needs.  Follow up plan: - Return in about 3 months (around 04/06/2017) for follow up with pre-visit labs, meter, and logs.  Glade Lloyd, MD Phone: [phone]  Fax: [phone]   01/07/2017, 3:29 PM

## 2017-04-05 LAB — HEMOGLOBIN A1C: Hemoglobin A1C: 10.6

## 2017-04-05 LAB — LIPID PANEL
Cholesterol: 166 mg/dL (ref 0–200)
HDL: 64 mg/dL (ref 35–70)
LDL Cholesterol: 91 mg/dL
Triglycerides: 53 mg/dL (ref 40–160)

## 2017-04-11 ENCOUNTER — Ambulatory Visit: Payer: Medicare Other | Admitting: "Endocrinology

## 2017-04-15 ENCOUNTER — Encounter: Payer: Self-pay | Admitting: "Endocrinology

## 2017-04-15 ENCOUNTER — Ambulatory Visit (INDEPENDENT_AMBULATORY_CARE_PROVIDER_SITE_OTHER): Payer: Medicare Other | Admitting: "Endocrinology

## 2017-04-15 VITALS — BP 146/82 | HR 90 | Wt 152.0 lb

## 2017-04-15 DIAGNOSIS — E1165 Type 2 diabetes mellitus with hyperglycemia: Secondary | ICD-10-CM | POA: Diagnosis not present

## 2017-04-15 DIAGNOSIS — E782 Mixed hyperlipidemia: Secondary | ICD-10-CM

## 2017-04-15 DIAGNOSIS — E118 Type 2 diabetes mellitus with unspecified complications: Secondary | ICD-10-CM

## 2017-04-15 DIAGNOSIS — I10 Essential (primary) hypertension: Secondary | ICD-10-CM | POA: Diagnosis not present

## 2017-04-15 DIAGNOSIS — IMO0002 Reserved for concepts with insufficient information to code with codable children: Secondary | ICD-10-CM

## 2017-04-15 NOTE — Progress Notes (Signed)
Subjective:    Patient ID: Ashley Blanchard, female    DOB: 02/01/1966. Patient is being seen in Follow-up for management of diabetes requested by  Louie Bostonapper, David B., MD  Past Medical History:  Diagnosis Date  . Chronic anemia   . Diabetes mellitus, type II (HCC)   . Hearing loss   . Hyperlipidemia   . Hypertension   . Mild mental retardation    Past Surgical History:  Procedure Laterality Date  . CATARACT EXTRACTION    . RETINAL DETACHMENT SURGERY     Social History   Social History  . Marital status: Single    Spouse name: N/A  . Number of children: N/A  . Years of education: N/A   Social History Main Topics  . Smoking status: Never Smoker  . Smokeless tobacco: Never Used  . Alcohol use No  . Drug use: No  . Sexual activity: Not Asked   Other Topics Concern  . None   Social History Narrative  . None   Outpatient Encounter Prescriptions as of 04/15/2017  Medication Sig  . aspirin EC 81 MG tablet Take 81 mg by mouth daily.  Marland Kitchen. atorvastatin (LIPITOR) 20 MG tablet Take 20 mg by mouth daily.  . ciprofloxacin (CIPRO) 500 MG tablet Take 1 tablet (500 mg total) by mouth 2 (two) times daily. For 5 more days  . diltiazem (DILACOR XR) 180 MG 24 hr capsule Take 180 mg by mouth daily.  . Insulin Glargine (LANTUS SOLOSTAR) 100 UNIT/ML Solostar Pen Inject 30 Units into the skin daily at 10 pm.  . latanoprost (XALATAN) 0.005 % ophthalmic solution 1 drop at bedtime.  . metFORMIN (GLUCOPHAGE) 500 MG tablet TAKE 1 TABLET BY MOUTH TWICE DAILY WITH MEALS.  . ramipril (ALTACE) 5 MG capsule Take 5 mg by mouth daily.  . traZODone (DESYREL) 50 MG tablet Take 50 mg by mouth at bedtime.   No facility-administered encounter medications on file as of 04/15/2017.    ALLERGIES: No Known Allergies VACCINATION STATUS:  There is no immunization history on file for this patient.  Diabetes  She presents for her follow-up diabetic visit. She has type 2 diabetes mellitus. Onset time: She is not  sure when she was diagnosed with diabetes. Her disease course has been worsening (Her recent A1c is higher at 10.3% in May 2017.). There are no hypoglycemic associated symptoms. Pertinent negatives for hypoglycemia include no confusion, headaches, pallor or seizures. Associated symptoms include fatigue, polydipsia and polyuria. Pertinent negatives for diabetes include no chest pain and no polyphagia. There are no hypoglycemic complications. Symptoms are worsening. There are no diabetic complications. Risk factors for coronary artery disease include diabetes mellitus, hypertension and dyslipidemia. Current diabetic treatment includes oral agent (monotherapy). Compliance with diabetes treatment: She is in a group home due to failure to care for self. Her weight is increasing steadily. She is following a generally unhealthy diet. When asked about meal planning, she reported none. She has not had a previous visit with a dietitian. She rarely participates in exercise. Her home blood glucose trend is increasing steadily. Her breakfast blood glucose range is generally 180-200 mg/dl. Her dinner blood glucose range is generally >200 mg/dl. Her overall blood glucose range is >200 mg/dl. An ACE inhibitor/angiotensin II receptor blocker is being taken.    Review of Systems  Constitutional: Positive for fatigue. Negative for unexpected weight change.  HENT: Negative for trouble swallowing and voice change.   Eyes: Negative for visual disturbance.  Respiratory: Negative for  cough, shortness of breath and wheezing.   Cardiovascular: Negative for chest pain, palpitations and leg swelling.  Gastrointestinal: Negative for diarrhea, nausea and vomiting.  Endocrine: Positive for polydipsia and polyuria. Negative for cold intolerance, heat intolerance and polyphagia.  Genitourinary: Positive for frequency. Negative for dysuria and flank pain.  Musculoskeletal: Negative for arthralgias and myalgias.  Skin: Negative for color  change, pallor, rash and wound.  Neurological: Negative for seizures and headaches.  Psychiatric/Behavioral: Negative for confusion and suicidal ideas.    Objective:    BP (!) 146/82   Pulse 90   Wt 152 lb (68.9 kg)   BMI 28.72 kg/m   Wt Readings from Last 3 Encounters:  04/15/17 152 lb (68.9 kg)  01/07/17 147 lb (66.7 kg)  11/07/16 141 lb (64 kg)    Physical Exam  HENT:  Head: Normocephalic and atraumatic.  Eyes: EOM are normal.  Neck: Normal range of motion. Neck supple. No tracheal deviation present. No thyromegaly present.  Cardiovascular: Normal rate and regular rhythm.   Pulmonary/Chest: Effort normal and breath sounds normal.  Abdominal: Soft. Bowel sounds are normal. There is no tenderness. There is no guarding.  Musculoskeletal: Normal range of motion. She exhibits no edema.  Neurological: She is alert. No cranial nerve deficit. Coordination normal.  She is hard of hearing. She has reported mental retardation accompanied by her care manager.  Skin: Skin is warm and dry. No rash noted. No erythema. No pallor.  Psychiatric: She has a normal mood and affect.   Recent Results (from the past 2160 hour(s))  Lipid panel     Status: None   Collection Time: 04/05/17 12:00 AM  Result Value Ref Range   Triglycerides 53 40 - 160 mg/dL   Cholesterol 161 0 - 096 mg/dL   HDL 64 35 - 70 mg/dL   LDL Cholesterol 91 mg/dL  Hemoglobin E4V     Status: None   Collection Time: 04/05/17 12:00 AM  Result Value Ref Range   Hemoglobin A1C 10.6    Her A1c was 7.3% on 01/01/2017.  03/29/2016 A1c was 10.3%.  Assessment & Plan:   1. Uncontrolled type 2 diabetes mellitus with complication, without long-term current use of insulin (HCC)  - Patient has controlled symptomatic type 2 DM Duration unknown. - She came with loss of control of her diabetes. Her A1c has increased to 10.6% from 7.3% and she has gained more than 10 pounds since last visit.  - Labs show normal renal function.  -  She has no reported gross complications from diabetes however patient remains at a high risk for more acute and chronic complications of diabetes which include CAD, CVA, CKD, retinopathy, and neuropathy.  -  She does not understand the high risk she has, these are all discussed with her care manager from the group home.  - I have counseled the patient on diet management by adopting a carbohydrate restricted/protein rich diet.  - Suggestion is made for patient to avoid simple carbohydrates   from their diet including Cakes , Desserts, Ice Cream,  Soda (  diet and regular) , Sweet Tea , Candies,  Chips, Cookies, Artificial Sweeteners,   and "Sugar-free" Products . This will help patient to have stable blood glucose profile and potentially avoid unintended weight gain.  - I encouraged the patient to switch to  unprocessed or minimally processed complex starch and increased protein intake (animal or plant source), fruits, and vegetables.  - Patient is advised to stick to a routine  mealtimes to eat 3 meals  a day and avoid unnecessary snacks ( to snack only to correct hypoglycemia).  - The patient will be scheduled with Norm Salt, RDN, CDE for individualized DM education.  - I have approached patient with the following individualized plan to manage diabetes and patient agrees:   -She is losing control of her diabetes, mainly due to dietary indiscretion and recent weight gain. - It is essential to keep treatment regimen simple for this patient with significant MR, the best ways to avoid processed carbohydrates from her diet. - I will proceed to increase her basal insulin Lantus to 30 units daily with breakfast associated with monitoring of blood glucose 4 times a day-before meals and at bedtime and return in 10 days for reevaluation. -  She will be assessed for the need of bolus insulin. -Patient's care taker is encouraged to call clinic for blood glucose levels less than 70 or above 300 mg  /dl.  - I will continue low  -dose metformin 500 mg by mouth twice a day.   - She is not suitable candidate for SGLt2 inhibitors nor incretin therapy. - Patient specific target  A1c;  LDL, HDL, Triglycerides, and  Waist Circumference were discussed in detail.  2) BP/HT : controlled, continue current medications including ACEI/ARB. 3) Lipids/HPL:   control unknown, continue statins. 4)  Weight/Diet: she is light build,  , exercise, and detailed carbohydrates information provided.  5) Chronic Care/Health Maintenance:  -Patient is on ACEI/ARB and Statin medications and encouraged to continue to follow up with Ophthalmology, Podiatrist at least yearly or according to recommendations, and advised to   stay away from smoking. I have recommended yearly flu vaccine and pneumonia vaccination at least every 5 years;  60 minutes of walking 4 days a week is recommended for her , and  sleep for at least 7 hours a day.  - 30 minutes of time was spent on the care of this patient , 50% of which was applied for counseling on diabetes complications and their preventions.  - Patient to bring meter and  blood glucose logs during her next visit.   - I advised patient to maintain close follow up with Louie Boston., MD for primary care needs.  Follow up plan: - Return in about 10 days (around 04/25/2017) for follow up with meter and logs- no labs.  Marquis Lunch, MD Phone: [phone]  Fax: [phone]   04/15/2017, 4:30 PM

## 2017-04-26 ENCOUNTER — Ambulatory Visit (INDEPENDENT_AMBULATORY_CARE_PROVIDER_SITE_OTHER): Payer: Medicare Other | Admitting: "Endocrinology

## 2017-04-26 ENCOUNTER — Encounter: Payer: Self-pay | Admitting: "Endocrinology

## 2017-04-26 VITALS — BP 131/83 | HR 101 | Wt 150.0 lb

## 2017-04-26 DIAGNOSIS — E1165 Type 2 diabetes mellitus with hyperglycemia: Secondary | ICD-10-CM | POA: Diagnosis not present

## 2017-04-26 DIAGNOSIS — IMO0002 Reserved for concepts with insufficient information to code with codable children: Secondary | ICD-10-CM

## 2017-04-26 DIAGNOSIS — E782 Mixed hyperlipidemia: Secondary | ICD-10-CM

## 2017-04-26 DIAGNOSIS — I10 Essential (primary) hypertension: Secondary | ICD-10-CM

## 2017-04-26 DIAGNOSIS — E118 Type 2 diabetes mellitus with unspecified complications: Secondary | ICD-10-CM

## 2017-04-26 MED ORDER — INSULIN GLARGINE 100 UNIT/ML SOLOSTAR PEN: 40.0000 [IU] | PEN_INJECTOR | Freq: Every day | SUBCUTANEOUS | 2 refills | Status: DC

## 2017-04-26 NOTE — Progress Notes (Signed)
Subjective:    Patient ID: Ashley Blanchard, female    DOB: May 19, 1966. Patient is being seen in Follow-up for management of diabetes requested by  Louie Boston., MD  Past Medical History:  Diagnosis Date  . Chronic anemia   . Diabetes mellitus, type II (HCC)   . Hearing loss   . Hyperlipidemia   . Hypertension   . Mild mental retardation    Past Surgical History:  Procedure Laterality Date  . CATARACT EXTRACTION    . RETINAL DETACHMENT SURGERY     Social History   Social History  . Marital status: Single    Spouse name: N/A  . Number of children: N/A  . Years of education: N/A   Social History Main Topics  . Smoking status: Never Smoker  . Smokeless tobacco: Never Used  . Alcohol use No  . Drug use: No  . Sexual activity: Not Asked   Other Topics Concern  . None   Social History Narrative  . None   Outpatient Encounter Prescriptions as of 04/26/2017  Medication Sig  . aspirin EC 81 MG tablet Take 81 mg by mouth daily.  Marland Kitchen atorvastatin (LIPITOR) 20 MG tablet Take 20 mg by mouth daily.  . ciprofloxacin (CIPRO) 500 MG tablet Take 1 tablet (500 mg total) by mouth 2 (two) times daily. For 5 more days  . diltiazem (DILACOR XR) 180 MG 24 hr capsule Take 180 mg by mouth daily.  . Insulin Glargine (LANTUS SOLOSTAR) 100 UNIT/ML Solostar Pen Inject 40 Units into the skin daily at 10 pm.  . latanoprost (XALATAN) 0.005 % ophthalmic solution 1 drop at bedtime.  . metFORMIN (GLUCOPHAGE) 500 MG tablet TAKE 1 TABLET BY MOUTH TWICE DAILY WITH MEALS.  . ramipril (ALTACE) 5 MG capsule Take 5 mg by mouth daily.  . traZODone (DESYREL) 50 MG tablet Take 50 mg by mouth at bedtime.  . [DISCONTINUED] Insulin Glargine (LANTUS SOLOSTAR) 100 UNIT/ML Solostar Pen Inject 30 Units into the skin daily at 10 pm.   No facility-administered encounter medications on file as of 04/26/2017.    ALLERGIES: No Known Allergies VACCINATION STATUS:  There is no immunization history on file for this  patient.  Diabetes  She presents for her follow-up diabetic visit. She has type 2 diabetes mellitus. Onset time: She is not sure when she was diagnosed with diabetes. Her disease course has been worsening (Her recent A1c is higher at 10.3% in May 2017.). There are no hypoglycemic associated symptoms. Pertinent negatives for hypoglycemia include no confusion, headaches, pallor or seizures. Associated symptoms include fatigue, polydipsia and polyuria. Pertinent negatives for diabetes include no chest pain and no polyphagia. There are no hypoglycemic complications. Symptoms are worsening. There are no diabetic complications. Risk factors for coronary artery disease include diabetes mellitus, hypertension and dyslipidemia. Current diabetic treatment includes oral agent (monotherapy). Compliance with diabetes treatment: She is in a group home due to failure to care for self. Her weight is increasing steadily. She is following a generally unhealthy diet. When asked about meal planning, she reported none. She has not had a previous visit with a dietitian. She rarely participates in exercise. Her home blood glucose trend is increasing steadily. Her breakfast blood glucose range is generally 180-200 mg/dl. Her dinner blood glucose range is generally >200 mg/dl. Her overall blood glucose range is >200 mg/dl. An ACE inhibitor/angiotensin II receptor blocker is being taken.    Review of Systems  Constitutional: Positive for fatigue. Negative for unexpected weight  change.  HENT: Negative for trouble swallowing and voice change.   Eyes: Negative for visual disturbance.  Respiratory: Negative for cough, shortness of breath and wheezing.   Cardiovascular: Negative for chest pain, palpitations and leg swelling.  Gastrointestinal: Negative for diarrhea, nausea and vomiting.  Endocrine: Positive for polydipsia and polyuria. Negative for cold intolerance, heat intolerance and polyphagia.  Genitourinary: Positive for  frequency. Negative for dysuria and flank pain.  Musculoskeletal: Negative for arthralgias and myalgias.  Skin: Negative for color change, pallor, rash and wound.  Neurological: Negative for seizures and headaches.  Psychiatric/Behavioral: Negative for confusion and suicidal ideas.    Objective:    BP 131/83   Pulse (!) 101   Wt 150 lb (68 kg)   BMI 28.34 kg/m   Wt Readings from Last 3 Encounters:  04/26/17 150 lb (68 kg)  04/15/17 152 lb (68.9 kg)  01/07/17 147 lb (66.7 kg)    Physical Exam  HENT:  Head: Normocephalic and atraumatic.  Eyes: EOM are normal.  Neck: Normal range of motion. Neck supple. No tracheal deviation present. No thyromegaly present.  Cardiovascular: Normal rate and regular rhythm.   Pulmonary/Chest: Effort normal and breath sounds normal.  Abdominal: Soft. Bowel sounds are normal. There is no tenderness. There is no guarding.  Musculoskeletal: Normal range of motion. She exhibits no edema.  Neurological: She is alert. No cranial nerve deficit. Coordination normal.  She is hard of hearing. She has reported mental retardation accompanied by her care manager.  Skin: Skin is warm and dry. No rash noted. No erythema. No pallor.  Psychiatric: She has a normal mood and affect.   Recent Results (from the past 2160 hour(s))  Lipid panel     Status: None   Collection Time: 04/05/17 12:00 AM  Result Value Ref Range   Triglycerides 53 40 - 160 mg/dL   Cholesterol 161 0 - 096 mg/dL   HDL 64 35 - 70 mg/dL   LDL Cholesterol 91 mg/dL  Hemoglobin E4V     Status: None   Collection Time: 04/05/17 12:00 AM  Result Value Ref Range   Hemoglobin A1C 10.6    Her A1c was 7.3% on 01/01/2017.  03/29/2016 A1c was 10.3%.  Assessment & Plan:   1. Uncontrolled type 2 diabetes mellitus with complication, without long-term current use of insulin (HCC)  - Patient has controlled symptomatic type 2 DM Duration unknown. - She came with loss of control of her diabetes. Her  recent A1c has increased to 10.6% from 7.3% and she has gained more than 10 pounds.  - Labs show normal renal function.  - She has no reported gross complications from diabetes however patient remains at a high risk for more acute and chronic complications of diabetes which include CAD, CVA, CKD, retinopathy, and neuropathy.  -  She does not understand the high risk she has, these are all discussed with her care manager from the group home.  - I have counseled the patient on diet management by adopting a carbohydrate restricted/protein rich diet.  - Suggestion is made for patient to avoid simple carbohydrates   from their diet including Cakes , Desserts, Ice Cream,  Soda (  diet and regular) , Sweet Tea , Candies,  Chips, Cookies, Artificial Sweeteners,   and "Sugar-free" Products . This will help patient to have stable blood glucose profile and potentially avoid unintended weight gain.  - I encouraged the patient to switch to  unprocessed or minimally processed complex starch and increased  protein intake (animal or plant source), fruits, and vegetables.  - Patient is advised to stick to a routine mealtimes to eat 3 meals  a day and avoid unnecessary snacks ( to snack only to correct hypoglycemia).  - The patient will be scheduled with Norm SaltPenny Crumpton, RDN, CDE for individualized DM education.  - I have approached patient with the following individualized plan to manage diabetes and patient agrees:   -She is losing control of her diabetes, mainly due to dietary indiscretion and recent weight gain. - It is essential to keep treatment regimen simple for this patient with significant MR, the best ways to avoid processed carbohydrates from her diet. - I will proceed to increase her basal insulin Lantus to 40 units daily with breakfast associated with monitoring of blood glucose 2 times a day-before breakfast and at bedtime.  -Patient's care taker is encouraged to call clinic for blood glucose levels  less than 70 or above 300 mg /dl.  - I will continue low  -dose metformin 500 mg by mouth twice a day.   - She is not suitable candidate for SGLt2 inhibitors nor incretin therapy. - Patient specific target  A1c;  LDL, HDL, Triglycerides, and  Waist Circumference were discussed in detail.  2) BP/HT : controlled, continue current medications including ACEI/ARB. 3) Lipids/HPL:   control unknown, continue statins. 4)  Weight/Diet: she is light build,  , exercise, and detailed carbohydrates information provided.  5) Chronic Care/Health Maintenance:  -Patient is on ACEI/ARB and Statin medications and encouraged to continue to follow up with Ophthalmology, Podiatrist at least yearly or according to recommendations, and advised to   stay away from smoking. I have recommended yearly flu vaccine and pneumonia vaccination at least every 5 years;  60 minutes of walking 4 days a week is recommended for her , and  sleep for at least 7 hours a day.  - 30 minutes of time was spent on the care of this patient , 50% of which was applied for counseling on diabetes complications and their preventions.  - Patient to bring meter and  blood glucose logs during her next visit.   - I advised patient to maintain close follow up with Louie Bostonapper, David B., MD for primary care needs.  Follow up plan: - Return in about 10 weeks (around 07/05/2017) for follow up with pre-visit labs, meter, and logs.  Marquis LunchGebre Larkin Morelos, MD Phone: [phone]  Fax: [phone]   04/26/2017, 11:35 AM

## 2017-05-20 ENCOUNTER — Other Ambulatory Visit: Payer: Self-pay | Admitting: "Endocrinology

## 2017-07-05 ENCOUNTER — Ambulatory Visit: Payer: Medicare Other | Admitting: "Endocrinology

## 2017-07-08 ENCOUNTER — Other Ambulatory Visit: Payer: Self-pay | Admitting: "Endocrinology

## 2017-07-31 LAB — RENAL FUNCTION PANEL
Albumin: 3.5 g/dL — ABNORMAL LOW (ref 3.6–5.1)
BUN: 15 mg/dL (ref 7–25)
CO2: 31 mmol/L (ref 20–32)
Calcium: 9.7 mg/dL (ref 8.6–10.4)
Chloride: 103 mmol/L (ref 98–110)
Creat: 0.9 mg/dL (ref 0.50–1.05)
Glucose, Bld: 91 mg/dL (ref 65–99)
Phosphorus: 4.3 mg/dL (ref 2.5–4.5)
Potassium: 4.7 mmol/L (ref 3.5–5.3)
Sodium: 140 mmol/L (ref 135–146)

## 2017-07-31 LAB — HEMOGLOBIN A1C
Hgb A1c MFr Bld: 7.1 % of total Hgb — ABNORMAL HIGH (ref <5.7)
Mean Plasma Glucose: 157 (calc)
eAG (mmol/L): 8.7 (calc)

## 2017-08-01 ENCOUNTER — Ambulatory Visit: Payer: Medicare Other | Admitting: "Endocrinology

## 2017-09-02 ENCOUNTER — Other Ambulatory Visit: Payer: Self-pay | Admitting: "Endocrinology

## 2018-08-05 ENCOUNTER — Ambulatory Visit (INDEPENDENT_AMBULATORY_CARE_PROVIDER_SITE_OTHER): Payer: Medicare Other | Admitting: "Endocrinology

## 2018-08-05 ENCOUNTER — Encounter: Payer: Self-pay | Admitting: "Endocrinology

## 2018-08-05 VITALS — BP 136/81 | HR 63 | Ht 60.25 in | Wt 172.0 lb

## 2018-08-05 DIAGNOSIS — E1165 Type 2 diabetes mellitus with hyperglycemia: Secondary | ICD-10-CM | POA: Diagnosis not present

## 2018-08-05 DIAGNOSIS — E782 Mixed hyperlipidemia: Secondary | ICD-10-CM

## 2018-08-05 DIAGNOSIS — I10 Essential (primary) hypertension: Secondary | ICD-10-CM | POA: Diagnosis not present

## 2018-08-05 DIAGNOSIS — E118 Type 2 diabetes mellitus with unspecified complications: Secondary | ICD-10-CM | POA: Diagnosis not present

## 2018-08-05 DIAGNOSIS — IMO0002 Reserved for concepts with insufficient information to code with codable children: Secondary | ICD-10-CM

## 2018-08-05 MED ORDER — INSULIN GLARGINE 100 UNIT/ML SOLOSTAR PEN: 60.0000 [IU] | PEN_INJECTOR | Freq: Every day | SUBCUTANEOUS | 11 refills | Status: DC

## 2018-08-05 NOTE — Patient Instructions (Signed)

## 2018-08-05 NOTE — Progress Notes (Signed)
Endocrinology follow-up note   Subjective:    Patient ID: Ashley Blanchard, female    DOB: 24-Jul-1966.  She is being seen in follow-up for history of uncontrolled type 2 diabetes, hyperlipidemia, hypertension.   PMD:   Louie Bostonapper, David B., MD  Past Medical History:  Diagnosis Date  . Chronic anemia   . Diabetes mellitus, type II (HCC)   . Hearing loss   . Hyperlipidemia   . Hypertension   . Mild mental retardation    Past Surgical History:  Procedure Laterality Date  . CATARACT EXTRACTION    . RETINAL DETACHMENT SURGERY     Social History   Socioeconomic History  . Marital status: Single    Spouse name: Not on file  . Number of children: Not on file  . Years of education: Not on file  . Highest education level: Not on file  Occupational History  . Not on file  Social Needs  . Financial resource strain: Not on file  . Food insecurity:    Worry: Not on file    Inability: Not on file  . Transportation needs:    Medical: Not on file    Non-medical: Not on file  Tobacco Use  . Smoking status: Never Smoker  . Smokeless tobacco: Never Used  Substance and Sexual Activity  . Alcohol use: No  . Drug use: No  . Sexual activity: Not on file  Lifestyle  . Physical activity:    Days per week: Not on file    Minutes per session: Not on file  . Stress: Not on file  Relationships  . Social connections:    Talks on phone: Not on file    Gets together: Not on file    Attends religious service: Not on file    Active member of club or organization: Not on file    Attends meetings of clubs or organizations: Not on file    Relationship status: Not on file  Other Topics Concern  . Not on file  Social History Narrative  . Not on file   Outpatient Encounter Medications as of 08/05/2018  Medication Sig  . aspirin EC 81 MG tablet Take 81 mg by mouth daily.  Marland Kitchen. diltiazem (DILACOR XR) 180 MG 24 hr capsule Take 180 mg by mouth daily.  . furosemide (LASIX) 20 MG tablet Take 20 mg by  mouth.  . linagliptin (TRADJENTA) 5 MG TABS tablet Take 5 mg by mouth daily.  . risperiDONE (RISPERDAL) 0.5 MG tablet Take 0.5 mg by mouth at bedtime.  . sertraline (ZOLOFT) 100 MG tablet Take 100 mg by mouth daily.  . traZODone (DESYREL) 50 MG tablet Take 50 mg by mouth at bedtime.  . Insulin Glargine (LANTUS SOLOSTAR) 100 UNIT/ML Solostar Pen Inject 60 Units into the skin daily with breakfast.  . latanoprost (XALATAN) 0.005 % ophthalmic solution 1 drop at bedtime.  . ramipril (ALTACE) 5 MG capsule Take 5 mg by mouth daily.  . traZODone (DESYREL) 50 MG tablet Take 50 mg by mouth at bedtime.  . [DISCONTINUED] atorvastatin (LIPITOR) 20 MG tablet Take 20 mg by mouth daily.  . [DISCONTINUED] ciprofloxacin (CIPRO) 500 MG tablet Take 1 tablet (500 mg total) by mouth 2 (two) times daily. For 5 more days  . [DISCONTINUED] LANTUS SOLOSTAR 100 UNIT/ML Solostar Pen INJECT 40 UNITS SUBCUTANEOUSLY WITH BREAKFAST (Patient taking differently: at bedtime. 50 units qam and 29 units qhs)  . [DISCONTINUED] metFORMIN (GLUCOPHAGE) 500 MG tablet TAKE 1 TABLET BY MOUTH TWICE  DAILY.   No facility-administered encounter medications on file as of 08/05/2018.    ALLERGIES: No Known Allergies VACCINATION STATUS:  There is no immunization history on file for this patient.  Diabetes  She presents for her follow-up diabetic visit. She has type 2 diabetes mellitus. Onset time: She is not sure when she was diagnosed with diabetes. Her disease course has been improving (Her recent A1c is higher at 10.3% in May 2017.). There are no hypoglycemic associated symptoms. Pertinent negatives for hypoglycemia include no confusion, headaches, pallor or seizures. Associated symptoms include fatigue, polydipsia and polyuria. Pertinent negatives for diabetes include no chest pain and no polyphagia. There are no hypoglycemic complications. Symptoms are improving. There are no diabetic complications. Risk factors for coronary artery disease  include diabetes mellitus, hypertension and dyslipidemia. Current diabetic treatment includes oral agent (monotherapy). Compliance with diabetes treatment: She is in a group home due to failure to care for self. Her weight is increasing steadily. She is following a generally unhealthy diet. When asked about meal planning, she reported none. She has not had a previous visit with a dietitian. She rarely participates in exercise. Her home blood glucose trend is fluctuating minimally. Her overall blood glucose range is 180-200 mg/dl. An ACE inhibitor/angiotensin II receptor blocker is being taken.    Review of Systems  Constitutional: Positive for fatigue. Negative for unexpected weight change.  HENT: Negative for trouble swallowing and voice change.   Eyes: Negative for visual disturbance.  Respiratory: Negative for cough, shortness of breath and wheezing.   Cardiovascular: Negative for chest pain, palpitations and leg swelling.  Gastrointestinal: Negative for diarrhea, nausea and vomiting.  Endocrine: Positive for polydipsia and polyuria. Negative for cold intolerance, heat intolerance and polyphagia.  Genitourinary: Positive for frequency. Negative for dysuria and flank pain.  Musculoskeletal: Negative for arthralgias and myalgias.  Skin: Negative for color change, pallor, rash and wound.  Neurological: Negative for seizures and headaches.  Psychiatric/Behavioral: Negative for confusion and suicidal ideas.    Objective:    BP 136/81   Pulse 63   Ht 5' 0.25" (1.53 m)   Wt 172 lb (78 kg)   BMI 33.31 kg/m   Wt Readings from Last 3 Encounters:  08/05/18 172 lb (78 kg)  04/26/17 150 lb (68 kg)  04/15/17 152 lb (68.9 kg)    Physical Exam  HENT:  Head: Normocephalic and atraumatic.  Eyes: EOM are normal.  Neck: Normal range of motion. Neck supple. No tracheal deviation present. No thyromegaly present.  Cardiovascular: Normal rate and regular rhythm.  Pulmonary/Chest: Effort normal and  breath sounds normal.  Abdominal: Soft. Bowel sounds are normal. There is no tenderness. There is no guarding.  Musculoskeletal: Normal range of motion. She exhibits no edema.  Neurological: She is alert. No cranial nerve deficit. Coordination normal.  She is hard of hearing. She has reported mental retardation accompanied by her care manager.  Skin: Skin is warm and dry. No rash noted. No erythema. No pallor.  Psychiatric: She has a normal mood and affect.    Labs from July 30, 2017 showed A1c of 7.1%. Her A1c was 7.3% on 01/01/2017.  03/29/2016 A1c was 10.3%.  Assessment & Plan:   1. Uncontrolled type 2 diabetes mellitus with complication, without long-term current use of insulin (HCC)  -She has chronically uncontrolled type 2 diabetes of unknown duration.    -She missed her appointment since June 2018.   -According to her records, her last A1c was 7.1% from September 2018.  -  Labs show normal renal function.  - She has no reported gross complications from diabetes however patient remains at a high risk for more acute and chronic complications of diabetes which include CAD, CVA, CKD, retinopathy, and neuropathy.  -  She does not understand the high risk she has, these are all discussed with her care manager from the group home.  - I have counseled the patient on diet management by adopting a carbohydrate restricted/protein rich diet.  -  Suggestion is made for her to avoid simple carbohydrates  from her diet including Cakes, Sweet Desserts / Pastries, Ice Cream, Soda (diet and regular), Sweet Tea, Candies, Chips, Cookies, Store Bought Juices, Alcohol in Excess of  1-2 drinks a day, Artificial Sweeteners, and "Sugar-free" Products. This will help patient to have stable blood glucose profile and potentially avoid unintended weight gain.   - I encouraged the patient and her aide to switch her diet to  unprocessed or minimally processed complex starch and increased protein intake  (animal or plant source), fruits, and vegetables.  - Patient is advised to stick to a routine mealtimes to eat 3 meals  a day and avoid unnecessary snacks ( to snack only to correct hypoglycemia).  - The patient will be scheduled with Ashley Blanchard, Ashley Blanchard, Ashley Blanchard for individualized DM education.  - I have approached patient with the following individualized plan to manage diabetes and patient agrees:   -He does not have recent A1c.  I have suggested to simplify her treatment to Lantus 60 units with breakfast daily associated with monitoring of blood glucose 2 times daily-before breakfast and at bedtime.  Continue Tradjenta 5 mg p.o. once daily with breakfast.   - It is essential to keep treatment regimen simple for this patient with significant MR, the best way is to avoid processed carbohydrates from her diet.  -Patient's care taker is encouraged to call clinic for blood glucose levels less than 70 or above 300 mg /dl.   - She is not suitable candidate for SGLt2 inhibitors nor incretin therapy. - Patient specific target  A1c;  LDL, HDL, Triglycerides, and  Waist Circumference were discussed in detail.  2) BP/HT : Her blood pressure is controlled to target.  She is advised to continue her current blood pressure medications including ramipril 5 mg p.o. daily with breakfast.    3) Lipids/HPL: No recent fasting lipid panel.  She will continue to benefit from atorvastatin 20 mg p.o. nightly.    4)  Weight/Diet: she has gained weight significantly since last visit.   exercise, and detailed carbohydrates information provided.  5) Chronic Care/Health Maintenance:  -Patient is on ACEI/ARB and Statin medications and encouraged to continue to follow up with Ophthalmology, Podiatrist at least yearly or according to recommendations, and advised to   stay away from smoking. I have recommended yearly flu vaccine and pneumonia vaccination at least every 5 years;  60 minutes of walking 4 days a week is  recommended for her , and  sleep for at least 7 hours a day. - Patient to bring meter and  blood glucose logs during her next visit.   - I advised patient to maintain close follow up with Louie Boston., MD for primary care needs.  - Time spent with the patient: 25 min, of which >50% was spent in reviewing her blood glucose logs , discussing her hypo- and hyper-glycemic episodes, reviewing her current and  previous labs and insulin doses and developing a plan to avoid hypo- and hyper-glycemia.  Please refer to Patient Instructions for Blood Glucose Monitoring and Insulin/Medications Dosing Guide"  in media tab for additional information. Ashley Blanchard participated in the discussions, expressed understanding, and voiced agreement with the above plans.  All questions were answered to her satisfaction. she is encouraged to contact clinic should she have any questions or concerns prior to her return visit.   Follow up plan: - Return in about 4 months (around 12/05/2018) for Follow up with Pre-visit Labs, Meter, and Logs.  Ashley Lunch, MD Phone: [phone]  Fax: [phone]   08/05/2018, 6:40 PM

## 2018-09-22 ENCOUNTER — Other Ambulatory Visit: Payer: Self-pay | Admitting: "Endocrinology

## 2018-10-13 ENCOUNTER — Other Ambulatory Visit: Payer: Self-pay | Admitting: "Endocrinology

## 2018-12-05 ENCOUNTER — Ambulatory Visit: Payer: Medicare Other | Admitting: "Endocrinology

## 2018-12-11 ENCOUNTER — Other Ambulatory Visit: Payer: Self-pay | Admitting: "Endocrinology

## 2018-12-21 ENCOUNTER — Other Ambulatory Visit: Payer: Self-pay

## 2018-12-21 ENCOUNTER — Encounter (HOSPITAL_COMMUNITY): Payer: Self-pay | Admitting: Emergency Medicine

## 2018-12-21 ENCOUNTER — Emergency Department (HOSPITAL_COMMUNITY)
Admission: EM | Admit: 2018-12-21 | Discharge: 2018-12-22 | Disposition: A | Discharge status: Home / Self Care | Payer: Medicare Other | Attending: Emergency Medicine | Admitting: Emergency Medicine

## 2018-12-21 DIAGNOSIS — Z7982 Long term (current) use of aspirin: Secondary | ICD-10-CM | POA: Diagnosis not present

## 2018-12-21 DIAGNOSIS — R739 Hyperglycemia, unspecified: Secondary | ICD-10-CM

## 2018-12-21 DIAGNOSIS — D649 Anemia, unspecified: Secondary | ICD-10-CM | POA: Insufficient documentation

## 2018-12-21 DIAGNOSIS — E1165 Type 2 diabetes mellitus with hyperglycemia: Secondary | ICD-10-CM | POA: Insufficient documentation

## 2018-12-21 DIAGNOSIS — N179 Acute kidney failure, unspecified: Secondary | ICD-10-CM | POA: Diagnosis not present

## 2018-12-21 DIAGNOSIS — Z79899 Other long term (current) drug therapy: Secondary | ICD-10-CM | POA: Diagnosis not present

## 2018-12-21 DIAGNOSIS — Z794 Long term (current) use of insulin: Secondary | ICD-10-CM | POA: Diagnosis not present

## 2018-12-21 DIAGNOSIS — I10 Essential (primary) hypertension: Secondary | ICD-10-CM | POA: Insufficient documentation

## 2018-12-21 DIAGNOSIS — F70 Mild intellectual disabilities: Secondary | ICD-10-CM | POA: Insufficient documentation

## 2018-12-21 DIAGNOSIS — N3000 Acute cystitis without hematuria: Secondary | ICD-10-CM

## 2018-12-21 LAB — BASIC METABOLIC PANEL
Anion gap: 7 (ref 5–15)
BUN: 18 mg/dL (ref 6–20)
CO2: 27 mmol/L (ref 22–32)
Calcium: 8.9 mg/dL (ref 8.9–10.3)
Chloride: 98 mmol/L (ref 98–111)
Creatinine, Ser: 1.36 mg/dL — ABNORMAL HIGH (ref 0.44–1.00)
GFR calc Af Amer: 51 mL/min — ABNORMAL LOW (ref >60)
GFR calc non Af Amer: 44 mL/min — ABNORMAL LOW (ref >60)
Glucose, Bld: 359 mg/dL — ABNORMAL HIGH (ref 70–99)
Potassium: 4.1 mmol/L (ref 3.5–5.1)
Sodium: 132 mmol/L — ABNORMAL LOW (ref 135–145)

## 2018-12-21 LAB — CBC WITH DIFFERENTIAL/PLATELET
Abs Immature Granulocytes: 0.04 10*3/uL (ref 0.00–0.07)
Basophils Absolute: 0 10*3/uL (ref 0.0–0.1)
Basophils Relative: 0 %
Eosinophils Absolute: 0 10*3/uL (ref 0.0–0.5)
Eosinophils Relative: 1 %
HCT: 33.7 % — ABNORMAL LOW (ref 36.0–46.0)
Hemoglobin: 9.7 g/dL — ABNORMAL LOW (ref 12.0–15.0)
Immature Granulocytes: 1 %
Lymphocytes Relative: 17 %
Lymphs Abs: 1.1 10*3/uL (ref 0.7–4.0)
MCH: 23 pg — ABNORMAL LOW (ref 26.0–34.0)
MCHC: 28.8 g/dL — ABNORMAL LOW (ref 30.0–36.0)
MCV: 79.9 fL — ABNORMAL LOW (ref 80.0–100.0)
Monocytes Absolute: 1 10*3/uL (ref 0.1–1.0)
Monocytes Relative: 16 %
Neutro Abs: 4 10*3/uL (ref 1.7–7.7)
Neutrophils Relative %: 65 %
Platelets: 172 10*3/uL (ref 150–400)
RBC: 4.22 MIL/uL (ref 3.87–5.11)
RDW: 16.3 % — ABNORMAL HIGH (ref 11.5–15.5)
WBC: 6.1 10*3/uL (ref 4.0–10.5)
nRBC: 0 % (ref 0.0–0.2)

## 2018-12-21 LAB — CBG MONITORING, ED
Glucose-Capillary: 262 mg/dL — ABNORMAL HIGH (ref 70–99)
Glucose-Capillary: 407 mg/dL — ABNORMAL HIGH (ref 70–99)

## 2018-12-21 MED ORDER — SODIUM CHLORIDE 0.9 % IV BOLUS: 1000.0000 mL | Freq: Once | INTRAVENOUS | Status: DC

## 2018-12-21 MED ORDER — INSULIN ASPART 100 UNIT/ML ~~LOC~~ SOLN
8.0000 [IU] | Freq: Once | SUBCUTANEOUS | Status: AC
  Administered 2018-12-21: 8 [IU] via INTRAVENOUS
  Filled 2018-12-21: qty 1

## 2018-12-21 MED ORDER — SODIUM CHLORIDE 0.9 % IV BOLUS
1000.0000 mL | Freq: Once | INTRAVENOUS | Status: AC
  Administered 2018-12-21: 1000 mL via INTRAVENOUS

## 2018-12-21 MED ORDER — INSULIN ASPART 100 UNIT/ML ~~LOC~~ SOLN: 10.0000 [IU] | Freq: Once | SUBCUTANEOUS | Status: DC

## 2018-12-21 NOTE — ED Triage Notes (Signed)
Patient from [facility] rest home. EMS called out for hyperglycemia. EMS reading 409. EMS giving patient a normal saline bolus. Patient takes 70 units of Lantus in the morning. DM history. Patient does have significant hearing loss. Facility reports patient's blood sugar reading high on their machine.

## 2018-12-21 NOTE — ED Provider Notes (Signed)
[facility] EMERGENCY DEPARTMENT Provider Note   CSN: [id number] Arrival date & time: 12/21/18  2214     History   Chief Complaint Chief Complaint  Patient presents with  . Hyperglycemia    HPI Ashley Blanchard is a 53 y.o. female who presents with hyperglycemia.  Past medical history significant for mental retardation, hypertension, hyperlipidemia, insulin-dependent diabetes, anemia, hearing loss.  The patient's sister is at bedside who helps provide history.  The patient lives at an assisted living facility and during a routine glucose check tonight the glucometer read that the blood sugar was too high to be read.  Patient has not had any other symptoms.  Specifically she has not recently been ill.  No fevers, chest pain, shortness of breath, coughing, back pain, abdominal pain, nausea, vomiting, diarrhea, urinary symptoms.  She does see an endocrinologist.  Her sister states that her other sister took the patient out to eat yesterday for breakfast and lunch and she does not follow a diabetic diet.  HPI  Past Medical History:  Diagnosis Date  . Chronic anemia   . Diabetes mellitus, type II (HCC)   . Hearing loss   . Hyperlipidemia   . Hypertension   . Mild mental retardation     Patient Active Problem List   Diagnosis Date Noted  . Sepsis secondary to UTI (HCC) 10/22/2016  . Chronic anemia 10/22/2016  . Lactic acidosis 10/22/2016  . Type 2 diabetes mellitus (HCC) 10/22/2016  . Hypomagnesemia 10/22/2016  . Uncontrolled type 2 diabetes mellitus with complication, without long-term current use of insulin (HCC) 08/06/2016  . Mixed hyperlipidemia 08/06/2016  . Essential hypertension, benign 08/06/2016    Past Surgical History:  Procedure Laterality Date  . CATARACT EXTRACTION    . RETINAL DETACHMENT SURGERY       OB History   No obstetric history on file.      Home Medications    Prior to Admission medications   Medication Sig Start Date End Date Taking? Authorizing  Provider  aspirin EC 81 MG tablet Take 81 mg by mouth daily.    [provider]  diltiazem (DILACOR XR) 180 MG 24 hr capsule Take 180 mg by mouth daily.    [provider]  FORA LANCETS MISC USE TO TEST BLOOD SUGAR 3 TIMES DAILY. 12/11/18   Roma KayserNida, Gebreselassie W, MD  FORA [id number] BLOOD GLUCOSE TEST test strip USE TO TEST BLOOD SUGAR 3 TIMES DAILY. BEFORE BREAKFAST, SUPPER AND BEDTIME. 10/14/18   Nida, Denman GeorgeGebreselassie W, MD  furosemide (LASIX) 20 MG tablet Take 20 mg by mouth.    [provider]  LANTUS SOLOSTAR 100 UNIT/ML Solostar Pen INJECT 70 UNITS IN THE MORNING WITH BREAKFAST. 09/22/18   Roma KayserNida, Gebreselassie W, MD  latanoprost (XALATAN) 0.005 % ophthalmic solution 1 drop at bedtime.    [provider]  linagliptin (TRADJENTA) 5 MG TABS tablet Take 5 mg by mouth daily.    [provider]  ramipril (ALTACE) 5 MG capsule Take 5 mg by mouth daily.    [provider]  risperiDONE (RISPERDAL) 0.5 MG tablet Take 0.5 mg by mouth at bedtime.    [provider]  sertraline (ZOLOFT) 100 MG tablet Take 100 mg by mouth daily.    [provider]  traZODone (DESYREL) 50 MG tablet Take 50 mg by mouth at bedtime.    [provider]  traZODone (DESYREL) 50 MG tablet Take 50 mg by mouth at bedtime.    [provider]  Family History Family History  Problem Relation Age of Onset  . Arthritis Mother   . Hypertension Mother   . Clotting disorder Mother   . Pulmonary embolism Mother   . Alcohol abuse Father   . Diabetes Brother     Social History Social History   Tobacco Use  . Smoking status: Never Smoker  . Smokeless tobacco: Never Used  Substance Use Topics  . Alcohol use: No  . Drug use: No     Allergies   Patient has no known allergies.   Review of Systems Review of Systems  Constitutional: Negative for fever.  HENT: Positive for hearing loss (Chronic).   Respiratory: Negative for cough and shortness  of breath.   Cardiovascular: Negative for chest pain.  Gastrointestinal: Negative for abdominal pain, diarrhea, nausea and vomiting.  Endocrine:       +hyperglycemia   Genitourinary: Negative for dysuria.  All other systems reviewed and are negative.    Physical Exam Updated Vital Signs BP (!) 157/73   Pulse 90   Temp 98.2 F (36.8 C) (Oral)   Resp 18   Ht 5' 0.25" (1.53 m)   Wt 78 kg   SpO2 96%   BMI 33.31 kg/m   Physical Exam Vitals signs and nursing note reviewed.  Constitutional:      General: She is not in acute distress.    Appearance: Normal appearance. She is well-developed.     Comments: Calm and cooperative.  Very hard of hearing.  Hearing aid in her right ear  HENT:     Head: Normocephalic and atraumatic.  Eyes:     General: No scleral icterus.       Right eye: No discharge.        Left eye: No discharge.     Conjunctiva/sclera: Conjunctivae normal.     Pupils: Pupils are equal, round, and reactive to light.  Neck:     Musculoskeletal: Normal range of motion.  Cardiovascular:     Rate and Rhythm: Normal rate and regular rhythm.  Pulmonary:     Effort: Pulmonary effort is normal. No respiratory distress.     Breath sounds: Normal breath sounds.  Abdominal:     General: There is no distension.     Palpations: Abdomen is soft.     Tenderness: There is no abdominal tenderness.  Skin:    General: Skin is warm and dry.  Neurological:     Mental Status: She is alert and oriented to person, place, and time.  Psychiatric:        Behavior: Behavior normal.      ED Treatments / Results  Labs (all labs ordered are listed, but only abnormal results are displayed) Labs Reviewed  BASIC METABOLIC PANEL - Abnormal; Notable for the following components:      Result Value   Sodium 132 (*)    Glucose, Bld 359 (*)    Creatinine, Ser 1.36 (*)    GFR calc non Af Amer 44 (*)    GFR calc Af Amer 51 (*)    All other components within normal limits  CBC WITH  DIFFERENTIAL/PLATELET - Abnormal; Notable for the following components:   Hemoglobin 9.7 (*)    HCT 33.7 (*)    MCV 79.9 (*)    MCH 23.0 (*)    MCHC 28.8 (*)    RDW 16.3 (*)    All other components within normal limits  CBG MONITORING, ED - Abnormal; Notable for the following components:  Glucose-Capillary 407 (*)    All other components within normal limits  CBG MONITORING, ED - Abnormal; Notable for the following components:   Glucose-Capillary 262 (*)    All other components within normal limits  URINALYSIS, ROUTINE W REFLEX MICROSCOPIC    EKG None  Radiology No results found.  Procedures Procedures (including critical care time)  Medications Ordered in ED Medications  sodium chloride 0.9 % bolus 500 mL (500 mLs Intravenous New Bag/Given 12/22/18 0014)  sodium chloride 0.9 % bolus 1,000 mL (0 mLs Intravenous Stopped 12/21/18 2348)  insulin aspart (novoLOG) injection 8 Units (8 Units Intravenous Given 12/21/18 2317)     Initial Impression / Assessment and Plan / ED Course  I have reviewed the triage vital signs and the nursing notes.  Pertinent labs & imaging results that were available during my care of the patient were reviewed by me and considered in my medical decision making (see chart for details).  53 year old female presents with hyperglycemia.  She is hypertensive but otherwise vital signs are normal.  The patient denies any specific complaints.  She has not had any pain or vomiting.  Her sister provides most of the history and denies that she is been unwell recently.  Will check blood work, UA to rule out DKA and give fluids and insulin.  CBC is remarkable for anemia which is at 9.7.  BMP is remarkable for hyperglycemia and mild AKI.  Her repeat CBG is 262.  UA is pending.   UA still pending at shift change. Care transferred to Dr. Lynelle Doctor. Anticipate d/c home with PCP f/u.   Final Clinical Impressions(s) / ED Diagnoses   Final diagnoses:  Hyperglycemia    Anemia, unspecified type  AKI (acute kidney injury) [facility])    ED Discharge Orders    None       Bethel Born, PA-C 12/22/18 Brooke Bonito, MD 12/22/18 [phone]

## 2018-12-22 ENCOUNTER — Telehealth: Payer: Self-pay

## 2018-12-22 ENCOUNTER — Other Ambulatory Visit: Payer: Self-pay | Admitting: "Endocrinology

## 2018-12-22 DIAGNOSIS — E118 Type 2 diabetes mellitus with unspecified complications: Principal | ICD-10-CM

## 2018-12-22 DIAGNOSIS — E1165 Type 2 diabetes mellitus with hyperglycemia: Secondary | ICD-10-CM | POA: Diagnosis not present

## 2018-12-22 DIAGNOSIS — IMO0002 Reserved for concepts with insufficient information to code with codable children: Secondary | ICD-10-CM

## 2018-12-22 DIAGNOSIS — I10 Essential (primary) hypertension: Secondary | ICD-10-CM

## 2018-12-22 DIAGNOSIS — E782 Mixed hyperlipidemia: Secondary | ICD-10-CM

## 2018-12-22 LAB — URINALYSIS, ROUTINE W REFLEX MICROSCOPIC
Bilirubin Urine: NEGATIVE
Glucose, UA: >=500 mg/dL — AB
Hgb urine dipstick: NEGATIVE
Ketones, ur: NEGATIVE mg/dL
Leukocytes, UA: NEGATIVE
Nitrite: NEGATIVE
Protein, ur: NEGATIVE mg/dL
Specific Gravity, Urine: 1.01 (ref 1.005–1.030)
pH: 6 (ref 5.0–8.0)

## 2018-12-22 LAB — URINALYSIS, MICROSCOPIC (REFLEX)

## 2018-12-22 LAB — CBG MONITORING, ED
Glucose-Capillary: 199 mg/dL — ABNORMAL HIGH (ref 70–99)
Glucose-Capillary: 194 mg/dL — ABNORMAL HIGH (ref 70–99)

## 2018-12-22 MED ORDER — CEPHALEXIN 500 MG PO CAPS: 500.0000 mg | ORAL_CAPSULE | Freq: Three times a day (TID) | ORAL | 0 refills | Status: DC

## 2018-12-22 MED ORDER — CEPHALEXIN 500 MG PO CAPS
500.0000 mg | ORAL_CAPSULE | Freq: Once | ORAL | Status: AC
  Administered 2018-12-22: 500 mg via ORAL
  Filled 2018-12-22: qty 1

## 2018-12-22 MED ORDER — SODIUM CHLORIDE 0.9 % IV BOLUS
500.0000 mL | Freq: Once | INTRAVENOUS | Status: AC
  Administered 2018-12-22: 500 mL via INTRAVENOUS

## 2018-12-22 NOTE — Discharge Instructions (Addendum)
Make sure she drinks plenty of fluids.  Please monitor her blood sugar closely over the next couple days.  Please watch what she eats make sure she is following her diabetic diet.  Her urine culture should result in about 2 days, have her doctor check those results and decide if she needs to be on the antibiotics longer.  Return if she seems worse.

## 2018-12-22 NOTE — ED Notes (Signed)
Fluids placed back on IV pump.

## 2018-12-22 NOTE — Telephone Encounter (Signed)
Ashley Blanchard, CMA  

## 2018-12-23 LAB — LIPID PANEL
Cholesterol: 220 — AB (ref 0–200)
HDL: 44 (ref 35–70)
LDL Cholesterol: 149
Triglycerides: 136 (ref 40–160)

## 2018-12-23 LAB — HEMOGLOBIN A1C: Hemoglobin A1C: 12.1

## 2018-12-24 LAB — URINE CULTURE
Culture: >=100000 — AB
Special Requests: NORMAL

## 2018-12-25 ENCOUNTER — Telehealth: Payer: Self-pay | Admitting: *Deleted

## 2018-12-25 NOTE — Telephone Encounter (Signed)
Post ED Visit - Positive Culture Follow-up  Culture report reviewed by antimicrobial stewardship pharmacist:  []  Enzo Bi, Pharm.D. []  Celedonio Miyamoto, Pharm.D., BCPS AQ-ID []  Garvin Fila, Pharm.D., BCPS []  Georgina Pillion, Pharm.D., BCPS []  [place], [address].D., BCPS, AAHIVP []  Estella Husk, Pharm.D., BCPS, AAHIVP []  Lysle Pearl, PharmD, BCPS []  Phillips Climes, PharmD, BCPS []  Agapito Games, PharmD, BCPS []  Verlan Friends, PharmD Mervyn Gay, PharmD  Positive urine culture Treated with Cephalexin, organism sensitive to the same and no further patient follow-up is required at this time.  Virl Axe [facility] 12/25/2018, 11:34 AM

## 2018-12-30 ENCOUNTER — Ambulatory Visit (INDEPENDENT_AMBULATORY_CARE_PROVIDER_SITE_OTHER): Payer: Medicare Other | Admitting: "Endocrinology

## 2018-12-30 ENCOUNTER — Encounter: Payer: Self-pay | Admitting: "Endocrinology

## 2018-12-30 VITALS — BP 118/72 | HR 97 | Ht 60.03 in | Wt 179.0 lb

## 2018-12-30 DIAGNOSIS — E782 Mixed hyperlipidemia: Secondary | ICD-10-CM

## 2018-12-30 DIAGNOSIS — I10 Essential (primary) hypertension: Secondary | ICD-10-CM | POA: Diagnosis not present

## 2018-12-30 DIAGNOSIS — E118 Type 2 diabetes mellitus with unspecified complications: Secondary | ICD-10-CM | POA: Diagnosis not present

## 2018-12-30 DIAGNOSIS — E1165 Type 2 diabetes mellitus with hyperglycemia: Secondary | ICD-10-CM | POA: Diagnosis not present

## 2018-12-30 DIAGNOSIS — IMO0002 Reserved for concepts with insufficient information to code with codable children: Secondary | ICD-10-CM

## 2018-12-30 MED ORDER — INSULIN ASPART 100 UNIT/ML FLEXPEN: 4.0000 [IU] | PEN_INJECTOR | Freq: Three times a day (TID) | SUBCUTANEOUS | 11 refills | Status: DC

## 2018-12-30 NOTE — Patient Instructions (Signed)

## 2018-12-30 NOTE — Progress Notes (Signed)
Endocrinology follow-up note   Subjective:    Patient ID: Ashley Blanchard, female    DOB: 18-Nov-1965.  She is being seen in follow-up for history of uncontrolled type 2 diabetes, hyperlipidemia, hypertension.   PMD:   Louie Boston., MD  Past Medical History:  Diagnosis Date  . Chronic anemia   . Diabetes mellitus, type II (HCC)   . Hearing loss   . Hyperlipidemia   . Hypertension   . Mild mental retardation    Past Surgical History:  Procedure Laterality Date  . CATARACT EXTRACTION    . RETINAL DETACHMENT SURGERY     Social History   Socioeconomic History  . Marital status: Single    Spouse name: Not on file  . Number of children: Not on file  . Years of education: Not on file  . Highest education level: Not on file  Occupational History  . Not on file  Social Needs  . Financial resource strain: Not on file  . Food insecurity:    Worry: Not on file    Inability: Not on file  . Transportation needs:    Medical: Not on file    Non-medical: Not on file  Tobacco Use  . Smoking status: Never Smoker  . Smokeless tobacco: Never Used  Substance and Sexual Activity  . Alcohol use: No  . Drug use: No  . Sexual activity: Not on file  Lifestyle  . Physical activity:    Days per week: Not on file    Minutes per session: Not on file  . Stress: Not on file  Relationships  . Social connections:    Talks on phone: Not on file    Gets together: Not on file    Attends religious service: Not on file    Active member of club or organization: Not on file    Attends meetings of clubs or organizations: Not on file    Relationship status: Not on file  Other Topics Concern  . Not on file  Social History Narrative  . Not on file   Outpatient Encounter Medications as of 12/30/2018  Medication Sig  . aspirin EC 81 MG tablet Take 81 mg by mouth daily.  . cephALEXin (KEFLEX) 500 MG capsule Take 1 capsule (500 mg total) by mouth 3 (three) times daily.  Marland Kitchen diltiazem (DILACOR  XR) 180 MG 24 hr capsule Take 180 mg by mouth daily.  Marland Kitchen FORA LANCETS MISC USE TO TEST BLOOD SUGAR 3 TIMES DAILY.  Marland Kitchen FORA V30A BLOOD GLUCOSE TEST test strip USE TO TEST BLOOD SUGAR 3 TIMES DAILY. BEFORE BREAKFAST, SUPPER AND BEDTIME.  . furosemide (LASIX) 20 MG tablet Take 20 mg by mouth.  . insulin aspart (NOVOLOG FLEXPEN) 100 UNIT/ML FlexPen Inject 4-10 Units into the skin 3 (three) times daily with meals.  . Insulin Glargine (LANTUS SOLOSTAR) 100 UNIT/ML Solostar Pen Inject 60 Units into the skin at bedtime.  Marland Kitchen latanoprost (XALATAN) 0.005 % ophthalmic solution 1 drop at bedtime.  Marland Kitchen linagliptin (TRADJENTA) 5 MG TABS tablet Take 5 mg by mouth daily.  . ramipril (ALTACE) 5 MG capsule Take 5 mg by mouth daily.  . risperiDONE (RISPERDAL) 0.5 MG tablet Take 0.5 mg by mouth at bedtime.  . sertraline (ZOLOFT) 100 MG tablet Take 100 mg by mouth daily.  . traZODone (DESYREL) 50 MG tablet Take 50 mg by mouth at bedtime.  . traZODone (DESYREL) 50 MG tablet Take 50 mg by mouth at bedtime.   No facility-administered encounter  medications on file as of 12/30/2018.    ALLERGIES: No Known Allergies VACCINATION STATUS:  There is no immunization history on file for this patient.  Diabetes  She presents for her follow-up diabetic visit. She has type 2 diabetes mellitus. Onset time: She is not sure when she was diagnosed with diabetes. Her disease course has been worsening. There are no hypoglycemic associated symptoms. Pertinent negatives for hypoglycemia include no confusion, headaches, pallor or seizures. Associated symptoms include fatigue, polydipsia and polyuria. Pertinent negatives for diabetes include no chest pain and no polyphagia. There are no hypoglycemic complications. Symptoms are worsening. There are no diabetic complications. Risk factors for coronary artery disease include diabetes mellitus, hypertension and dyslipidemia. Current diabetic treatment includes oral agent (monotherapy). Compliance  with diabetes treatment: She is in a group home due to failure to care for self. Her weight is increasing steadily. She is following a generally unhealthy diet. When asked about meal planning, she reported none. She has not had a previous visit with a dietitian. She rarely participates in exercise. Her home blood glucose trend is fluctuating minimally. Her overall blood glucose range is 180-200 mg/dl. (She returns with loss of control of glycemia with A1c of 12.1% increasing from 7.1%.) An ACE inhibitor/angiotensin II receptor blocker is being taken.    Review of Systems  Constitutional: Positive for fatigue. Negative for unexpected weight change.  HENT: Negative for trouble swallowing and voice change.   Eyes: Negative for visual disturbance.  Respiratory: Negative for cough, shortness of breath and wheezing.   Cardiovascular: Negative for chest pain, palpitations and leg swelling.  Gastrointestinal: Negative for diarrhea, nausea and vomiting.  Endocrine: Positive for polydipsia and polyuria. Negative for cold intolerance, heat intolerance and polyphagia.  Genitourinary: Positive for frequency. Negative for dysuria and flank pain.  Musculoskeletal: Negative for arthralgias and myalgias.  Skin: Negative for color change, pallor, rash and wound.  Neurological: Negative for seizures and headaches.  Psychiatric/Behavioral: Negative for confusion and suicidal ideas.    Objective:    BP 118/72   Pulse 97   Ht 5' 0.03" (1.525 m)   Wt 179 lb (81.2 kg)   BMI 34.93 kg/m   Wt Readings from Last 3 Encounters:  12/30/18 179 lb (81.2 kg)  12/21/18 171 lb 15.3 oz (78 kg)  08/05/18 172 lb (78 kg)    Physical Exam  HENT:  Head: Normocephalic and atraumatic.  Eyes: EOM are normal.  Neck: Normal range of motion. Neck supple. No tracheal deviation present. No thyromegaly present.  Cardiovascular: Normal rate and regular rhythm.  Pulmonary/Chest: Effort normal and breath sounds normal.  Abdominal:  Soft. Bowel sounds are normal. There is no abdominal tenderness. There is no guarding.  Musculoskeletal: Normal range of motion.        General: No edema.  Neurological: She is alert. No cranial nerve deficit. Coordination normal.  She is hard of hearing. She has reported mental retardation accompanied by her care manager.  Skin: Skin is warm and dry. No rash noted. No erythema. No pallor.  Psychiatric: She has a normal mood and affect.    December 23, 2018 labs: Hemoglobin A1c 12.1%  Lipid Panel     Component Value Date/Time   CHOL 220 (A) 12/23/2018   TRIG 136 12/23/2018   HDL 44 12/23/2018   LDLCALC 149 12/23/2018     Labs from July 30, 2017 showed A1c of 7.1%. Her A1c was 7.3% on 01/01/2017.   Assessment & Plan:   1. Uncontrolled type 2  diabetes mellitus with complication, without long-term current use of insulin (HCC)  -She has chronically uncontrolled type 2 diabetes of unknown duration.   -She returns with loss of control with A1c of 12.1%, increasing from 7.1%.  - She has no reported gross complications from diabetes however patient remains at a high risk for more acute and chronic complications of diabetes which include CAD, CVA, CKD, retinopathy, and neuropathy.  -  She does not understand the high risk she has, these are all discussed with her care manager from the group home.  - I have counseled the patient on diet management by adopting a carbohydrate restricted/protein rich diet. - Patient admits there is a room for improvement in her diet and drink choices. -  Suggestion is made for her to avoid simple carbohydrates  from her diet including Cakes, Sweet Desserts / Pastries, Ice Cream, Soda (diet and regular), Sweet Tea, Candies, Chips, Cookies, Store Bought Juices, Alcohol in Excess of  1-2 drinks a day, Artificial Sweeteners, and "Sugar-free" Products. This will help patient to have stable blood glucose profile and potentially avoid unintended weight  gain.  - I encouraged the patient and her aide to switch her diet to  unprocessed or minimally processed complex starch and increased protein intake (animal or plant source), fruits, and vegetables.  - Patient is advised to stick to a routine mealtimes to eat 3 meals  a day and avoid unnecessary snacks ( to snack only to correct hypoglycemia).  - The patient will be scheduled with Norm Salt, RDN, CDE for individualized DM education.  - I have approached patient with the following individualized plan to manage diabetes and patient agrees:   -She is accompanied by her aide.  Due to significant loss of control and glycemia, she will need intensive treatment with basal/bolus insulin in order for her to achieve control of diabetes to target.   -I have suggested to continue Lantus 60 units nightly, discussed and initiated NovoLog 4-10 units 3 times daily AC for pre-meal blood glucose readings above 90 mg/dL, associated with strict monitoring of blood glucose 4 times a day-daily before meals and at bedtime.   -  Continue Tradjenta 5 mg p.o. once daily with breakfast.  -Patient's care taker is encouraged to call clinic for blood glucose levels less than 70 or above 300 mg /dl.   - She is not suitable candidate for SGLt2 inhibitors nor incretin therapy. - Patient specific target  A1c;  LDL, HDL, Triglycerides, and  Waist Circumference were discussed in detail.  2) BP/HT : Her blood pressure is controlled to target.  She is advised to continue her current blood pressure medications including ramipril 5 mg p.o. daily with breakfast.    3) Lipids/HPL: Her recent labs show uncontrolled LDL at 149.she is advised to be consistent on atorvastatin 20 mg p.o. nightly.    4)  Weight/Diet: she has gained weight significantly since last visit.   exercise, and detailed carbohydrates information provided.  5) Chronic Care/Health Maintenance:  -Patient is on ACEI/ARB and Statin medications and encouraged to  continue to follow up with Ophthalmology, Podiatrist at least yearly or according to recommendations, and advised to   stay away from smoking. I have recommended yearly flu vaccine and pneumonia vaccination at least every 5 years;  60 minutes of walking 4 days a week is recommended for her , and  sleep for at least 7 hours a day. - Patient to bring meter and  blood glucose logs during her next  visit.   - I advised patient to maintain close follow up with Louie Bostonapper, David B., MD for primary care needs.  - Time spent with the patient: 25 min, of which >50% was spent in reviewing her blood glucose logs , discussing her hypoglycemia and hyperglycemia episodes, reviewing her current and  previous labs / studies and medications  doses and developing a plan to avoid hypoglycemia and hyperglycemia. Please refer to Patient Instructions for Blood Glucose Monitoring and Insulin/Medications Dosing Guide"  in media tab for additional information. Ashley Blanchard participated in the discussions, expressed understanding, and voiced agreement with the above plans.  All questions were answered to her satisfaction. she is encouraged to contact clinic should she have any questions or concerns prior to her return visit.   Follow up plan: - Return in about 10 days (around 01/09/2019) for Follow up with Meter and Logs Only - no Labs.  Marquis LunchGebre Ashley Shadoan, MD Phone: [phone]  Fax: [phone]   12/30/2018, 1:22 PM

## 2019-01-08 ENCOUNTER — Ambulatory Visit: Payer: Medicare Other | Admitting: "Endocrinology

## 2019-01-15 ENCOUNTER — Ambulatory Visit (INDEPENDENT_AMBULATORY_CARE_PROVIDER_SITE_OTHER): Payer: Medicare Other | Admitting: "Endocrinology

## 2019-01-15 ENCOUNTER — Ambulatory Visit: Payer: Medicare Other | Admitting: "Endocrinology

## 2019-01-15 ENCOUNTER — Encounter: Payer: Self-pay | Admitting: "Endocrinology

## 2019-01-15 VITALS — BP 120/77 | HR 91 | Ht 60.0 in | Wt 177.2 lb

## 2019-01-15 DIAGNOSIS — E782 Mixed hyperlipidemia: Secondary | ICD-10-CM | POA: Diagnosis not present

## 2019-01-15 DIAGNOSIS — IMO0002 Reserved for concepts with insufficient information to code with codable children: Secondary | ICD-10-CM

## 2019-01-15 DIAGNOSIS — I10 Essential (primary) hypertension: Secondary | ICD-10-CM

## 2019-01-15 DIAGNOSIS — E118 Type 2 diabetes mellitus with unspecified complications: Secondary | ICD-10-CM | POA: Diagnosis not present

## 2019-01-15 DIAGNOSIS — E1165 Type 2 diabetes mellitus with hyperglycemia: Secondary | ICD-10-CM | POA: Diagnosis not present

## 2019-01-15 MED ORDER — INSULIN GLARGINE 100 UNIT/ML SOLOSTAR PEN: 50.0000 [IU] | PEN_INJECTOR | Freq: Every day | SUBCUTANEOUS | 2 refills | Status: DC

## 2019-01-15 NOTE — Patient Instructions (Signed)

## 2019-01-15 NOTE — Progress Notes (Signed)
Endocrinology follow-up note   Subjective:    Patient ID: Ashley Blanchard, female    DOB: Jun 08, 1966.  She is being seen in follow-up for history of uncontrolled type 2 diabetes, hyperlipidemia, hypertension.   PMD:   Louie Boston., MD  Past Medical History:  Diagnosis Date  . Chronic anemia   . Diabetes mellitus, type II (HCC)   . Hearing loss   . Hyperlipidemia   . Hypertension   . Mild mental retardation    Past Surgical History:  Procedure Laterality Date  . CATARACT EXTRACTION    . RETINAL DETACHMENT SURGERY     Social History   Socioeconomic History  . Marital status: Single    Spouse name: Not on file  . Number of children: Not on file  . Years of education: Not on file  . Highest education level: Not on file  Occupational History  . Not on file  Social Needs  . Financial resource strain: Not on file  . Food insecurity:    Worry: Not on file    Inability: Not on file  . Transportation needs:    Medical: Not on file    Non-medical: Not on file  Tobacco Use  . Smoking status: Never Smoker  . Smokeless tobacco: Never Used  Substance and Sexual Activity  . Alcohol use: No  . Drug use: No  . Sexual activity: Not on file  Lifestyle  . Physical activity:    Days per week: Not on file    Minutes per session: Not on file  . Stress: Not on file  Relationships  . Social connections:    Talks on phone: Not on file    Gets together: Not on file    Attends religious service: Not on file    Active member of club or organization: Not on file    Attends meetings of clubs or organizations: Not on file    Relationship status: Not on file  Other Topics Concern  . Not on file  Social History Narrative  . Not on file   Outpatient Encounter Medications as of 01/15/2019  Medication Sig  . aspirin EC 81 MG tablet Take 81 mg by mouth daily.  . cephALEXin (KEFLEX) 500 MG capsule Take 1 capsule (500 mg total) by mouth 3 (three) times daily.  Marland Kitchen diltiazem (DILACOR XR)  180 MG 24 hr capsule Take 180 mg by mouth daily.  Marland Kitchen FORA LANCETS MISC USE TO TEST BLOOD SUGAR 3 TIMES DAILY.  Marland Kitchen FORA V30A BLOOD GLUCOSE TEST test strip USE TO TEST BLOOD SUGAR 3 TIMES DAILY. BEFORE BREAKFAST, SUPPER AND BEDTIME.  . furosemide (LASIX) 20 MG tablet Take 20 mg by mouth.  . insulin aspart (NOVOLOG FLEXPEN) 100 UNIT/ML FlexPen Inject 4-10 Units into the skin 3 (three) times daily with meals.  . Insulin Glargine (LANTUS SOLOSTAR) 100 UNIT/ML Solostar Pen Inject 50 Units into the skin at bedtime.  Marland Kitchen latanoprost (XALATAN) 0.005 % ophthalmic solution 1 drop at bedtime.  Marland Kitchen linagliptin (TRADJENTA) 5 MG TABS tablet Take 5 mg by mouth daily.  . ramipril (ALTACE) 5 MG capsule Take 5 mg by mouth daily.  . risperiDONE (RISPERDAL) 0.5 MG tablet Take 0.5 mg by mouth at bedtime.  . sertraline (ZOLOFT) 100 MG tablet Take 100 mg by mouth daily.  . traZODone (DESYREL) 50 MG tablet Take 50 mg by mouth at bedtime.  . traZODone (DESYREL) 50 MG tablet Take 50 mg by mouth at bedtime.  . [DISCONTINUED] Insulin Glargine (  LANTUS SOLOSTAR) 100 UNIT/ML Solostar Pen Inject 60 Units into the skin at bedtime.   No facility-administered encounter medications on file as of 01/15/2019.    ALLERGIES: No Known Allergies VACCINATION STATUS:  There is no immunization history on file for this patient.  Diabetes  She presents for her follow-up diabetic visit. She has type 2 diabetes mellitus. Onset time: She is not sure when she was diagnosed with diabetes. Her disease course has been improving. There are no hypoglycemic associated symptoms. Pertinent negatives for hypoglycemia include no confusion, headaches, pallor or seizures. Associated symptoms include polydipsia. Pertinent negatives for diabetes include no chest pain, no fatigue, no polyphagia and no polyuria. There are no hypoglycemic complications. Symptoms are improving. There are no diabetic complications. Risk factors for coronary artery disease include  diabetes mellitus, hypertension and dyslipidemia. Current diabetic treatment includes oral agent (monotherapy). Compliance with diabetes treatment: She is in a group home due to failure to care for self. Her weight is stable. She is following a generally unhealthy diet. When asked about meal planning, she reported none. She has not had a previous visit with a dietitian. She rarely participates in exercise. Her home blood glucose trend is fluctuating minimally. Her breakfast blood glucose range is generally 140-180 mg/dl. Her lunch blood glucose range is generally 140-180 mg/dl. Her dinner blood glucose range is generally 140-180 mg/dl. Her bedtime blood glucose range is generally 140-180 mg/dl. Her overall blood glucose range is 140-180 mg/dl. (She returns with significantly improved glycemic profile after she was initiated on multiple daily injections of insulin.  Her most recent A1c was 12.1% ) An ACE inhibitor/angiotensin II receptor blocker is being taken.    Review of Systems  Constitutional: Negative for fatigue and unexpected weight change.  HENT: Negative for trouble swallowing and voice change.   Eyes: Negative for visual disturbance.  Respiratory: Negative for cough, shortness of breath and wheezing.   Cardiovascular: Negative for chest pain, palpitations and leg swelling.  Gastrointestinal: Negative for diarrhea, nausea and vomiting.  Endocrine: Positive for polydipsia. Negative for cold intolerance, heat intolerance, polyphagia and polyuria.  Genitourinary: Positive for frequency. Negative for dysuria and flank pain.  Musculoskeletal: Negative for arthralgias and myalgias.  Skin: Negative for color change, pallor, rash and wound.  Neurological: Negative for seizures and headaches.  Psychiatric/Behavioral: Negative for confusion and suicidal ideas.    Objective:    BP 120/77   Pulse 91   Ht 5' (1.524 m)   Wt 177 lb 3.2 oz (80.4 kg)   BMI 34.61 kg/m   Wt Readings from Last 3  Encounters:  01/15/19 177 lb 3.2 oz (80.4 kg)  12/30/18 179 lb (81.2 kg)  12/21/18 171 lb 15.3 oz (78 kg)    Physical Exam  HENT:  Head: Normocephalic and atraumatic.  Eyes: EOM are normal.  Neck: Normal range of motion. Neck supple. No tracheal deviation present. No thyromegaly present.  Cardiovascular: Normal rate and regular rhythm.  Pulmonary/Chest: Effort normal and breath sounds normal.  Abdominal: Soft. Bowel sounds are normal. There is no abdominal tenderness. There is no guarding.  Musculoskeletal: Normal range of motion.        General: No edema.  Neurological: She is alert. No cranial nerve deficit. Coordination normal.  She is hard of hearing. She has reported mental retardation accompanied by her care manager.  Skin: Skin is warm and dry. No rash noted. No erythema. No pallor.  Psychiatric: She has a normal mood and affect.    December 23, 2018 labs: Hemoglobin A1c 12.1%  Lipid Panel     Component Value Date/Time   CHOL 220 (A) 12/23/2018   TRIG 136 12/23/2018   HDL 44 12/23/2018   LDLCALC 149 12/23/2018     Labs from July 30, 2017 showed A1c of 7.1%. Her A1c was 7.3% on 01/01/2017.   Assessment & Plan:   1. Uncontrolled type 2 diabetes mellitus with complication, without long-term current use of insulin (HCC)  -She has chronically uncontrolled type 2 diabetes of unknown duration.   -She returns with significantly improved glycemic profile after she was initiated on multiple daily injections of insulin.  Her most recent A1c was 12.1% increasing from 7.1%.    - She has no reported gross complications from diabetes however patient remains at a high risk for more acute and chronic complications of diabetes which include CAD, CVA, CKD, retinopathy, and neuropathy.  -  She does not understand the high risk she has, these are all discussed with her care manager from the group home.  - I have counseled the patient on diet management by adopting a carbohydrate  restricted/protein rich diet.  - Patient admits there is a room for improvement in her diet and drink choices. -  Suggestion is made for her to avoid simple carbohydrates  from her diet including Cakes, Sweet Desserts / Pastries, Ice Cream, Soda (diet and regular), Sweet Tea, Candies, Chips, Cookies, Store Bought Juices, Alcohol in Excess of  1-2 drinks a day, Artificial Sweeteners, and "Sugar-free" Products. This will help patient to have stable blood glucose profile and potentially avoid unintended weight gain.  - I encouraged the patient and her aide to switch her diet to  unprocessed or minimally processed complex starch and increased protein intake (animal or plant source), fruits, and vegetables.  - Patient is advised to stick to a routine mealtimes to eat 3 meals  a day and avoid unnecessary snacks ( to snack only to correct hypoglycemia).  - The patient will be scheduled with Norm Salt, RDN, CDE for individualized DM education.  - I have approached patient with the following individualized plan to manage diabetes and patient agrees:   -She is accompanied by her aide.  She will be allowed to skip lunchtime insulin and monitoring so she can benefit from the daytime school that she is attending.   -She will continue to need intensive treatment with basal/bolus insulin in order for her to maintain control of diabetes.   -I discussed and suggested to decrease Lantus to 50 units nightly,    discussed and continued NovoLog  4-10 units 3 times daily AC for pre-meal blood glucose readings above 90 mg/dL, associated with strict monitoring of blood glucose 4 times a day-daily before meals and at bedtime.   -  Continue Tradjenta 5 mg p.o. once daily with breakfast.  -Patient's care taker is encouraged to call clinic for blood glucose levels less than 70 or above 300 mg /dl.   - She is not suitable candidate for SGLT2 inhibitors nor incretin therapy. - Patient specific target  A1c;  LDL, HDL,  Triglycerides, and  Waist Circumference were discussed in detail.  2) BP/HT : Her blood pressure is controlled to target.  She is advised to continue her current blood pressure medications including ramipril 5 mg p.o. daily with breakfast.    3) Lipids/HPL: Her recent labs show uncontrolled LDL at 149.she is advised to be consistent on atorvastatin 20 mg p.o. nightly.    4)  Weight/Diet: she  has gained weight significantly since last visit.   exercise, and detailed carbohydrates information provided.  5) Chronic Care/Health Maintenance:  -Patient is on ACEI/ARB and Statin medications and encouraged to continue to follow up with Ophthalmology, Podiatrist at least yearly or according to recommendations, and advised to   stay away from smoking. I have recommended yearly flu vaccine and pneumonia vaccination at least every 5 years;  60 minutes of walking 4 days a week is recommended for her , and  sleep for at least 7 hours a day. - Patient to bring meter and  blood glucose logs during her next visit.   - I advised patient to maintain close follow up with Louie Boston., MD for primary care needs.  - Time spent with the patient: 15 min, of which >50% was spent in reviewing her blood glucose logs , discussing her hypoglycemia and hyperglycemia episodes, reviewing her current and  previous labs / studies and medications  doses and developing a plan to avoid hypoglycemia and hyperglycemia. Please refer to Patient Instructions for Blood Glucose Monitoring and Insulin/Medications Dosing Guide"  in media tab for additional information. Madhavi Chalfin participated in the discussions, expressed understanding, and voiced agreement with the above plans.  All questions were answered to her satisfaction. she is encouraged to contact clinic should she have any questions or concerns prior to her return visit.    Follow up plan: - Return in about 3 months (around 04/17/2019) for Meter, and Logs.  Marquis Lunch,  MD Phone: [phone]  Fax: [phone]   01/15/2019, 5:36 PM

## 2019-01-15 NOTE — Progress Notes (Signed)
LeighAnn Jylan Loeza, CMA  

## 2019-03-05 ENCOUNTER — Other Ambulatory Visit: Payer: Self-pay | Admitting: "Endocrinology

## 2019-03-19 ENCOUNTER — Other Ambulatory Visit: Payer: Self-pay | Admitting: "Endocrinology

## 2019-04-23 ENCOUNTER — Ambulatory Visit (INDEPENDENT_AMBULATORY_CARE_PROVIDER_SITE_OTHER): Payer: Medicare Other | Admitting: "Endocrinology

## 2019-04-23 ENCOUNTER — Telehealth: Payer: Self-pay | Admitting: "Endocrinology

## 2019-04-23 ENCOUNTER — Encounter (INDEPENDENT_AMBULATORY_CARE_PROVIDER_SITE_OTHER): Payer: Self-pay

## 2019-04-23 ENCOUNTER — Other Ambulatory Visit: Payer: Self-pay

## 2019-04-23 ENCOUNTER — Encounter: Payer: Self-pay | Admitting: "Endocrinology

## 2019-04-23 VITALS — BP 127/80 | HR 102 | Ht 60.0 in | Wt 182.0 lb

## 2019-04-23 DIAGNOSIS — E782 Mixed hyperlipidemia: Secondary | ICD-10-CM | POA: Diagnosis not present

## 2019-04-23 DIAGNOSIS — E1165 Type 2 diabetes mellitus with hyperglycemia: Secondary | ICD-10-CM

## 2019-04-23 DIAGNOSIS — E118 Type 2 diabetes mellitus with unspecified complications: Secondary | ICD-10-CM | POA: Diagnosis not present

## 2019-04-23 DIAGNOSIS — IMO0002 Reserved for concepts with insufficient information to code with codable children: Secondary | ICD-10-CM

## 2019-04-23 DIAGNOSIS — I10 Essential (primary) hypertension: Secondary | ICD-10-CM | POA: Diagnosis not present

## 2019-04-23 MED ORDER — NOVOLOG FLEXPEN 100 UNIT/ML ~~LOC~~ SOPN: 10.0000 [IU] | PEN_INJECTOR | Freq: Three times a day (TID) | SUBCUTANEOUS | 11 refills | Status: DC

## 2019-04-23 MED ORDER — GLIPIZIDE ER 5 MG PO TB24: 5.0000 mg | ORAL_TABLET | Freq: Every day | ORAL | 3 refills | Status: DC

## 2019-04-23 NOTE — Telephone Encounter (Signed)
Ashley Blanchard with [facility] wants to know which medications were d/c

## 2019-04-23 NOTE — Telephone Encounter (Signed)
Gave insulin instruction to [facility]

## 2019-04-23 NOTE — Patient Instructions (Signed)

## 2019-04-23 NOTE — Progress Notes (Signed)
Endocrinology follow-up note   Subjective:    Patient ID: Ashley Blanchard, female    DOB: 1966/04/24.  She is being seen in follow-up for history of uncontrolled type 2 diabetes, hyperlipidemia, hypertension.   PMD:   Louie Bostonapper, David B., MD  Past Medical History:  Diagnosis Date  . Chronic anemia   . Diabetes mellitus, type II (HCC)   . Hearing loss   . Hyperlipidemia   . Hypertension   . Mild mental retardation    Past Surgical History:  Procedure Laterality Date  . CATARACT EXTRACTION    . RETINAL DETACHMENT SURGERY     Social History   Socioeconomic History  . Marital status: Single    Spouse name: Not on file  . Number of children: Not on file  . Years of education: Not on file  . Highest education level: Not on file  Occupational History  . Not on file  Social Needs  . Financial resource strain: Not on file  . Food insecurity    Worry: Not on file    Inability: Not on file  . Transportation needs    Medical: Not on file    Non-medical: Not on file  Tobacco Use  . Smoking status: Never Smoker  . Smokeless tobacco: Never Used  Substance and Sexual Activity  . Alcohol use: No  . Drug use: No  . Sexual activity: Not on file  Lifestyle  . Physical activity    Days per week: Not on file    Minutes per session: Not on file  . Stress: Not on file  Relationships  . Social Musicianconnections    Talks on phone: Not on file    Gets together: Not on file    Attends religious service: Not on file    Active member of club or organization: Not on file    Attends meetings of clubs or organizations: Not on file    Relationship status: Not on file  Other Topics Concern  . Not on file  Social History Narrative  . Not on file   Outpatient Encounter Medications as of 04/23/2019  Medication Sig  . atorvastatin (LIPITOR) 20 MG tablet Take 20 mg by mouth daily.  . [DISCONTINUED] glipiZIDE (GLUCOTROL) 10 MG tablet Take 10 mg by mouth 2 (two) times daily before a meal.  .  aspirin EC 81 MG tablet Take 81 mg by mouth daily.  . cephALEXin (KEFLEX) 500 MG capsule Take 1 capsule (500 mg total) by mouth 3 (three) times daily.  Marland Kitchen. diltiazem (DILACOR XR) 180 MG 24 hr capsule Take 180 mg by mouth daily.  Marland Kitchen. FORA LANCETS MISC USE TO TEST BLOOD SUGAR 3 TIMES DAILY.  Marland Kitchen. FORA V30A BLOOD GLUCOSE TEST test strip USE TO TEST BLOOD SUGAR 4 TIMES DAILY. BEFORE BREAKFAST, LUNCH, SUPPER AND AT BEDTIME.  . furosemide (LASIX) 20 MG tablet Take 20 mg by mouth.  Marland Kitchen. glipiZIDE (GLUCOTROL XL) 5 MG 24 hr tablet Take 1 tablet (5 mg total) by mouth daily with breakfast.  . insulin aspart (NOVOLOG FLEXPEN) 100 UNIT/ML FlexPen Inject 10-16 Units into the skin 3 (three) times daily with meals.  Marland Kitchen. LANTUS SOLOSTAR 100 UNIT/ML Solostar Pen INJECT 50 UNITS SUBCUTANEOUSLY AT BEDTIME  . linagliptin (TRADJENTA) 5 MG TABS tablet Take 5 mg by mouth daily.  . ramipril (ALTACE) 5 MG capsule Take 5 mg by mouth daily.  . risperiDONE (RISPERDAL) 0.5 MG tablet Take 0.5 mg by mouth at bedtime.  . sertraline (ZOLOFT) 100 MG  tablet Take 100 mg by mouth daily.  . traZODone (DESYREL) 50 MG tablet Take 50 mg by mouth at bedtime.  . [DISCONTINUED] insulin aspart (NOVOLOG FLEXPEN) 100 UNIT/ML FlexPen Inject 4-10 Units into the skin 3 (three) times daily with meals. (Patient taking differently: Inject 5-10 Units into the skin 3 (three) times daily with meals. )  . [DISCONTINUED] latanoprost (XALATAN) 0.005 % ophthalmic solution 1 drop at bedtime.  . [DISCONTINUED] traZODone (DESYREL) 50 MG tablet Take 50 mg by mouth at bedtime.   No facility-administered encounter medications on file as of 04/23/2019.    ALLERGIES: No Known Allergies VACCINATION STATUS:  There is no immunization history on file for this patient.  Diabetes She presents for her follow-up diabetic visit. She has type 2 diabetes mellitus. Onset time: She is not sure when she was diagnosed with diabetes. Her disease course has been worsening. There are no  hypoglycemic associated symptoms. Pertinent negatives for hypoglycemia include no confusion, headaches, pallor or seizures. Associated symptoms include polydipsia. Pertinent negatives for diabetes include no chest pain, no fatigue, no polyphagia and no polyuria. There are no hypoglycemic complications. Symptoms are worsening. There are no diabetic complications. Risk factors for coronary artery disease include diabetes mellitus, hypertension and dyslipidemia. Current diabetic treatment includes oral agent (monotherapy). Compliance with diabetes treatment: She is in a group home due to failure to care for self. Her weight is increasing steadily. She is following a generally unhealthy diet. When asked about meal planning, she reported none. She has not had a previous visit with a dietitian. She rarely participates in exercise. Her home blood glucose trend is fluctuating minimally. Her breakfast blood glucose range is generally 140-180 mg/dl. Her lunch blood glucose range is generally >200 mg/dl. Her dinner blood glucose range is generally >200 mg/dl. Her bedtime blood glucose range is generally >200 mg/dl. Her overall blood glucose range is >200 mg/dl. An ACE inhibitor/angiotensin II receptor blocker is being taken.    Review of Systems  Constitutional: Negative for fatigue and unexpected weight change.  HENT: Negative for trouble swallowing and voice change.   Eyes: Negative for visual disturbance.  Respiratory: Negative for cough, shortness of breath and wheezing.   Cardiovascular: Negative for chest pain, palpitations and leg swelling.  Gastrointestinal: Negative for diarrhea, nausea and vomiting.  Endocrine: Positive for polydipsia. Negative for cold intolerance, heat intolerance, polyphagia and polyuria.  Genitourinary: Positive for frequency. Negative for dysuria and flank pain.  Musculoskeletal: Negative for arthralgias and myalgias.  Skin: Negative for color change, pallor, rash and wound.   Neurological: Negative for seizures and headaches.  Psychiatric/Behavioral: Negative for confusion and suicidal ideas.    Objective:    BP 127/80   Pulse (!) 102   Ht 5' (1.524 m)   Wt 182 lb (82.6 kg)   BMI 35.54 kg/m   Wt Readings from Last 3 Encounters:  04/23/19 182 lb (82.6 kg)  01/15/19 177 lb 3.2 oz (80.4 kg)  12/30/18 179 lb (81.2 kg)    Physical Exam  HENT:  Head: Normocephalic and atraumatic.  Eyes: EOM are normal.  Neck: Normal range of motion. Neck supple. No tracheal deviation present. No thyromegaly present.  Cardiovascular: Normal rate and regular rhythm.  Pulmonary/Chest: Effort normal.  Abdominal: There is no abdominal tenderness. There is no guarding.  Musculoskeletal: Normal range of motion.        General: No edema.  Neurological: She is alert. No cranial nerve deficit. Coordination normal.  She is hard of hearing. She has  reported mental retardation accompanied by her care manager.  Skin: Skin is warm and dry. No rash noted. No erythema. No pallor.  Psychiatric: She has a normal mood and affect.    December 23, 2018 labs: Hemoglobin A1c 12.1%  Lipid Panel     Component Value Date/Time   CHOL 220 (A) 12/23/2018   TRIG 136 12/23/2018   HDL 44 12/23/2018   LDLCALC 149 12/23/2018     Labs from July 30, 2017 showed A1c of 7.1%. Her A1c was 7.3% on 01/01/2017.   Assessment & Plan:   1. Uncontrolled type 2 diabetes mellitus with complication, without long-term current use of insulin (HCC)  -She has chronically uncontrolled type 2 diabetes of unknown duration.   -She is accompanied by her caretaker and returns with significant loss of glycemic profile postprandially.  She has not done her previsit labs.  Her last visit A1c was 12.1%.    - She has no reported gross complications from diabetes however patient remains at a high risk for more acute and chronic complications of diabetes which include CAD, CVA, CKD, retinopathy, and neuropathy.   -  She does not understand the high risk she has, these are all discussed with her care manager from the group home.  - I have counseled the patient on diet management by adopting a carbohydrate restricted/protein rich diet. - she  admits there is a room for improvement in her diet and drink choices. -  Suggestion is made for her to avoid simple carbohydrates  from her diet including Cakes, Sweet Desserts / Pastries, Ice Cream, Soda (diet and regular), Sweet Tea, Candies, Chips, Cookies, Sweet Pastries,  Store Bought Juices, Alcohol in Excess of  1-2 drinks a day, Artificial Sweeteners, Coffee Creamer, and "Sugar-free" Products. This will help patient to have stable blood glucose profile and potentially avoid unintended weight gain.  - I encouraged the patient and her aide to switch her diet to  unprocessed or minimally processed complex starch and increased protein intake (animal or plant source), fruits, and vegetables.  - Patient is advised to stick to a routine mealtimes to eat 3 meals  a day and avoid unnecessary snacks ( to snack only to correct hypoglycemia).  - The patient will be scheduled with Norm SaltPenny Crumpton, RDN, CDE for individualized DM education.  - I have approached patient with the following individualized plan to manage diabetes and patient agrees:   -She is accompanied by her aide.    -She will continue to need intensive treatment with basal/bolus insulin in order for her to maintain control of diabetes.   -I discussed and suggested to continue Lantus at 50 units nightly, discussed and increase NovoLog to 10-16  units 3 times daily AC for pre-meal blood glucose readings above 90 mg/dL, associated with strict monitoring of blood glucose 4 times a day-daily before meals and at bedtime.   -  Continue Tradjenta 5 mg p.o. once daily with breakfast.  -Patient's care taker is encouraged to call clinic for blood glucose levels less than 70 or above 300 mg /dl. -I lowered her glipizide  to 5 mg XL p.o. daily at breakfast.   - She is not suitable candidate for SGLT2 inhibitors nor incretin therapy. - Patient specific target  A1c;  LDL, HDL, Triglycerides, and  Waist Circumference were discussed in detail.  2) BP/HT : Her blood pressure is controlled to target.  She is advised to continue her current blood pressure medications including ramipril 5 mg p.o. daily with  breakfast.    3) Lipids/HPL: Her recent labs show uncontrolled LDL at 149.she is advised to be consistent on atorvastatin 20 mg p.o. nightly.    4)  Weight/Diet: she has gained weight significantly since last visit.   exercise, and detailed carbohydrates information provided.  5) Chronic Care/Health Maintenance:  -Patient is on ACEI/ARB and Statin medications and encouraged to continue to follow up with Ophthalmology, Podiatrist at least yearly or according to recommendations, and advised to   stay away from smoking. I have recommended yearly flu vaccine and pneumonia vaccination at least every 5 years;  60 minutes of walking 4 days a week is recommended for her , and  sleep for at least 7 hours a day. - Patient to bring meter and  blood glucose logs during her next visit.   - I advised patient to maintain close follow up with Deloria Lair., MD for primary care needs.  - Time spent with the patient: 25 min, of which >50% was spent in reviewing her blood glucose logs , discussing her hypoglycemia and hyperglycemia episodes, reviewing her current and  previous labs / studies and medications  doses and developing a plan to avoid hypoglycemia and hyperglycemia. Please refer to Patient Instructions for Blood Glucose Monitoring and Insulin/Medications Dosing Guide"  in media tab for additional information. Please  also refer to " Patient Self Inventory" in the Media  tab for reviewed elements of pertinent patient history.  Kaidence Mone participated in the discussions, expressed understanding, and voiced agreement with  the above plans.  All questions were answered to her satisfaction. she is encouraged to contact clinic should she have any questions or concerns prior to her return visit.   Follow up plan: - Return in about 10 days (around 05/03/2019) for Follow up with Pre-visit Labs, Meter, and Logs, Labs Today- Non-Fasting Ok.  Glade Lloyd, MD Phone: [phone]  Fax: [phone]   04/23/2019, 1:01 PM

## 2019-04-28 LAB — VITAMIN D 25 HYDROXY (VIT D DEFICIENCY, FRACTURES): Vit D, 25-Hydroxy: 13

## 2019-04-28 LAB — BASIC METABOLIC PANEL
BUN: 17 (ref 4–21)
Creatinine: 1.2 — AB (ref 0.5–1.1)

## 2019-04-28 LAB — TSH: TSH: 2.14 (ref 0.41–5.90)

## 2019-05-07 ENCOUNTER — Encounter: Payer: Self-pay | Admitting: "Endocrinology

## 2019-05-07 ENCOUNTER — Other Ambulatory Visit: Payer: Self-pay

## 2019-05-07 ENCOUNTER — Ambulatory Visit (INDEPENDENT_AMBULATORY_CARE_PROVIDER_SITE_OTHER): Payer: Medicare Other | Admitting: "Endocrinology

## 2019-05-07 VITALS — BP 112/73 | HR 101 | Ht 60.0 in | Wt 180.0 lb

## 2019-05-07 DIAGNOSIS — I10 Essential (primary) hypertension: Secondary | ICD-10-CM | POA: Diagnosis not present

## 2019-05-07 DIAGNOSIS — E1165 Type 2 diabetes mellitus with hyperglycemia: Secondary | ICD-10-CM | POA: Diagnosis not present

## 2019-05-07 DIAGNOSIS — E118 Type 2 diabetes mellitus with unspecified complications: Secondary | ICD-10-CM

## 2019-05-07 DIAGNOSIS — IMO0002 Reserved for concepts with insufficient information to code with codable children: Secondary | ICD-10-CM

## 2019-05-07 DIAGNOSIS — E782 Mixed hyperlipidemia: Secondary | ICD-10-CM

## 2019-05-07 NOTE — Patient Instructions (Signed)

## 2019-05-07 NOTE — Progress Notes (Signed)
Endocrinology follow-up note   Subjective:    Patient ID: Ashley Blanchard, female    DOB: 11/28/1965.  She is being seen in follow-up for history of uncontrolled type 2 diabetes, hyperlipidemia, hypertension.   PMD:   Louie Bostonapper, David B., MD  Past Medical History:  Diagnosis Date  . Chronic anemia   . Diabetes mellitus, type II (HCC)   . Hearing loss   . Hyperlipidemia   . Hypertension   . Mild mental retardation    Past Surgical History:  Procedure Laterality Date  . CATARACT EXTRACTION    . RETINAL DETACHMENT SURGERY     Social History   Socioeconomic History  . Marital status: Single    Spouse name: Not on file  . Number of children: Not on file  . Years of education: Not on file  . Highest education level: Not on file  Occupational History  . Not on file  Social Needs  . Financial resource strain: Not on file  . Food insecurity    Worry: Not on file    Inability: Not on file  . Transportation needs    Medical: Not on file    Non-medical: Not on file  Tobacco Use  . Smoking status: Never Smoker  . Smokeless tobacco: Never Used  Substance and Sexual Activity  . Alcohol use: No  . Drug use: No  . Sexual activity: Not on file  Lifestyle  . Physical activity    Days per week: Not on file    Minutes per session: Not on file  . Stress: Not on file  Relationships  . Social Musicianconnections    Talks on phone: Not on file    Gets together: Not on file    Attends religious service: Not on file    Active member of club or organization: Not on file    Attends meetings of clubs or organizations: Not on file    Relationship status: Not on file  Other Topics Concern  . Not on file  Social History Narrative  . Not on file   Outpatient Encounter Medications as of 05/07/2019  Medication Sig  . aspirin EC 81 MG tablet Take 81 mg by mouth daily.  Marland Kitchen. atorvastatin (LIPITOR) 20 MG tablet Take 20 mg by mouth daily.  . cephALEXin (KEFLEX) 500 MG capsule Take 1 capsule (500 mg  total) by mouth 3 (three) times daily.  Marland Kitchen. diltiazem (DILACOR XR) 180 MG 24 hr capsule Take 180 mg by mouth daily.  Marland Kitchen. FORA LANCETS MISC USE TO TEST BLOOD SUGAR 3 TIMES DAILY.  Marland Kitchen. FORA V30A BLOOD GLUCOSE TEST test strip USE TO TEST BLOOD SUGAR 4 TIMES DAILY. BEFORE BREAKFAST, LUNCH, SUPPER AND AT BEDTIME.  . furosemide (LASIX) 20 MG tablet Take 20 mg by mouth.  Marland Kitchen. glipiZIDE (GLUCOTROL XL) 5 MG 24 hr tablet Take 1 tablet (5 mg total) by mouth daily with breakfast.  . insulin aspart (NOVOLOG FLEXPEN) 100 UNIT/ML FlexPen Inject 10-16 Units into the skin 3 (three) times daily with meals.  Marland Kitchen. LANTUS SOLOSTAR 100 UNIT/ML Solostar Pen INJECT 50 UNITS SUBCUTANEOUSLY AT BEDTIME  . linagliptin (TRADJENTA) 5 MG TABS tablet Take 5 mg by mouth daily.  . ramipril (ALTACE) 5 MG capsule Take 5 mg by mouth daily.  . risperiDONE (RISPERDAL) 0.5 MG tablet Take 0.5 mg by mouth at bedtime.  . sertraline (ZOLOFT) 100 MG tablet Take 100 mg by mouth daily.  . traZODone (DESYREL) 50 MG tablet Take 50 mg by mouth at  bedtime.   No facility-administered encounter medications on file as of 05/07/2019.    ALLERGIES: No Known Allergies VACCINATION STATUS:  There is no immunization history on file for this patient.  Diabetes She presents for her follow-up diabetic visit. She has type 2 diabetes mellitus. Onset time: She is not sure when she was diagnosed with diabetes. Her disease course has been improving. There are no hypoglycemic associated symptoms. Pertinent negatives for hypoglycemia include no confusion, headaches, pallor or seizures. Associated symptoms include polydipsia and polyuria. Pertinent negatives for diabetes include no chest pain, no fatigue and no polyphagia. There are no hypoglycemic complications. Symptoms are improving. There are no diabetic complications. Risk factors for coronary artery disease include diabetes mellitus, hypertension and dyslipidemia. Current diabetic treatment includes oral agent  (monotherapy). Compliance with diabetes treatment: She is in a group home due to failure to care for self. Her weight is increasing steadily. She is following a generally unhealthy diet. When asked about meal planning, she reported none. She has not had a previous visit with a dietitian. She rarely participates in exercise. Her home blood glucose trend is fluctuating minimally. Her breakfast blood glucose range is generally 140-180 mg/dl. Her lunch blood glucose range is generally >200 mg/dl. Her dinner blood glucose range is generally >200 mg/dl. Her bedtime blood glucose range is generally >200 mg/dl. Her overall blood glucose range is >200 mg/dl. An ACE inhibitor/angiotensin II receptor blocker is being taken.  Hypertension This is a chronic problem. The current episode started more than 1 year ago. The problem is controlled. Pertinent negatives include no chest pain, headaches, palpitations or shortness of breath. Risk factors for coronary artery disease include dyslipidemia, diabetes mellitus, obesity and sedentary lifestyle. Past treatments include ACE inhibitors.  Hyperlipidemia This is a chronic problem. The current episode started more than 1 year ago. Exacerbating diseases include diabetes and obesity. Pertinent negatives include no chest pain, myalgias or shortness of breath. Risk factors for coronary artery disease include diabetes mellitus, dyslipidemia, hypertension and obesity.    Review of Systems  Constitutional: Negative for fatigue and unexpected weight change.  HENT: Negative for trouble swallowing and voice change.   Eyes: Negative for visual disturbance.  Respiratory: Negative for cough, shortness of breath and wheezing.   Cardiovascular: Negative for chest pain, palpitations and leg swelling.  Gastrointestinal: Negative for diarrhea, nausea and vomiting.  Endocrine: Positive for polydipsia and polyuria. Negative for cold intolerance, heat intolerance and polyphagia.   Genitourinary: Positive for frequency. Negative for dysuria and flank pain.  Musculoskeletal: Negative for arthralgias and myalgias.  Skin: Negative for color change, pallor, rash and wound.  Neurological: Negative for seizures and headaches.  Psychiatric/Behavioral: Negative for confusion and suicidal ideas.    Objective:    BP 112/73   Pulse (!) 101   Ht 5' (1.524 m)   Wt 180 lb (81.6 kg)   BMI 35.15 kg/m   Wt Readings from Last 3 Encounters:  05/07/19 180 lb (81.6 kg)  04/23/19 182 lb (82.6 kg)  01/15/19 177 lb 3.2 oz (80.4 kg)    Physical Exam  HENT:  Head: Normocephalic and atraumatic.  Eyes: EOM are normal.  Neck: Normal range of motion. Neck supple. No tracheal deviation present. No thyromegaly present.  Cardiovascular: Normal rate and regular rhythm.  Pulmonary/Chest: Effort normal.  Abdominal: There is no abdominal tenderness. There is no guarding.  Musculoskeletal: Normal range of motion.        General: No edema.  Neurological: She is alert. No cranial  nerve deficit. Coordination normal.  She is hard of hearing. She has reported mental retardation accompanied by her care manager.  Skin: Skin is warm and dry. No rash noted. No erythema. No pallor.  Psychiatric: She has a normal mood and affect.    December 23, 2018 labs: Hemoglobin A1c 12.1%  Lipid Panel     Component Value Date/Time   CHOL 220 (A) 12/23/2018   TRIG 136 12/23/2018   HDL 44 12/23/2018   LDLCALC 149 12/23/2018   Recent Results (from the past 2160 hour(s))  VITAMIN D 25 Hydroxy (Vit-D Deficiency, Fractures)     Status: None   Collection Time: 04/28/19 12:00 AM  Result Value Ref Range   Vit D, 25-Hydroxy 13   Basic metabolic panel     Status: Abnormal   Collection Time: 04/28/19 12:00 AM  Result Value Ref Range   BUN 17 4 - 21   Creatinine 1.2 (A) 0.5 - 1.1  TSH     Status: None   Collection Time: 04/28/19 12:00 AM  Result Value Ref Range   TSH 2.14 0.41 - 5.90    Comment: ft4 0.86      Labs from July 30, 2017 showed A1c of 7.1%. Her A1c was 7.3% on 01/01/2017.   Assessment & Plan:   1. Uncontrolled type 2 diabetes mellitus with complication, without long-term current use of insulin (HCC)  -She has chronically uncontrolled type 2 diabetes of unknown duration.   -She is accompanied by her caretaker and returns with still significantly above target glycemic profile , however improving significantly from last visit.  Her most recent A1c was 12.1%.    - She has no reported gross complications from diabetes however patient remains at a high risk for more acute and chronic complications of diabetes which include CAD, CVA, CKD, retinopathy, and neuropathy.  -  She does not understand the high risk she has, these are all discussed with her care manager from the group home.  - I have counseled the patient on diet management by adopting a carbohydrate restricted/protein rich diet.  - she  admits there is a room for improvement in her diet and drink choices. -  Suggestion is made for her to avoid simple carbohydrates  from her diet including Cakes, Sweet Desserts / Pastries, Ice Cream, Soda (diet and regular), Sweet Tea, Candies, Chips, Cookies, Sweet Pastries,  Store Bought Juices, Alcohol in Excess of  1-2 drinks a day, Artificial Sweeteners, Coffee Creamer, and "Sugar-free" Products. This will help patient to have stable blood glucose profile and potentially avoid unintended weight gain.   - I encouraged the patient and her aide to switch her diet to  unprocessed or minimally processed complex starch and increased protein intake (animal or plant source), fruits, and vegetables.  - Patient is advised to stick to a routine mealtimes to eat 3 meals  a day and avoid unnecessary snacks ( to snack only to correct hypoglycemia).  - The patient will be scheduled with Norm SaltPenny Crumpton, RDN, CDE for individualized DM education.  - I have approached patient with the following  individualized plan to manage diabetes and patient agrees:   -She is accompanied by her aide.    -Reportedly, she did not change her dietary habits consuming a large quantities of sweetened beverages. -She will continue to need intensive treatment with basal/bolus insulin in order for her to maintain control of diabetes.   -I discussed and continued Lantus  at 50 units nightly, continue NovoLog to 10-16  units 3 times daily AC for pre-meal blood glucose readings above 90 mg/dL, associated with strict monitoring of blood glucose 4 times a day-daily before meals and at bedtime.   -Continue Tradjenta 5 mg p.o. daily once breakfast along with glipizide 5 mg XL p.o. daily at breakfast.    -Patient's care taker is encouraged to call clinic for blood glucose levels less than 70 or above 300 mg /dl.   - She is not suitable candidate for SGLT2 inhibitors nor incretin therapy. - Patient specific target  A1c;  LDL, HDL, Triglycerides, and  Waist Circumference were discussed in detail.  2) BP/HT : Her blood pressure is controlled to target.  She is advised to continue her current blood pressure medications including ramipril 5 mg p.o. daily with breakfast.    3) Lipids/HPL: Her recent labs show uncontrolled LDL at 149.she is advised to continue atorvastatin 20 mg p.o. nightly.    4)  Weight/Diet: she has gained weight significantly since last visit.   exercise, and detailed carbohydrates information provided.  5) Chronic Care/Health Maintenance:  -Patient is on ACEI/ARB and Statin medications and encouraged to continue to follow up with Ophthalmology, Podiatrist at least yearly or according to recommendations, and advised to   stay away from smoking. I have recommended yearly flu vaccine and pneumonia vaccination at least every 5 years;  60 minutes of walking 4 days a week is recommended for her , and  sleep for at least 7 hours a day. - Patient to bring meter and  blood glucose logs during her next  visit.   - I advised patient to maintain close follow up with Deloria Lair., MD for primary care needs.  - Time spent with the patient: 25 min, of which >50% was spent in reviewing her blood glucose logs , discussing her hypoglycemia and hyperglycemia episodes, reviewing her current and  previous labs / studies and medications  doses and developing a plan to avoid hypoglycemia and hyperglycemia. Please refer to Patient Instructions for Blood Glucose Monitoring and Insulin/Medications Dosing Guide"  in media tab for additional information. Please  also refer to " Patient Self Inventory" in the Media  tab for reviewed elements of pertinent patient history.  Tianna Wirsing participated in the discussions, expressed understanding, and voiced agreement with the above plans.  All questions were answered to her satisfaction. she is encouraged to contact clinic should she have any questions or concerns prior to her return visit.    Follow up plan: - Return in about 3 months (around 08/07/2019) for Follow up with Pre-visit Labs, Meter, and Logs.  Glade Lloyd, MD Phone: [phone]  Fax: [phone]   05/07/2019, 8:50 AM

## 2019-06-15 ENCOUNTER — Other Ambulatory Visit: Payer: Self-pay | Admitting: "Endocrinology

## 2019-07-27 ENCOUNTER — Other Ambulatory Visit: Payer: Self-pay | Admitting: "Endocrinology

## 2019-08-18 ENCOUNTER — Ambulatory Visit: Payer: Medicare Other | Admitting: "Endocrinology

## 2019-09-22 ENCOUNTER — Encounter: Payer: Self-pay | Admitting: "Endocrinology

## 2019-09-22 ENCOUNTER — Ambulatory Visit: Payer: Medicare Other | Admitting: "Endocrinology

## 2019-12-15 ENCOUNTER — Other Ambulatory Visit (HOSPITAL_COMMUNITY): Payer: Self-pay | Admitting: Internal Medicine

## 2019-12-15 DIAGNOSIS — Z1231 Encounter for screening mammogram for malignant neoplasm of breast: Secondary | ICD-10-CM

## 2019-12-30 ENCOUNTER — Other Ambulatory Visit: Payer: Self-pay

## 2019-12-30 ENCOUNTER — Ambulatory Visit (HOSPITAL_COMMUNITY)
Admission: RE | Admit: 2019-12-30 | Discharge: 2019-12-30 | Disposition: A | Discharge status: Home / Self Care | Payer: Medicare Other | Source: Ambulatory Visit | Attending: Internal Medicine | Admitting: Internal Medicine

## 2019-12-30 DIAGNOSIS — Z1231 Encounter for screening mammogram for malignant neoplasm of breast: Secondary | ICD-10-CM | POA: Insufficient documentation

## 2020-01-18 ENCOUNTER — Telehealth: Payer: Self-pay | Admitting: Adult Health

## 2020-01-18 NOTE — Telephone Encounter (Signed)

## 2020-01-19 ENCOUNTER — Other Ambulatory Visit: Payer: Self-pay

## 2020-01-19 ENCOUNTER — Ambulatory Visit (INDEPENDENT_AMBULATORY_CARE_PROVIDER_SITE_OTHER): Payer: Medicare Other | Admitting: Adult Health

## 2020-01-19 ENCOUNTER — Encounter: Payer: Self-pay | Admitting: Adult Health

## 2020-01-19 VITALS — BP 146/82 | HR 113 | Ht 60.0 in | Wt 206.0 lb

## 2020-01-19 DIAGNOSIS — Z01419 Encounter for gynecological examination (general) (routine) without abnormal findings: Secondary | ICD-10-CM

## 2020-01-19 DIAGNOSIS — E119 Type 2 diabetes mellitus without complications: Secondary | ICD-10-CM

## 2020-01-19 DIAGNOSIS — I10 Essential (primary) hypertension: Secondary | ICD-10-CM

## 2020-01-19 NOTE — Progress Notes (Signed)
Patient ID: Ashley Blanchard, female   DOB: July 06, 1966, 54 y.o.   MRN: [id number]

## 2020-01-19 NOTE — Progress Notes (Signed)
Patient ID: Ashley Blanchard, female   DOB: July 18, 1966, 54 y.o.   MRN: [id number] History of Present Illness: Ashley Blanchard is a 54 year old black female, single, G0P0, PM, new pt in for pap and pelvic.She resides at [facility] term. She has DM,HTN and MR PCP is Dr Felecia Shelling.    Current Medications, Allergies, Past Medical History, Past Surgical History, Family History and Social History were reviewed in [employer] record.     Review of Systems: She is mostly non verbal and but denies with head shake no pain, problems with urination or bowels. Has never had sex   Physical Exam:BP (!) 146/82 (BP Location: Right Arm, Patient Position: Sitting, Cuff Size: Normal)   Pulse (!) 113   Ht 5' (1.524 m)   Wt 206 lb (93.4 kg)   LMP  (LMP Unknown)   BMI 40.23 kg/m  General:  Well developed, well nourished, no acute distress Skin:  Warm and dry Neck:  Midline trachea, normal thyroid, good ROM, no lymphadenopathy Lungs; Clear to auscultation bilaterally Cardiovascular: Regular rate and rhythm Pelvic:  External genitalia is normal in appearance, no lesions.  Pt kept legs tight, so pelvic exam could not be performed. She was clean without any odor.  Rectal: not perform Extremities/musculoskeletal:  No swelling or varicosities noted, no clubbing or cyanosis Psych:seems happy, was uncooperative with pelvic exam and care giver says it is really hard to bath her  Fall risk is low Examination chaperoned by Richelle Ito NP student and caregiver from [place].  Reviewed chart in [facility] media  Impression and plan;  1. Visit for pelvic exam Failed exam I doubt she will ever cooperate for pap  Physical and labs with PCP Mammogram every 2 years

## 2020-02-22 ENCOUNTER — Ambulatory Visit (INDEPENDENT_AMBULATORY_CARE_PROVIDER_SITE_OTHER): Payer: Medicare Other | Admitting: "Endocrinology

## 2020-02-22 ENCOUNTER — Encounter: Payer: Self-pay | Admitting: "Endocrinology

## 2020-02-22 ENCOUNTER — Telehealth: Payer: Self-pay

## 2020-02-22 ENCOUNTER — Other Ambulatory Visit: Payer: Self-pay

## 2020-02-22 VITALS — BP 139/80 | HR 114 | Ht 60.0 in | Wt 208.2 lb

## 2020-02-22 DIAGNOSIS — I10 Essential (primary) hypertension: Secondary | ICD-10-CM

## 2020-02-22 DIAGNOSIS — E118 Type 2 diabetes mellitus with unspecified complications: Secondary | ICD-10-CM

## 2020-02-22 DIAGNOSIS — E559 Vitamin D deficiency, unspecified: Secondary | ICD-10-CM

## 2020-02-22 DIAGNOSIS — E1165 Type 2 diabetes mellitus with hyperglycemia: Secondary | ICD-10-CM

## 2020-02-22 DIAGNOSIS — IMO0002 Reserved for concepts with insufficient information to code with codable children: Secondary | ICD-10-CM

## 2020-02-22 DIAGNOSIS — E782 Mixed hyperlipidemia: Secondary | ICD-10-CM

## 2020-02-22 LAB — POCT GLYCOSYLATED HEMOGLOBIN (HGB A1C): Hemoglobin A1C: 10.7 % — AB (ref 4.0–5.6)

## 2020-02-22 MED ORDER — LANTUS SOLOSTAR 100 UNIT/ML ~~LOC~~ SOPN: PEN_INJECTOR | SUBCUTANEOUS | 11 refills | Status: DC

## 2020-02-22 NOTE — Patient Instructions (Signed)

## 2020-02-22 NOTE — Progress Notes (Signed)
02/22/2020  Endocrinology follow-up note   Subjective:    Patient ID: Ashley Blanchard, female    DOB: Oct 17, 1966.  She is being seen in follow-up for history of uncontrolled type 2 diabetes, hyperlipidemia, hypertension.  She is accompanied by nursing staff who is not immersed in her medical condition. PMD:   Avon Gully, MD  Past Medical History:  Diagnosis Date  . Chronic anemia   . Diabetes mellitus, type II (HCC)   . Hearing loss   . Hyperlipidemia   . Hypertension   . Mild mental retardation    Past Surgical History:  Procedure Laterality Date  . CATARACT EXTRACTION    . RETINAL DETACHMENT SURGERY     Social History   Socioeconomic History  . Marital status: Single    Spouse name: Not on file  . Number of children: Not on file  . Years of education: Not on file  . Highest education level: Not on file  Occupational History  . Not on file  Tobacco Use  . Smoking status: Never Smoker  . Smokeless tobacco: Never Used  Substance and Sexual Activity  . Alcohol use: No  . Drug use: No  . Sexual activity: Never    Birth control/protection: Post-menopausal  Other Topics Concern  . Not on file  Social History Narrative  . Not on file   Social Determinants of Health   Financial Resource Strain:   . Difficulty of Paying Living Expenses:   Food Insecurity:   . Worried About Programme researcher, broadcasting/film/video in the Last Year:   . Barista in the Last Year:   Transportation Needs:   . Freight forwarder (Medical):   Marland Kitchen Lack of Transportation (Non-Medical):   Physical Activity:   . Days of Exercise per Week:   . Minutes of Exercise per Session:   Stress:   . Feeling of Stress :   Social Connections:   . Frequency of Communication with Friends and Family:   . Frequency of Social Gatherings with Friends and Family:   . Attends Religious Services:   . Active Member of Clubs or Organizations:   . Attends Banker Meetings:   Marland Kitchen Marital Status:     Outpatient Encounter Medications as of 02/22/2020  Medication Sig  . aspirin EC 81 MG tablet Take 81 mg by mouth daily.  Marland Kitchen atorvastatin (LIPITOR) 20 MG tablet Take 20 mg by mouth daily.  Marland Kitchen Dextrose, Diabetic Use, (TRUEPLUS GLUCOSE PO) Take by mouth.  . diltiazem (DILACOR XR) 180 MG 24 hr capsule Take 180 mg by mouth daily.  . Ergocalciferol (VITAMIN D2 PO) Take by mouth.  Marland Kitchen FORA LANCETS MISC USE TO TEST BLOOD SUGAR 3 TIMES DAILY.  Marland Kitchen FORA V30A BLOOD GLUCOSE TEST test strip USE TO TEST BLOOD SUGAR 4 TIMES DAILY. BEFORE BREAKFAST, LUNCH, SUPPER AND AT BEDTIME.  . furosemide (LASIX) 20 MG tablet Take 20 mg by mouth.  Marland Kitchen glipiZIDE (GLUCOTROL XL) 5 MG 24 hr tablet TAKE 1 TABLET BY MOUTH ONCE DAILY WITH BREAKFAST.  Marland Kitchen insulin aspart (NOVOLOG FLEXPEN) 100 UNIT/ML FlexPen Inject 10-16 Units into the skin 3 (three) times daily with meals.  . insulin glargine (LANTUS SOLOSTAR) 100 UNIT/ML Solostar Pen INJECT 70 UNITS SUBCUTANEOUSLY AT BEDTIME  . linagliptin (TRADJENTA) 5 MG TABS tablet Take 5 mg by mouth daily.  . metFORMIN (GLUCOPHAGE) 500 MG tablet Take by mouth 2 (two) times daily with a meal.  . ramipril (ALTACE) 5 MG capsule Take 5 mg  by mouth daily.  . risperiDONE (RISPERDAL) 0.5 MG tablet Take 0.5 mg by mouth at bedtime.  . sertraline (ZOLOFT) 100 MG tablet Take 100 mg by mouth daily.  . traZODone (DESYREL) 50 MG tablet Take 50 mg by mouth at bedtime.  . [DISCONTINUED] LANTUS SOLOSTAR 100 UNIT/ML Solostar Pen INJECT 50 UNITS SUBCUTANEOUSLY AT BEDTIME   No facility-administered encounter medications on file as of 02/22/2020.   ALLERGIES: No Known Allergies VACCINATION STATUS:  There is no immunization history on file for this patient.  Diabetes She presents for her follow-up diabetic visit. She has type 2 diabetes mellitus. Onset time: She is not sure when she was diagnosed with diabetes. Her disease course has been improving. There are no hypoglycemic associated symptoms. Pertinent  negatives for hypoglycemia include no confusion, headaches, pallor or seizures. Associated symptoms include polydipsia and polyuria. Pertinent negatives for diabetes include no chest pain, no fatigue and no polyphagia. There are no hypoglycemic complications. Symptoms are improving. There are no diabetic complications. Risk factors for coronary artery disease include diabetes mellitus, hypertension and dyslipidemia. Current diabetic treatment includes oral agent (monotherapy). Compliance with diabetes treatment: She is in a group home due to failure to care for self. Her weight is increasing steadily. She is following a generally unhealthy diet. When asked about meal planning, she reported none. She has not had a previous visit with a dietitian. She rarely participates in exercise. Her home blood glucose trend is decreasing steadily. Her breakfast blood glucose range is generally >200 mg/dl. Her lunch blood glucose range is generally >200 mg/dl. Her dinner blood glucose range is generally >200 mg/dl. Her bedtime blood glucose range is generally >200 mg/dl. Her overall blood glucose range is >200 mg/dl. An ACE inhibitor/angiotensin II receptor blocker is being taken.  Hypertension This is a chronic problem. The current episode started more than 1 year ago. The problem is controlled. Pertinent negatives include no chest pain, headaches, palpitations or shortness of breath. Risk factors for coronary artery disease include dyslipidemia, diabetes mellitus, obesity and sedentary lifestyle. Past treatments include ACE inhibitors.  Hyperlipidemia This is a chronic problem. The current episode started more than 1 year ago. Exacerbating diseases include diabetes and obesity. Pertinent negatives include no chest pain, myalgias or shortness of breath. Risk factors for coronary artery disease include diabetes mellitus, dyslipidemia, hypertension and obesity.    Review of Systems  Constitutional: Negative for fatigue and  unexpected weight change.  HENT: Negative for trouble swallowing and voice change.   Eyes: Negative for visual disturbance.  Respiratory: Negative for cough, shortness of breath and wheezing.   Cardiovascular: Negative for chest pain, palpitations and leg swelling.  Gastrointestinal: Negative for diarrhea, nausea and vomiting.  Endocrine: Positive for polydipsia and polyuria. Negative for cold intolerance, heat intolerance and polyphagia.  Genitourinary: Positive for frequency. Negative for dysuria and flank pain.  Musculoskeletal: Negative for arthralgias and myalgias.  Skin: Negative for color change, pallor, rash and wound.  Neurological: Negative for seizures and headaches.  Psychiatric/Behavioral: Negative for confusion and suicidal ideas.    Objective:    BP 139/80   Pulse (!) 114   Ht 5' (1.524 m)   Wt 208 lb 3.2 oz (94.4 kg)   LMP  (LMP Unknown)   BMI 40.66 kg/m   Wt Readings from Last 3 Encounters:  02/22/20 208 lb 3.2 oz (94.4 kg)  01/19/20 206 lb (93.4 kg)  05/07/19 180 lb (81.6 kg)    Physical Exam  HENT:  Head: Normocephalic and atraumatic.  Eyes: EOM are normal.  Neck: No tracheal deviation present. No thyromegaly present.  Cardiovascular: Normal rate and regular rhythm.  Pulmonary/Chest: Effort normal.  Abdominal: There is no abdominal tenderness. There is no guarding.  Musculoskeletal:        General: No edema. Normal range of motion.     Cervical back: Normal range of motion and neck supple.  Neurological: She is alert. No cranial nerve deficit. Coordination normal.  She is hard of hearing. She has documented intellectual disability, accompanied by her assistant.     Skin: Skin is warm and dry. No rash noted. No erythema. No pallor.  Psychiatric: She has a normal mood and affect.     Lipid Panel     Component Value Date/Time   CHOL 220 (A) 12/23/2018 0000   TRIG 136 12/23/2018 0000   HDL 44 12/23/2018 0000   LDLCALC 149 12/23/2018 0000   Recent  Results (from the past 2160 hour(s))  HgB A1c     Status: Abnormal   Collection Time: 02/22/20 10:53 AM  Result Value Ref Range   Hemoglobin A1C 10.7 (A) 4.0 - 5.6 %   HbA1c POC (<> result, manual entry)     HbA1c, POC (prediabetic range)     HbA1c, POC (controlled diabetic range)       Assessment & Plan:   1. Uncontrolled type 2 diabetes mellitus with complication, without long-term current use of insulin (HCC)  -She has chronically uncontrolled type 2 diabetes of unknown duration.   -She is accompanied by her caretaker and returns with still significantly above target glycemic profile , however improving significantly from last visit.  Her most recent A1c was 10.7%, improving from 12.1%.  She continues to have significantly above target glycemic profile.   - She has no reported gross complications from diabetes however patient remains at a high risk for more acute and chronic complications of diabetes which include CAD, CVA, CKD, retinopathy, and neuropathy.  -  She does not understand the high risk she has, these are all discussed with her care manager from the group home.  - I have counseled the patient on diet management by adopting a carbohydrate restricted/protein rich diet.  - she  admits there is a room for improvement in her diet and drink choices. -  Suggestion is made for her to avoid simple carbohydrates  from her diet including Cakes, Sweet Desserts / Pastries, Ice Cream, Soda (diet and regular), Sweet Tea, Candies, Chips, Cookies, Sweet Pastries,  Store Bought Juices, Alcohol in Excess of  1-2 drinks a day, Artificial Sweeteners, Coffee Creamer, and "Sugar-free" Products. This will help patient to have stable blood glucose profile and potentially avoid unintended weight gain.  - I encouraged the patient and her aide to switch her diet to  unprocessed or minimally processed complex starch and increased protein intake (animal or plant source), fruits, and vegetables.  -  Patient is advised to stick to a routine mealtimes to eat 3 meals  a day and avoid unnecessary snacks ( to snack only to correct hypoglycemia).  - The patient will be scheduled with Norm Salt, RDN, CDE for individualized DM education.  - I have approached patient with the following individualized plan to manage diabetes and patient agrees:   -She is accompanied by her aide, emphasized that she should be accompanied by nursing staff during her subsequent visits.  -Reportedly, she did not change her dietary habits consuming a large quantities of sweetened beverages. -She will continue to need  intensive treatment with basal/bolus insulin in order for her to maintain control of diabetes.   -I discussed and increased her Lantus to 70 units nightly,  continue NovoLog to 10-16  units 3 times daily AC for pre-meal blood glucose readings above 90 mg/dL, associated with strict monitoring of blood glucose 4 times a day-daily before meals and at bedtime.   -I advised her aide to continue Tradjenta 5 mg p.o. daily once breakfast along with glipizide 5 mg XL p.o. daily at breakfast.  She is also on Metformin 500 mg p.o. twice daily after breakfast and supper.  -Patient's care taker is encouraged to call clinic for blood glucose levels less than 70 or above 300 mg /dl.   - She is not suitable candidate for SGLT2 inhibitors nor  therapy. - Patient specific target  A1c;  LDL, HDL, Triglycerides, and  Waist Circumference were discussed in detail.  2) BP/HT : Her blood pressure is controlled to target.   She is advised to continue her current blood pressure medications including ramipril 5 mg p.o. daily with breakfast.    3) Lipids/HPL: Her recent labs show uncontrolled LDL at 149.she is advised to continue atorvastatin 20 mg p.o. nightly.      4)  Weight/Diet: she continues to gain weight, current BMI 40.  This clearly is complicating her diabetes care.  She is a candidate for modest weight loss.  She  cannot exercise optimally.     exercise, and detailed carbohydrates information provided.  5) Chronic Care/Health Maintenance:  -Patient is on ACEI/ARB and Statin medications and encouraged to continue to follow up with Ophthalmology, Podiatrist at least yearly or according to recommendations, and advised to   stay away from smoking. I have recommended yearly flu vaccine and pneumonia vaccination at least every 5 years;  60 minutes of walking 4 days a week is recommended for her , and  sleep for at least 7 hours a day. - Patient to bring meter and  blood glucose logs during her next visit.   - I advised patient to maintain close follow up with Rosita Fire, MD for primary care needs.   - Time spent on this patient care encounter:  35 min, of which > 50% was spent in  counseling and the rest reviewing her blood glucose logs , discussing her hypoglycemia and hyperglycemia episodes, reviewing her current and  previous labs / studies  ( including abstraction from other facilities) and medications  doses and developing a  long term treatment plan and documenting her care.   Please refer to Patient Instructions for Blood Glucose Monitoring and Insulin/Medications Dosing Guide"  in media tab for additional information. Please  also refer to " Patient Self Inventory" in the Media  tab for reviewed elements of pertinent patient history.  Selene Ebbs has intellectual disability, I involved  her aide who participated in the discussions, expressed understanding, and voiced agreement with the above plans.  All questions were answered to her satisfaction. she is encouraged to contact clinic should she have any questions or concerns prior to her return visit.    Follow up plan: - Return in about 3 months (around 05/23/2020) for Bring Meter and Logs- A1c in Office, Follow up with Pre-visit Labs.  Glade Lloyd, MD Phone: [phone]  Fax: [phone]   02/22/2020, 6:57 PM

## 2020-02-22 NOTE — Telephone Encounter (Signed)
Discussed orders with Tammy from [facility].

## 2020-02-22 NOTE — Telephone Encounter (Signed)
Please call regarding Dr Dennie Bible orders from today

## 2020-02-24 ENCOUNTER — Other Ambulatory Visit: Payer: Self-pay | Admitting: "Endocrinology

## 2020-03-24 ENCOUNTER — Other Ambulatory Visit: Payer: Self-pay | Admitting: "Endocrinology

## 2020-05-20 LAB — COMPLETE METABOLIC PANEL WITH GFR
AG Ratio: 1.1 (calc) (ref 1.0–2.5)
ALT: 15 U/L (ref 6–29)
AST: 16 U/L (ref 10–35)
Albumin: 3.5 g/dL — ABNORMAL LOW (ref 3.6–5.1)
Alkaline phosphatase (APISO): 119 U/L (ref 37–153)
BUN/Creatinine Ratio: 18 (calc) (ref 6–22)
BUN: 21 mg/dL (ref 7–25)
CO2: 30 mmol/L (ref 20–32)
Calcium: 10.3 mg/dL (ref 8.6–10.4)
Chloride: 99 mmol/L (ref 98–110)
Creat: 1.2 mg/dL — ABNORMAL HIGH (ref 0.50–1.05)
GFR, Est African American: 59 mL/min/{1.73_m2} — ABNORMAL LOW (ref >=60)
GFR, Est Non African American: 51 mL/min/{1.73_m2} — ABNORMAL LOW (ref >=60)
Globulin: 3.1 g/dL (calc) (ref 1.9–3.7)
Glucose, Bld: 233 mg/dL — ABNORMAL HIGH (ref 65–99)
Potassium: 4.7 mmol/L (ref 3.5–5.3)
Sodium: 139 mmol/L (ref 135–146)
Total Bilirubin: 0.3 mg/dL (ref 0.2–1.2)
Total Protein: 6.6 g/dL (ref 6.1–8.1)

## 2020-05-20 LAB — TSH: TSH: 4.08 mIU/L

## 2020-05-20 LAB — VITAMIN D 25 HYDROXY (VIT D DEFICIENCY, FRACTURES): Vit D, 25-Hydroxy: 133 ng/mL — ABNORMAL HIGH (ref 30–100)

## 2020-05-20 LAB — LIPID PANEL
Cholesterol: 198 mg/dL (ref <200)
HDL: 50 mg/dL (ref >=50)
LDL Cholesterol (Calc): 129 mg/dL (calc) — ABNORMAL HIGH
Non-HDL Cholesterol (Calc): 148 mg/dL (calc) — ABNORMAL HIGH (ref <130)
Total CHOL/HDL Ratio: 4 (calc) (ref <5.0)
Triglycerides: 86 mg/dL (ref <150)

## 2020-05-20 LAB — MICROALBUMIN / CREATININE URINE RATIO
Creatinine, Urine: 94 mg/dL (ref 20–275)
Microalb, Ur: <0.2 mg/dL

## 2020-05-20 LAB — T4, FREE: Free T4: 0.9 ng/dL (ref 0.8–1.8)

## 2020-05-24 ENCOUNTER — Ambulatory Visit (INDEPENDENT_AMBULATORY_CARE_PROVIDER_SITE_OTHER): Payer: Medicare Other | Admitting: "Endocrinology

## 2020-05-24 ENCOUNTER — Other Ambulatory Visit: Payer: Self-pay

## 2020-05-24 ENCOUNTER — Encounter: Payer: Self-pay | Admitting: "Endocrinology

## 2020-05-24 VITALS — BP 132/83 | HR 106 | Ht 60.0 in | Wt 207.8 lb

## 2020-05-24 DIAGNOSIS — E559 Vitamin D deficiency, unspecified: Secondary | ICD-10-CM | POA: Diagnosis not present

## 2020-05-24 DIAGNOSIS — E118 Type 2 diabetes mellitus with unspecified complications: Secondary | ICD-10-CM

## 2020-05-24 DIAGNOSIS — E1165 Type 2 diabetes mellitus with hyperglycemia: Secondary | ICD-10-CM

## 2020-05-24 DIAGNOSIS — IMO0002 Reserved for concepts with insufficient information to code with codable children: Secondary | ICD-10-CM

## 2020-05-24 DIAGNOSIS — I10 Essential (primary) hypertension: Secondary | ICD-10-CM

## 2020-05-24 DIAGNOSIS — E782 Mixed hyperlipidemia: Secondary | ICD-10-CM

## 2020-05-24 LAB — POCT GLYCOSYLATED HEMOGLOBIN (HGB A1C): Hemoglobin A1C: 8.8 % — AB (ref 4.0–5.6)

## 2020-05-24 MED ORDER — ATORVASTATIN CALCIUM 40 MG PO TABS: 40.0000 mg | ORAL_TABLET | Freq: Every day | ORAL | 6 refills | Status: AC

## 2020-05-24 NOTE — Progress Notes (Signed)
05/24/2020  Endocrinology follow-up note   Subjective:    Patient ID: Ashley Blanchard, female    DOB: 1966/01/24.  She is being seen in follow-up for history of uncontrolled type 2 diabetes, hyperlipidemia, hypertension.  She is accompanied by nursing staff who is not immersed in her medical condition. PMD:   Avon Gully, MD  Past Medical History:  Diagnosis Date  . Chronic anemia   . Diabetes mellitus, type II (HCC)   . Hearing loss   . Hyperlipidemia   . Hypertension   . Mild mental retardation    Past Surgical History:  Procedure Laterality Date  . CATARACT EXTRACTION    . RETINAL DETACHMENT SURGERY     Social History   Socioeconomic History  . Marital status: Single    Spouse name: Not on file  . Number of children: Not on file  . Years of education: Not on file  . Highest education level: Not on file  Occupational History  . Not on file  Tobacco Use  . Smoking status: Never Smoker  . Smokeless tobacco: Never Used  Vaping Use  . Vaping Use: Never used  Substance and Sexual Activity  . Alcohol use: No  . Drug use: No  . Sexual activity: Never    Birth control/protection: Post-menopausal  Other Topics Concern  . Not on file  Social History Narrative  . Not on file   Social Determinants of Health   Financial Resource Strain:   . Difficulty of Paying Living Expenses:   Food Insecurity:   . Worried About Programme researcher, broadcasting/film/video in the Last Year:   . Barista in the Last Year:   Transportation Needs:   . Freight forwarder (Medical):   Marland Kitchen Lack of Transportation (Non-Medical):   Physical Activity:   . Days of Exercise per Week:   . Minutes of Exercise per Session:   Stress:   . Feeling of Stress :   Social Connections:   . Frequency of Communication with Friends and Family:   . Frequency of Social Gatherings with Friends and Family:   . Attends Religious Services:   . Active Member of Clubs or Organizations:   . Attends Banker  Meetings:   Marland Kitchen Marital Status:    Outpatient Encounter Medications as of 05/24/2020  Medication Sig  . aspirin EC 81 MG tablet Take 81 mg by mouth daily.  Marland Kitchen atorvastatin (LIPITOR) 40 MG tablet Take 1 tablet (40 mg total) by mouth daily.  Marland Kitchen Dextrose, Diabetic Use, (TRUEPLUS GLUCOSE PO) Take by mouth.  . diltiazem (DILACOR XR) 180 MG 24 hr capsule Take 180 mg by mouth daily.  Marland Kitchen FORA LANCETS MISC USE TO TEST BLOOD SUGAR 3 TIMES DAILY.  Marland Kitchen FORA V30A BLOOD GLUCOSE TEST test strip USE TO TEST BLOOD SUGAR 4 TIMES DAILY. BEFORE BREAKFAST, LUNCH, SUPPER AND AT BEDTIME.  . furosemide (LASIX) 20 MG tablet Take 20 mg by mouth.  Marland Kitchen GLIPIZIDE XL 5 MG 24 hr tablet TAKE 1 TABLET BY MOUTH DAILY AT BREAKFAST  . insulin aspart (NOVOLOG FLEXPEN) 100 UNIT/ML FlexPen Inject 10-16 Units into the skin 3 (three) times daily with meals.  . insulin glargine (LANTUS SOLOSTAR) 100 UNIT/ML Solostar Pen INJECT 70 UNITS SUBCUTANEOUSLY AT BEDTIME  . metFORMIN (GLUCOPHAGE) 500 MG tablet Take by mouth 2 (two) times daily with a meal.  . ramipril (ALTACE) 5 MG capsule Take 5 mg by mouth daily.  . risperiDONE (RISPERDAL) 0.5 MG tablet Take 0.5 mg  by mouth at bedtime.  . sertraline (ZOLOFT) 100 MG tablet Take 100 mg by mouth daily.  . traZODone (DESYREL) 50 MG tablet Take 50 mg by mouth at bedtime.  . [DISCONTINUED] atorvastatin (LIPITOR) 20 MG tablet Take 20 mg by mouth daily.  . [DISCONTINUED] Ergocalciferol (VITAMIN D2 PO) Take by mouth.  . [DISCONTINUED] linagliptin (TRADJENTA) 5 MG TABS tablet Take 5 mg by mouth daily.   No facility-administered encounter medications on file as of 05/24/2020.   ALLERGIES: No Known Allergies VACCINATION STATUS:  There is no immunization history on file for this patient.  Diabetes She presents for her follow-up diabetic visit. She has type 2 diabetes mellitus. Onset time: She is not sure when she was diagnosed with diabetes. Her disease course has been worsening. There are no hypoglycemic  associated symptoms. Pertinent negatives for hypoglycemia include no confusion, headaches, pallor or seizures. Associated symptoms include polydipsia and polyuria. Pertinent negatives for diabetes include no chest pain, no fatigue and no polyphagia. There are no hypoglycemic complications. Symptoms are improving. There are no diabetic complications. Risk factors for coronary artery disease include diabetes mellitus, hypertension and dyslipidemia. Current diabetic treatment includes oral agent (monotherapy). Compliance with diabetes treatment: She is in a group home due to failure to care for self. Her weight is fluctuating minimally. She is following a generally unhealthy diet. When asked about meal planning, she reported none. She has not had a previous visit with a dietitian. She rarely participates in exercise. Her home blood glucose trend is decreasing steadily. Her breakfast blood glucose range is generally 180-200 mg/dl. Her lunch blood glucose range is generally 180-200 mg/dl. Her dinner blood glucose range is generally 180-200 mg/dl. Her bedtime blood glucose range is generally 180-200 mg/dl. Her overall blood glucose range is 180-200 mg/dl. An ACE inhibitor/angiotensin II receptor blocker is being taken.  Hypertension This is a chronic problem. The current episode started more than 1 year ago. The problem is controlled. Pertinent negatives include no chest pain, headaches, palpitations or shortness of breath. Risk factors for coronary artery disease include dyslipidemia, diabetes mellitus, obesity and sedentary lifestyle. Past treatments include ACE inhibitors.  Hyperlipidemia This is a chronic problem. The current episode started more than 1 year ago. Exacerbating diseases include diabetes and obesity. Pertinent negatives include no chest pain, myalgias or shortness of breath. Risk factors for coronary artery disease include diabetes mellitus, dyslipidemia, hypertension and obesity.    Review of  Systems  Constitutional: Negative for fatigue and unexpected weight change.  HENT: Negative for trouble swallowing and voice change.   Eyes: Negative for visual disturbance.  Respiratory: Negative for cough, shortness of breath and wheezing.   Cardiovascular: Negative for chest pain, palpitations and leg swelling.  Gastrointestinal: Negative for diarrhea, nausea and vomiting.  Endocrine: Positive for polydipsia and polyuria. Negative for cold intolerance, heat intolerance and polyphagia.  Genitourinary: Positive for frequency. Negative for dysuria and flank pain.  Musculoskeletal: Negative for arthralgias and myalgias.  Skin: Negative for color change, pallor, rash and wound.  Neurological: Negative for seizures and headaches.  Psychiatric/Behavioral: Negative for confusion and suicidal ideas.    Objective:    BP 132/83   Pulse (!) 106   Ht 5' (1.524 m)   Wt 207 lb 12.8 oz (94.3 kg)   LMP  (LMP Unknown)   BMI 40.58 kg/m   Wt Readings from Last 3 Encounters:  05/24/20 207 lb 12.8 oz (94.3 kg)  02/22/20 208 lb 3.2 oz (94.4 kg)  01/19/20 206 lb (93.4  kg)    Physical Exam HENT:     Head: Normocephalic and atraumatic.  Neck:     Thyroid: No thyromegaly.     Trachea: No tracheal deviation.  Cardiovascular:     Rate and Rhythm: Normal rate and regular rhythm.  Pulmonary:     Effort: Pulmonary effort is normal.  Abdominal:     Tenderness: There is no abdominal tenderness. There is no guarding.  Musculoskeletal:        General: Normal range of motion.     Cervical back: Normal range of motion and neck supple.  Skin:    General: Skin is warm and dry.     Coloration: Skin is not pale.     Findings: No erythema or rash.  Neurological:     Mental Status: She is alert.     Cranial Nerves: No cranial nerve deficit.     Coordination: Coordination normal.     Comments: She is hard of hearing. She has documented intellectual disability, accompanied by her assistant.          Lipid Panel     Component Value Date/Time   CHOL 198 05/19/2020 0820   TRIG 86 05/19/2020 0820   HDL 50 05/19/2020 0820   CHOLHDL 4.0 05/19/2020 0820   LDLCALC 129 (H) 05/19/2020 0820   Recent Results (from the past 2160 hour(s))  COMPLETE METABOLIC PANEL WITH GFR     Status: Abnormal   Collection Time: 05/19/20  8:20 AM  Result Value Ref Range   Glucose, Bld 233 (H) 65 - 99 mg/dL    Comment: .            Fasting reference interval . For someone without known diabetes, a glucose value >125 mg/dL indicates that they may have diabetes and this should be confirmed with a follow-up test. .    BUN 21 7 - 25 mg/dL   Creat 1.61 (H) 0.96 - 1.05 mg/dL    Comment: For patients >13 years of age, the reference limit for Creatinine is approximately 13% higher for people identified as African-American. .    GFR, Est Non African American 51 (L) > OR = 60 mL/min/1.32m2   GFR, Est African American 59 (L) > OR = 60 mL/min/1.5m2   BUN/Creatinine Ratio 18 6 - 22 (calc)   Sodium 139 135 - 146 mmol/L   Potassium 4.7 3.5 - 5.3 mmol/L   Chloride 99 98 - 110 mmol/L   CO2 30 20 - 32 mmol/L   Calcium 10.3 8.6 - 10.4 mg/dL   Total Protein 6.6 6.1 - 8.1 g/dL   Albumin 3.5 (L) 3.6 - 5.1 g/dL   Globulin 3.1 1.9 - 3.7 g/dL (calc)   AG Ratio 1.1 1.0 - 2.5 (calc)   Total Bilirubin 0.3 0.2 - 1.2 mg/dL   Alkaline phosphatase (APISO) 119 37 - 153 U/L   AST 16 10 - 35 U/L   ALT 15 6 - 29 U/L  Microalbumin / creatinine urine ratio     Status: None   Collection Time: 05/19/20  8:20 AM  Result Value Ref Range   Creatinine, Urine 94 20 - 275 mg/dL   Microalb, Ur <0.4 mg/dL    Comment: Reference Range Not established    Microalb Creat Ratio NOTE <30 mcg/mg creat    Comment: NOTE: The urine albumin value is less than  0.2 mg/dL therefore we are unable to calculate  excretion and/or creatinine ratio. . The ADA defines abnormalities in albumin excretion as follows: .  Category          Result (mcg/mg creatinine) . Normal                    <30 Microalbuminuria         30-299  Clinical albuminuria   > OR = 300 . The ADA recommends that at least two of three specimens collected within a 3-6 month period be abnormal before considering a patient to be within a diagnostic category.   Lipid panel     Status: Abnormal   Collection Time: 05/19/20  8:20 AM  Result Value Ref Range   Cholesterol 198 <200 mg/dL   HDL 50 > OR = 50 mg/dL   Triglycerides 86 <106 mg/dL   LDL Cholesterol (Calc) 129 (H) mg/dL (calc)    Comment: Reference range: <100 . Desirable range <100 mg/dL for primary prevention;   <70 mg/dL for patients with CHD or diabetic patients  with > or = 2 CHD risk factors. Marland Kitchen LDL-C is now calculated using the Martin-Hopkins  calculation, which is a validated novel method providing  better accuracy than the Friedewald equation in the  estimation of LDL-C.  Horald Pollen et al. Lenox Ahr. 2694;854(62): 2061-2068  (http://education.[facility].com/faq/FAQ164)    Total CHOL/HDL Ratio 4.0 <5.0 (calc)   Non-HDL Cholesterol (Calc) 148 (H) <130 mg/dL (calc)    Comment: For patients with diabetes plus 1 major ASCVD risk  factor, treating to a non-HDL-C goal of <100 mg/dL  (LDL-C of <70 mg/dL) is considered a therapeutic  option.   TSH     Status: None   Collection Time: 05/19/20  8:20 AM  Result Value Ref Range   TSH 4.08 mIU/L    Comment:           Reference Range .           > or = 20 Years  0.40-4.50 .                Pregnancy Ranges           First trimester    0.26-2.66           Second trimester   0.55-2.73           Third trimester    0.43-2.91   T4, free     Status: None   Collection Time: 05/19/20  8:20 AM  Result Value Ref Range   Free T4 0.9 0.8 - 1.8 ng/dL  VITAMIN D 25 Hydroxy (Vit-D Deficiency, Fractures)     Status: Abnormal   Collection Time: 05/19/20  8:20 AM  Result Value Ref Range   Vit D, 25-Hydroxy 133 (H) 30 - 100 ng/mL    Comment:  Vitamin D Status         25-OH Vitamin D: . Deficiency:                    <20 ng/mL Insufficiency:             20 - 29 ng/mL Optimal:                 > or = 30 ng/mL . For 25-OH Vitamin D testing on patients on  D2-supplementation and patients for whom quantitation  of D2 and D3 fractions is required, the [facility](TM) 25-OH VIT D, (D2,D3), LC/MS/MS is recommended: order  code 35009 (patients >21yrs). See Note 1 . Note 1 . For additional information, please refer to  http://education.[facility].com/faq/FAQ199  (This link is being  provided for informational/ educational purposes only.)   HgB A1c     Status: Abnormal   Collection Time: 05/24/20 10:57 AM  Result Value Ref Range   Hemoglobin A1C 8.8 (A) 4.0 - 5.6 %   HbA1c POC (<> result, manual entry)     HbA1c, POC (prediabetic range)     HbA1c, POC (controlled diabetic range)       Assessment & Plan:   1. Uncontrolled type 2 diabetes mellitus with complication, without long-term current use of insulin (HCC)  -She has chronically uncontrolled type 2 diabetes of unknown duration.   -She is accompanied by her caretaker and returns with improving glycemic profile, point-of-care A1c of 8.8% improving from 12.1%.   -No hypoglycemia documented.   - She has no reported gross complications from diabetes however patient remains at a high risk for more acute and chronic complications of diabetes which include CAD, CVA, CKD, retinopathy, and neuropathy.  -  She does not understand the high risk she has, these are all discussed with her care manager from the group home.  - I have counseled the patient on diet management by adopting a carbohydrate restricted/protein rich diet.  - she  admits there is a room for improvement in her diet and drink choices. -  Suggestion is made for her to avoid simple carbohydrates  from her diet including Cakes, Sweet Desserts / Pastries, Ice Cream, Soda (diet and regular), Sweet Tea, Candies,  Chips, Cookies, Sweet Pastries,  Store Bought Juices, Alcohol in Excess of  1-2 drinks a day, Artificial Sweeteners, Coffee Creamer, and "Sugar-free" Products. This will help patient to have stable blood glucose profile and potentially avoid unintended weight gain.  - I encouraged the patient and her aide to switch her diet to  unprocessed or minimally processed complex starch and increased protein intake (animal or plant source), fruits, and vegetables.  - Patient is advised to stick to a routine mealtimes to eat 3 meals  a day and avoid unnecessary snacks ( to snack only to correct hypoglycemia).  - The patient will be scheduled with Norm SaltPenny Crumpton, RDN, CDE for individualized DM education.  - I have approached patient with the following individualized plan to manage diabetes and patient agrees:   -She is accompanied by her aide, emphasized that she should be accompanied by nursing staff during her subsequent visits.  -Reportedly, she did not change her dietary habits consuming a large quantities of sweetened beverages. -She will continue to need intensive treatment with basal/bolus insulin in order for her to maintain control of diabetes.   -I discussed and increase her Lantus to 80 units nightly, increase NovoLog to 15-21   units 3 times daily AC for pre-meal blood glucose readings above 90 mg/dL, associated with strict monitoring of blood glucose 4 times a day-daily before meals and at bedtime.   -I advised her aide to continue Tradjenta 5 mg p.o. daily once breakfast along with glipizide 5 mg XL p.o. daily at breakfast.  She is also on Metformin 500 mg p.o. twice daily after breakfast and supper.  -Patient's care taker is encouraged to call clinic for blood glucose levels less than 70 or above 300 mg /dl.   - She is not suitable candidate for SGLT2 inhibitors nor  therapy. - Patient specific target  A1c;  LDL, HDL, Triglycerides, and  Waist Circumference were discussed in detail.  2)  BP/HT :   She is advised to continue her current blood pressure medications including ramipril 5 mg  p.o. daily with breakfast.    3) Lipids/HPL: Her recent labs show uncontrolled LDL at 149.I discussed and increase her atorvastatin to 40 mg p.o. nightly.    4) vitamin D deficiency: She is s/p vitamin D replacement.  Her current 25 hydroxy vitamin D level is high at 133.  Her vitamin D supplements will be suspended until next measurement.      5)  Weight/Diet: she continues to gain weight, current BMI 40.  This clearly is complicating her diabetes care.  She is a candidate for modest weight loss.  She cannot exercise optimally.     exercise, and detailed carbohydrates information provided.  6) Chronic Care/Health Maintenance:  -Patient is on ACEI/ARB and Statin medications and encouraged to continue to follow up with Ophthalmology, Podiatrist at least yearly or according to recommendations, and advised to   stay away from smoking. I have recommended yearly flu vaccine and pneumonia vaccination at least every 5 years;  60 minutes of walking 4 days a week is recommended for her , and  sleep for at least 7 hours a day. - Patient to bring meter and  blood glucose logs during her next visit.   - I advised patient to maintain close follow up with Avon Gully, MD for primary care needs.   - Time spent on this patient care encounter:  35 min, of which > 50% was spent in  counseling and the rest reviewing her blood glucose logs , discussing her hypoglycemia and hyperglycemia episodes, reviewing her current and  previous labs / studies  ( including abstraction from other facilities) and medications  doses and developing a  long term treatment plan and documenting her care.   Please refer to Patient Instructions for Blood Glucose Monitoring and Insulin/Medications Dosing Guide"  in media tab for additional information. Please  also refer to " Patient Self Inventory" in the Media  tab for reviewed elements  of pertinent patient history.  Ashley Blanchard and her accompanying aide participated in the discussions, expressed understanding, and voiced agreement with the above plans.  All questions were answered to her satisfaction. she is encouraged to contact clinic should she have any questions or concerns prior to her return visit.    Follow up plan: - Return in about 4 months (around 09/24/2020) for Bring Meter and Logs- A1c in Office.  Marquis Lunch, MD Phone: [phone]  Fax: [phone]   05/24/2020, 12:54 PM

## 2020-05-24 NOTE — Patient Instructions (Signed)

## 2020-07-28 ENCOUNTER — Other Ambulatory Visit: Payer: Self-pay | Admitting: "Endocrinology

## 2020-07-29 ENCOUNTER — Other Ambulatory Visit: Payer: Self-pay | Admitting: "Endocrinology

## 2020-08-09 ENCOUNTER — Other Ambulatory Visit: Payer: Self-pay

## 2020-08-09 ENCOUNTER — Ambulatory Visit (INDEPENDENT_AMBULATORY_CARE_PROVIDER_SITE_OTHER): Payer: Medicare Other | Admitting: "Endocrinology

## 2020-08-09 ENCOUNTER — Encounter: Payer: Self-pay | Admitting: "Endocrinology

## 2020-08-09 VITALS — BP 128/64 | HR 108 | Ht 60.0 in | Wt 208.6 lb

## 2020-08-09 DIAGNOSIS — E1165 Type 2 diabetes mellitus with hyperglycemia: Secondary | ICD-10-CM

## 2020-08-09 DIAGNOSIS — E782 Mixed hyperlipidemia: Secondary | ICD-10-CM

## 2020-08-09 DIAGNOSIS — I10 Essential (primary) hypertension: Secondary | ICD-10-CM

## 2020-08-09 DIAGNOSIS — E118 Type 2 diabetes mellitus with unspecified complications: Secondary | ICD-10-CM | POA: Diagnosis not present

## 2020-08-09 DIAGNOSIS — IMO0002 Reserved for concepts with insufficient information to code with codable children: Secondary | ICD-10-CM

## 2020-08-09 MED ORDER — NOVOLOG FLEXPEN 100 UNIT/ML ~~LOC~~ SOPN: 20.0000 [IU] | PEN_INJECTOR | Freq: Three times a day (TID) | SUBCUTANEOUS | 2 refills | Status: DC

## 2020-08-09 NOTE — Progress Notes (Signed)
08/09/2020  Endocrinology follow-up note   Subjective:    Patient ID: Ashley Blanchard, female    DOB: 10-22-1966.  She is being seen in follow-up for history of uncontrolled type 2 diabetes, hyperlipidemia, hypertension.  She is accompanied by nursing staff who is not immersed in her medical condition. PMD:   Avon Gully, MD  Past Medical History:  Diagnosis Date  . Chronic anemia   . Diabetes mellitus, type II (HCC)   . Hearing loss   . Hyperlipidemia   . Hypertension   . Mild mental retardation    Past Surgical History:  Procedure Laterality Date  . CATARACT EXTRACTION    . RETINAL DETACHMENT SURGERY     Social History   Socioeconomic History  . Marital status: Single    Spouse name: Not on file  . Number of children: Not on file  . Years of education: Not on file  . Highest education level: Not on file  Occupational History  . Not on file  Tobacco Use  . Smoking status: Never Smoker  . Smokeless tobacco: Never Used  Vaping Use  . Vaping Use: Never used  Substance and Sexual Activity  . Alcohol use: No  . Drug use: No  . Sexual activity: Never    Birth control/protection: Post-menopausal  Other Topics Concern  . Not on file  Social History Narrative  . Not on file   Social Determinants of Health   Financial Resource Strain:   . Difficulty of Paying Living Expenses: Not on file  Food Insecurity:   . Worried About Programme researcher, broadcasting/film/video in the Last Year: Not on file  . Ran Out of Food in the Last Year: Not on file  Transportation Needs:   . Lack of Transportation (Medical): Not on file  . Lack of Transportation (Non-Medical): Not on file  Physical Activity:   . Days of Exercise per Week: Not on file  . Minutes of Exercise per Session: Not on file  Stress:   . Feeling of Stress : Not on file  Social Connections:   . Frequency of Communication with Friends and Family: Not on file  . Frequency of Social Gatherings with Friends and Family: Not on file  .  Attends Religious Services: Not on file  . Active Member of Clubs or Organizations: Not on file  . Attends Banker Meetings: Not on file  . Marital Status: Not on file   Outpatient Encounter Medications as of 08/09/2020  Medication Sig  . aspirin EC 81 MG tablet Take 81 mg by mouth daily.  Marland Kitchen atorvastatin (LIPITOR) 40 MG tablet Take 1 tablet (40 mg total) by mouth daily.  Marland Kitchen Dextrose, Diabetic Use, (TRUEPLUS GLUCOSE PO) Take by mouth.  . diltiazem (DILACOR XR) 180 MG 24 hr capsule Take 180 mg by mouth daily.  Marland Kitchen FORA LANCETS MISC USE TO TEST BLOOD SUGAR 3 TIMES DAILY.  Marland Kitchen FORA V30A BLOOD GLUCOSE TEST test strip USE TO TEST BLOOD SUGAR 4 TIMES DAILY. BEFORE BREAKFAST, LUNCH, SUPPER AND AT BEDTIME.  . furosemide (LASIX) 20 MG tablet Take 20 mg by mouth.  Marland Kitchen GLIPIZIDE XL 5 MG 24 hr tablet TAKE 1 TABLET BY MOUTH DAILY AT BREAKFAST  . insulin aspart (NOVOLOG FLEXPEN) 100 UNIT/ML FlexPen Inject 20-26 Units into the skin 3 (three) times daily with meals.  Marland Kitchen LANTUS SOLOSTAR 100 UNIT/ML Solostar Pen INJECT 80 UNITS SUBCUTANEOUSLY AT BEDTIME.  . metFORMIN (GLUCOPHAGE) 500 MG tablet Take by mouth 2 (two) times daily  with a meal.  . ramipril (ALTACE) 5 MG capsule Take 5 mg by mouth daily.  . risperiDONE (RISPERDAL) 0.5 MG tablet Take 0.5 mg by mouth at bedtime.  . sertraline (ZOLOFT) 100 MG tablet Take 100 mg by mouth daily.  . traZODone (DESYREL) 50 MG tablet Take 50 mg by mouth at bedtime.  . [DISCONTINUED] insulin aspart (NOVOLOG FLEXPEN) 100 UNIT/ML FlexPen Inject 10-16 Units into the skin 3 (three) times daily with meals.   No facility-administered encounter medications on file as of 08/09/2020.   ALLERGIES: No Known Allergies VACCINATION STATUS:  There is no immunization history on file for this patient.  Diabetes She presents for her follow-up diabetic visit. She has type 2 diabetes mellitus. Onset time: She is not sure when she was diagnosed with diabetes. Her disease course has  been worsening. There are no hypoglycemic associated symptoms. Pertinent negatives for hypoglycemia include no confusion, headaches, pallor or seizures. Associated symptoms include polydipsia and polyuria. Pertinent negatives for diabetes include no chest pain, no fatigue and no polyphagia. There are no hypoglycemic complications. Symptoms are worsening. There are no diabetic complications. Risk factors for coronary artery disease include diabetes mellitus, hypertension and dyslipidemia. Current diabetic treatment includes oral agent (monotherapy). Compliance with diabetes treatment: She is in a group home due to failure to care for self. Her weight is increasing steadily. She is following a generally unhealthy diet. When asked about meal planning, she reported none. She has not had a previous visit with a dietitian. She rarely participates in exercise. Her home blood glucose trend is increasing steadily. Her breakfast blood glucose range is generally >200 mg/dl. Her lunch blood glucose range is generally >200 mg/dl. Her dinner blood glucose range is generally >200 mg/dl. Her bedtime blood glucose range is generally >200 mg/dl. Her overall blood glucose range is >200 mg/dl. (She is accompanied by nursing home staff.  Review of her logs showing significantly above target glycemic profile both fasting and postprandial.  She has access for sweetened beverages.  She did not document any hypoglycemia.  Her recent A1c was 8.8% in July 2021.) An ACE inhibitor/angiotensin II receptor blocker is being taken.  Hypertension This is a chronic problem. The current episode started more than 1 year ago. The problem is controlled. Pertinent negatives include no chest pain, headaches, palpitations or shortness of breath. Risk factors for coronary artery disease include dyslipidemia, diabetes mellitus, obesity and sedentary lifestyle. Past treatments include ACE inhibitors.  Hyperlipidemia This is a chronic problem. The current  episode started more than 1 year ago. Exacerbating diseases include diabetes and obesity. Pertinent negatives include no chest pain, myalgias or shortness of breath. Risk factors for coronary artery disease include diabetes mellitus, dyslipidemia, hypertension and obesity.    Review of Systems  Constitutional: Negative for fatigue and unexpected weight change.  HENT: Negative for trouble swallowing and voice change.   Eyes: Negative for visual disturbance.  Respiratory: Negative for cough, shortness of breath and wheezing.   Cardiovascular: Negative for chest pain, palpitations and leg swelling.  Gastrointestinal: Negative for diarrhea, nausea and vomiting.  Endocrine: Positive for polydipsia and polyuria. Negative for cold intolerance, heat intolerance and polyphagia.  Genitourinary: Positive for frequency. Negative for dysuria and flank pain.  Musculoskeletal: Negative for arthralgias and myalgias.  Skin: Negative for color change, pallor, rash and wound.  Neurological: Negative for seizures and headaches.  Psychiatric/Behavioral: Negative for confusion and suicidal ideas.    Objective:    BP 128/64   Pulse (!) 108  Ht 5' (1.524 m)   Wt 208 lb 9.6 oz (94.6 kg)   LMP  (LMP Unknown)   BMI 40.74 kg/m   Wt Readings from Last 3 Encounters:  08/09/20 208 lb 9.6 oz (94.6 kg)  05/24/20 207 lb 12.8 oz (94.3 kg)  02/22/20 208 lb 3.2 oz (94.4 kg)    Physical Exam HENT:     Head: Normocephalic and atraumatic.  Neck:     Thyroid: No thyromegaly.     Trachea: No tracheal deviation.  Cardiovascular:     Rate and Rhythm: Normal rate and regular rhythm.  Pulmonary:     Effort: Pulmonary effort is normal.  Abdominal:     Tenderness: There is no abdominal tenderness. There is no guarding.  Musculoskeletal:        General: Normal range of motion.     Cervical back: Normal range of motion and neck supple.  Skin:    General: Skin is warm and dry.     Coloration: Skin is not pale.      Findings: No erythema or rash.  Neurological:     Mental Status: She is alert.     Cranial Nerves: No cranial nerve deficit.     Coordination: Coordination normal.     Comments: She is hard of hearing. She has documented intellectual disability, accompanied by her assistant.         Lipid Panel     Component Value Date/Time   CHOL 198 05/19/2020 0820   TRIG 86 05/19/2020 0820   HDL 50 05/19/2020 0820   CHOLHDL 4.0 05/19/2020 0820   LDLCALC 129 (H) 05/19/2020 0820   Recent Results (from the past 2160 hour(s))  COMPLETE METABOLIC PANEL WITH GFR     Status: Abnormal   Collection Time: 05/19/20  8:20 AM  Result Value Ref Range   Glucose, Bld 233 (H) 65 - 99 mg/dL    Comment: .            Fasting reference interval . For someone without known diabetes, a glucose value >125 mg/dL indicates that they may have diabetes and this should be confirmed with a follow-up test. .    BUN 21 7 - 25 mg/dL   Creat 9.93 (H) 5.70 - 1.05 mg/dL    Comment: For patients >66 years of age, the reference limit for Creatinine is approximately 13% higher for people identified as African-American. .    GFR, Est Non African American 51 (L) > OR = 60 mL/min/1.32m2   GFR, Est African American 59 (L) > OR = 60 mL/min/1.55m2   BUN/Creatinine Ratio 18 6 - 22 (calc)   Sodium 139 135 - 146 mmol/L   Potassium 4.7 3.5 - 5.3 mmol/L   Chloride 99 98 - 110 mmol/L   CO2 30 20 - 32 mmol/L   Calcium 10.3 8.6 - 10.4 mg/dL   Total Protein 6.6 6.1 - 8.1 g/dL   Albumin 3.5 (L) 3.6 - 5.1 g/dL   Globulin 3.1 1.9 - 3.7 g/dL (calc)   AG Ratio 1.1 1.0 - 2.5 (calc)   Total Bilirubin 0.3 0.2 - 1.2 mg/dL   Alkaline phosphatase (APISO) 119 37 - 153 U/L   AST 16 10 - 35 U/L   ALT 15 6 - 29 U/L  Microalbumin / creatinine urine ratio     Status: None   Collection Time: 05/19/20  8:20 AM  Result Value Ref Range   Creatinine, Urine 94 20 - 275 mg/dL   Microalb, Ur <1.7  mg/dL    Comment: Reference Range Not  established    Microalb Creat Ratio NOTE <30 mcg/mg creat    Comment: NOTE: The urine albumin value is less than  0.2 mg/dL therefore we are unable to calculate  excretion and/or creatinine ratio. . The ADA defines abnormalities in albumin excretion as follows: Marland Kitchen Category         Result (mcg/mg creatinine) . Normal                    <30 Microalbuminuria         30-299  Clinical albuminuria   > OR = 300 . The ADA recommends that at least two of three specimens collected within a 3-6 month period be abnormal before considering a patient to be within a diagnostic category.   Lipid panel     Status: Abnormal   Collection Time: 05/19/20  8:20 AM  Result Value Ref Range   Cholesterol 198 <200 mg/dL   HDL 50 > OR = 50 mg/dL   Triglycerides 86 <161 mg/dL   LDL Cholesterol (Calc) 129 (H) mg/dL (calc)    Comment: Reference range: <100 . Desirable range <100 mg/dL for primary prevention;   <70 mg/dL for patients with CHD or diabetic patients  with > or = 2 CHD risk factors. Marland Kitchen LDL-C is now calculated using the Martin-Hopkins  calculation, which is a validated novel method providing  better accuracy than the Friedewald equation in the  estimation of LDL-C.  Horald Pollen et al. Lenox Ahr. 0960;454(09): 2061-2068  (http://education.[facility].com/faq/FAQ164)    Total CHOL/HDL Ratio 4.0 <5.0 (calc)   Non-HDL Cholesterol (Calc) 148 (H) <130 mg/dL (calc)    Comment: For patients with diabetes plus 1 major ASCVD risk  factor, treating to a non-HDL-C goal of <100 mg/dL  (LDL-C of <81 mg/dL) is considered a therapeutic  option.   TSH     Status: None   Collection Time: 05/19/20  8:20 AM  Result Value Ref Range   TSH 4.08 mIU/L    Comment:           Reference Range .           > or = 20 Years  0.40-4.50 .                Pregnancy Ranges           First trimester    0.26-2.66           Second trimester   0.55-2.73           Third trimester    0.43-2.91   T4, free     Status: None    Collection Time: 05/19/20  8:20 AM  Result Value Ref Range   Free T4 0.9 0.8 - 1.8 ng/dL  VITAMIN D 25 Hydroxy (Vit-D Deficiency, Fractures)     Status: Abnormal   Collection Time: 05/19/20  8:20 AM  Result Value Ref Range   Vit D, 25-Hydroxy 133 (H) 30 - 100 ng/mL    Comment: Vitamin D Status         25-OH Vitamin D: . Deficiency:                    <20 ng/mL Insufficiency:             20 - 29 ng/mL Optimal:                 > or = 30 ng/mL . For 25-OH  Vitamin D testing on patients on  D2-supplementation and patients for whom quantitation  of D2 and D3 fractions is required, the [facility](TM) 25-OH VIT D, (D2,D3), LC/MS/MS is recommended: order  code [id number] (patients >4688yrs). See Note 1 . Note 1 . For additional information, please refer to  http://education.[facility].com/faq/FAQ199  (This link is being provided for informational/ educational purposes only.)   HgB A1c     Status: Abnormal   Collection Time: 05/24/20 10:57 AM  Result Value Ref Range   Hemoglobin A1C 8.8 (A) 4.0 - 5.6 %   HbA1c POC (<> result, manual entry)     HbA1c, POC (prediabetic range)     HbA1c, POC (controlled diabetic range)       Assessment & Plan:   1. Uncontrolled type 2 diabetes mellitus with complication, without long-term current use of insulin (HCC)  -She has chronically uncontrolled type 2 diabetes of unknown duration.    She is accompanied by nursing home staff.  Review of her logs showing significantly above target glycemic profile both fasting and postprandial.  She has access for sweetened beverages.  She did not document any hypoglycemia.  Her recent A1c was 8.8% in July 2021.   - She has no reported gross complications from diabetes however patient remains at a high risk for more acute and chronic complications of diabetes which include CAD, CVA, CKD, retinopathy, and neuropathy.  -  She does not understand the high risk she has, these are all discussed with her care  manager from the group home.  - I have counseled the patient on diet management by adopting a carbohydrate restricted/protein rich diet.  - she  admits there is a room for improvement in her diet and drink choices. -  Suggestion is made for her to avoid simple carbohydrates  from her diet including Cakes, Sweet Desserts / Pastries, Ice Cream, Soda (diet and regular), Sweet Tea, Candies, Chips, Cookies, Sweet Pastries,  Store Bought Juices, Alcohol in Excess of  1-2 drinks a day, Artificial Sweeteners, Coffee Creamer, and "Sugar-free" Products. This will help patient to have stable blood glucose profile and potentially avoid unintended weight gain.   - I encouraged the patient and her aide to switch her diet to  unprocessed or minimally processed complex starch and increased protein intake (animal or plant source), fruits, and vegetables.  - Patient is advised to stick to a routine mealtimes to eat 3 meals  a day and avoid unnecessary snacks ( to snack only to correct hypoglycemia).  - The patient will be scheduled with Norm SaltPenny Crumpton, RDN, CDE for individualized DM education.  - I have approached patient with the following individualized plan to manage diabetes and patient agrees:   -Reportedly, she did not change her dietary habits consuming a large quantities of sweetened beverages. -She will continue to need intensive treatment with basal/bolus insulin in order for her to maintain control of diabetes.   -For safety reasons, I advised to keep Lantus the same at 80 units nightly, however increase NovoLog to 20-26  units 3 times daily AC for pre-meal blood glucose readings above 90 mg/dL, associated with strict monitoring of blood glucose 4 times a day-daily before meals and at bedtime.   -She is advised to continue Metformin 500 mg p.o. twice daily, glipizide 5 mg XL p.o. daily at breakfast.    -Patient's care taker is encouraged to call clinic for blood glucose levels less than 70 or above 300  mg /dl.   - She is not  suitable candidate for SGLT2 inhibitors nor  therapy. - Patient specific target  A1c;  LDL, HDL, Triglycerides,  were discussed in detail.  2) BP/HT : Her blood pressure is controlled at 128/64.  She is advised to continue her current blood pressure medications including ramipril 5 mg p.o. daily with breakfast.    3) Lipids/HPL: Her recent labs show uncontrolled LDL at 149.I discussed and increase her atorvastatin 40 mg p.o. nightly.      4) vitamin D deficiency: She is s/p vitamin D replacement.  Her current 25 hydroxy vitamin D level is high at 133.  Her vitamin D supplements will be suspended until next measurement.      5)  Weight/Diet: she continues to gain weight, current BMI 40.  This clearly is complicating her diabetes care.  She is a candidate for modest weight loss.  She cannot exercise optimally.     exercise, and detailed carbohydrates information provided.  6) Chronic Care/Health Maintenance:  -Patient is on ACEI/ARB and Statin medications and encouraged to continue to follow up with Ophthalmology, Podiatrist at least yearly or according to recommendations, and advised to   stay away from smoking. I have recommended yearly flu vaccine and pneumonia vaccination at least every 5 years;  60 minutes of walking 4 days a week is recommended for her , and  sleep for at least 7 hours a day. - Patient to bring meter and  blood glucose logs during her next visit.   - I advised patient to maintain close follow up with Avon Gully, MD for primary care needs.   - Time spent on this patient care encounter:  35 min, of which > 50% was spent in  counseling and the rest reviewing her blood glucose logs , discussing her hypoglycemia and hyperglycemia episodes, reviewing her current and  previous labs / studies  ( including abstraction from other facilities) and medications  doses and developing a  long term treatment plan and documenting her care.   Please refer to  Patient Instructions for Blood Glucose Monitoring and Insulin/Medications Dosing Guide"  in media tab for additional information. Please  also refer to " Patient Self Inventory" in the Media  tab for reviewed elements of pertinent patient history.  Janitza Rohrig participated in the discussions, expressed understanding, and voiced agreement with the above plans.  All questions were answered to her satisfaction. she is encouraged to contact clinic should she have any questions or concerns prior to her return visit.     Follow up plan: - Return for Bring Meter and Logs- A1c in Office, Keep Reg. Appt. with Pre-visit Labs.  Marquis Lunch, MD Phone: [phone]  Fax: [phone]   08/09/2020, 3:19 PM

## 2020-08-09 NOTE — Patient Instructions (Signed)

## 2020-09-14 ENCOUNTER — Other Ambulatory Visit: Payer: Self-pay | Admitting: "Endocrinology

## 2020-09-21 ENCOUNTER — Other Ambulatory Visit: Payer: Self-pay

## 2020-09-21 ENCOUNTER — Ambulatory Visit (INDEPENDENT_AMBULATORY_CARE_PROVIDER_SITE_OTHER): Payer: Medicare Other | Admitting: "Endocrinology

## 2020-09-21 ENCOUNTER — Encounter: Payer: Self-pay | Admitting: "Endocrinology

## 2020-09-21 VITALS — BP 138/74 | HR 104 | Ht 60.0 in | Wt 211.6 lb

## 2020-09-21 DIAGNOSIS — IMO0002 Reserved for concepts with insufficient information to code with codable children: Secondary | ICD-10-CM

## 2020-09-21 DIAGNOSIS — E118 Type 2 diabetes mellitus with unspecified complications: Secondary | ICD-10-CM

## 2020-09-21 DIAGNOSIS — E1165 Type 2 diabetes mellitus with hyperglycemia: Secondary | ICD-10-CM | POA: Diagnosis not present

## 2020-09-21 LAB — POCT GLYCOSYLATED HEMOGLOBIN (HGB A1C): Hemoglobin A1C: 10.3 % — AB (ref 4.0–5.6)

## 2020-09-21 MED ORDER — HUMULIN R U-500 KWIKPEN 500 UNIT/ML ~~LOC~~ SOPN: 50.0000 [IU] | PEN_INJECTOR | Freq: Three times a day (TID) | SUBCUTANEOUS | 1 refills | Status: DC

## 2020-09-21 NOTE — Patient Instructions (Signed)

## 2020-09-21 NOTE — Progress Notes (Signed)
09/21/2020  Endocrinology follow-up note   Subjective:    Patient ID: Ashley Blanchard, female    DOB: 14-Mar-1966.  She is being seen in follow-up for history of uncontrolled type 2 diabetes, hyperlipidemia, hypertension.  She is accompanied by nursing staff who is not immersed in her medical condition. PMD:   Avon GullyFanta, Tesfaye, MD  Past Medical History:  Diagnosis Date  . Chronic anemia   . Diabetes mellitus, type II (HCC)   . Hearing loss   . Hyperlipidemia   . Hypertension   . Mild mental retardation    Past Surgical History:  Procedure Laterality Date  . CATARACT EXTRACTION    . RETINAL DETACHMENT SURGERY     Social History   Socioeconomic History  . Marital status: Single    Spouse name: Not on file  . Number of children: Not on file  . Years of education: Not on file  . Highest education level: Not on file  Occupational History  . Not on file  Tobacco Use  . Smoking status: Never Smoker  . Smokeless tobacco: Never Used  Vaping Use  . Vaping Use: Never used  Substance and Sexual Activity  . Alcohol use: No  . Drug use: No  . Sexual activity: Never    Birth control/protection: Post-menopausal  Other Topics Concern  . Not on file  Social History Narrative  . Not on file   Social Determinants of Health   Financial Resource Strain:   . Difficulty of Paying Living Expenses: Not on file  Food Insecurity:   . Worried About Programme researcher, broadcasting/film/videounning Out of Food in the Last Year: Not on file  . Ran Out of Food in the Last Year: Not on file  Transportation Needs:   . Lack of Transportation (Medical): Not on file  . Lack of Transportation (Non-Medical): Not on file  Physical Activity:   . Days of Exercise per Week: Not on file  . Minutes of Exercise per Session: Not on file  Stress:   . Feeling of Stress : Not on file  Social Connections:   . Frequency of Communication with Friends and Family: Not on file  . Frequency of Social Gatherings with Friends and Family: Not on file  .  Attends Religious Services: Not on file  . Active Member of Clubs or Organizations: Not on file  . Attends BankerClub or Organization Meetings: Not on file  . Marital Status: Not on file   Outpatient Encounter Medications as of 09/21/2020  Medication Sig  . aspirin EC 81 MG tablet Take 81 mg by mouth daily.  Marland Kitchen. atorvastatin (LIPITOR) 40 MG tablet Take 1 tablet (40 mg total) by mouth daily.  Marland Kitchen. Dextrose, Diabetic Use, (TRUEPLUS GLUCOSE PO) Take by mouth.  . diltiazem (DILACOR XR) 180 MG 24 hr capsule Take 180 mg by mouth daily.  Marland Kitchen. FORA LANCETS MISC USE TO TEST BLOOD SUGAR 3 TIMES DAILY.  Marland Kitchen. FORA V30A BLOOD GLUCOSE TEST test strip USE TO TEST BLOOD SUGAR 4 TIMES DAILY. BEFORE BREAKFAST, LUNCH, SUPPER AND AT BEDTIME.  . furosemide (LASIX) 20 MG tablet Take 20 mg by mouth.  Marland Kitchen. GLIPIZIDE XL 5 MG 24 hr tablet TAKE 1 TABLET BY MOUTH DAILY AT BREAKFAST  . insulin regular human CONCENTRATED (HUMULIN R U-500 KWIKPEN) 500 UNIT/ML kwikpen Inject 50 Units into the skin 3 (three) times daily with meals.  . metFORMIN (GLUCOPHAGE) 500 MG tablet Take by mouth 2 (two) times daily with a meal.  . ramipril (ALTACE) 5 MG capsule  Take 5 mg by mouth daily.  . risperiDONE (RISPERDAL) 0.5 MG tablet Take 0.5 mg by mouth at bedtime.  . sertraline (ZOLOFT) 100 MG tablet Take 100 mg by mouth daily.  . traZODone (DESYREL) 50 MG tablet Take 50 mg by mouth at bedtime.  . [DISCONTINUED] insulin aspart (NOVOLOG FLEXPEN) 100 UNIT/ML FlexPen Inject 20-26 Units into the skin 3 (three) times daily with meals.  . [DISCONTINUED] LANTUS SOLOSTAR 100 UNIT/ML Solostar Pen INJECT 80 UNITS SUBCUTANEOUSLY AT BEDTIME.   No facility-administered encounter medications on file as of 09/21/2020.   ALLERGIES: No Known Allergies VACCINATION STATUS:  There is no immunization history on file for this patient.  Diabetes She presents for her follow-up diabetic visit. She has type 2 diabetes mellitus. Onset time: She is not sure when she was  diagnosed with diabetes. Her disease course has been worsening. There are no hypoglycemic associated symptoms. Pertinent negatives for hypoglycemia include no confusion, headaches, pallor or seizures. Associated symptoms include polydipsia and polyuria. Pertinent negatives for diabetes include no chest pain, no fatigue and no polyphagia. There are no hypoglycemic complications. Symptoms are worsening. There are no diabetic complications. Risk factors for coronary artery disease include diabetes mellitus, hypertension and dyslipidemia. Current diabetic treatment includes oral agent (monotherapy). Compliance with diabetes treatment: She is in a group home due to failure to care for self. Her weight is increasing steadily. She is following a generally unhealthy diet. When asked about meal planning, she reported none. She has not had a previous visit with a dietitian. She rarely participates in exercise. Her home blood glucose trend is increasing steadily. Her breakfast blood glucose range is generally >200 mg/dl. Her lunch blood glucose range is generally >200 mg/dl. Her dinner blood glucose range is generally >200 mg/dl. Her bedtime blood glucose range is generally >200 mg/dl. Her overall blood glucose range is >200 mg/dl. (She is accompanied by nursing home staff.  Review of her logs showing worsening and significantly above target glycemic profile both fasting and postprandial.  She has unlimited access for sweetened beverages.  She did not document any hypoglycemia.  Her point-of-care A1c is 10.3% increasing from 8.8% in July 2021.  This is despite large dose of insulin and basal/bolus regimen. ) An ACE inhibitor/angiotensin II receptor blocker is being taken.  Hypertension This is a chronic problem. The current episode started more than 1 year ago. The problem is controlled. Pertinent negatives include no chest pain, headaches, palpitations or shortness of breath. Risk factors for coronary artery disease  include dyslipidemia, diabetes mellitus, obesity and sedentary lifestyle. Past treatments include ACE inhibitors.  Hyperlipidemia This is a chronic problem. The current episode started more than 1 year ago. Exacerbating diseases include diabetes and obesity. Pertinent negatives include no chest pain, myalgias or shortness of breath. Risk factors for coronary artery disease include diabetes mellitus, dyslipidemia, hypertension and obesity.    Review of Systems  Constitutional: Negative for fatigue and unexpected weight change.  HENT: Negative for trouble swallowing and voice change.   Eyes: Negative for visual disturbance.  Respiratory: Negative for cough, shortness of breath and wheezing.   Cardiovascular: Negative for chest pain, palpitations and leg swelling.  Gastrointestinal: Negative for diarrhea, nausea and vomiting.  Endocrine: Positive for polydipsia and polyuria. Negative for cold intolerance, heat intolerance and polyphagia.  Genitourinary: Positive for frequency. Negative for dysuria and flank pain.  Musculoskeletal: Negative for arthralgias and myalgias.  Skin: Negative for color change, pallor, rash and wound.  Neurological: Negative for seizures and headaches.  Psychiatric/Behavioral: Negative for confusion and suicidal ideas.    Objective:    BP 138/74   Pulse (!) 104   Ht 5' (1.524 m)   Wt 211 lb 9.6 oz (96 kg)   LMP  (LMP Unknown)   BMI 41.33 kg/m   Wt Readings from Last 3 Encounters:  09/21/20 211 lb 9.6 oz (96 kg)  08/09/20 208 lb 9.6 oz (94.6 kg)  05/24/20 207 lb 12.8 oz (94.3 kg)    Physical Exam HENT:     Head: Normocephalic and atraumatic.  Neck:     Thyroid: No thyromegaly.     Trachea: No tracheal deviation.  Cardiovascular:     Rate and Rhythm: Normal rate and regular rhythm.  Pulmonary:     Effort: Pulmonary effort is normal.  Abdominal:     Tenderness: There is no abdominal tenderness. There is no guarding.  Musculoskeletal:        General:  Normal range of motion.     Cervical back: Normal range of motion and neck supple.  Skin:    General: Skin is warm and dry.     Coloration: Skin is not pale.     Findings: No erythema or rash.  Neurological:     Mental Status: She is alert.     Cranial Nerves: No cranial nerve deficit.     Coordination: Coordination normal.     Comments: She is hard of hearing. She has documented intellectual disability, accompanied by her assistant.      Lipid Panel     Component Value Date/Time   CHOL 198 05/19/2020 0820   TRIG 86 05/19/2020 0820   HDL 50 05/19/2020 0820   CHOLHDL 4.0 05/19/2020 0820   LDLCALC 129 (H) 05/19/2020 0820   Recent Results (from the past 2160 hour(s))  HgB A1c     Status: Abnormal   Collection Time: 09/21/20  1:13 PM  Result Value Ref Range   Hemoglobin A1C 10.3 (A) 4.0 - 5.6 %   HbA1c POC (<> result, manual entry)     HbA1c, POC (prediabetic range)     HbA1c, POC (controlled diabetic range)       Assessment & Plan:   1. Uncontrolled type 2 diabetes mellitus with complication, without long-term current use of insulin (HCC)  -She has chronically uncontrolled type 2 diabetes of unknown duration.    She is accompanied by nursing home staff.  Review of her logs showing worsening and significantly above target glycemic profile both fasting and postprandial.  She has unlimited access for sweetened beverages.  She did not document any hypoglycemia.  Her point-of-care A1c is 10.3% increasing from 8.8% in July 2021.  This is despite large dose of insulin and basal/bolus regimen.  - She has no reported gross complications from diabetes however patient remains at a high risk for more acute and chronic complications of diabetes which include CAD, CVA, CKD, retinopathy, and neuropathy.  -  She does not understand the high risk she has, these are all discussed with her care manager from the group home.  - I have counseled the patient on diet management by adopting a  carbohydrate restricted/protein rich diet.   -  Suggestion is made for her to avoid simple carbohydrates  from her diet including Cakes, Sweet Desserts / Pastries, Ice Cream, Soda (diet and regular), Sweet Tea, Candies, Chips, Cookies, Sweet Pastries,  Store Bought Juices, Alcohol in Excess of  1-2 drinks a day, Artificial Sweeteners, Coffee Creamer, and "Sugar-free" Products. This  will help patient to have stable blood glucose profile and potentially avoid unintended weight gain.  - I encouraged the patient and her aide to switch her diet to  unprocessed or minimally processed complex starch and increased protein intake (animal or plant source), fruits, and vegetables.  - Patient is advised to stick to a routine mealtimes to eat 3 meals  a day and avoid unnecessary snacks ( to snack only to correct hypoglycemia).  - The patient will be scheduled with Norm Salt, RDN, CDE for individualized DM education.  - I have approached patient with the following individualized plan to manage diabetes and patient agrees:   -Her and limited access to sweetened beverages is making control of diabetes nearly impossible.   -She will continue to need intensive treatment with basal/bolus insulin in order for her to maintain control of diabetes.   -I discussed and switched her insulin to insulin Humulin  R U500.  She will be switched to U500 50 units 3 times daily AC for Premeal blood glucose readings above 90 mg/day, associated with strict monitoring of blood glucose 4 times a day-daily before meals and at bedtime.   -She is advised to discontinue Lantus and NovoLog. -She is advised to continue Metformin 500 mg p.o. twice daily, glipizide 5 mg XL p.o. daily at breakfast.    -Patient's care taker is encouraged to call clinic for blood glucose levels less than 70 or above 300 mg /dl.   - She is not suitable candidate for SGLT2 inhibitors nor  therapy. - Patient specific target  A1c;  LDL, HDL, Triglycerides,   were discussed in detail.  2) BP/HT : Her blood pressure is controlled to target.   She is advised to continue her current blood pressure medications including ramipril 5 mg p.o. daily with breakfast.    3) Lipids/HPL: Her recent labs show uncontrolled LDL at 149.I discussed and increase her atorvastatin 40 mg p.o. nightly.       4) vitamin D deficiency: She is s/p vitamin D replacement.  Her current 25 hydroxy vitamin D level is high at 133.  Her vitamin D supplements will be suspended until next measurement.      5)  Weight/Diet: she continues to gain weight, current BMI 41.3.  This clearly is complicating her diabetes care.  She is a candidate for modest weight loss.  She cannot exercise optimally.     exercise, and detailed carbohydrates information provided.  6) Chronic Care/Health Maintenance:  -Patient is on ACEI/ARB and Statin medications and encouraged to continue to follow up with Ophthalmology, Podiatrist at least yearly or according to recommendations, and advised to   stay away from smoking. I have recommended yearly flu vaccine and pneumonia vaccination at least every 5 years;  60 minutes of walking 4 days a week is recommended for her , and  sleep for at least 7 hours a day. - Patient to bring meter and  blood glucose logs during her next visit.   - I advised patient to maintain close follow up with Avon Gully, MD for primary care needs.   - Time spent on this patient care encounter:  45 min, of which > 50% was spent in  counseling and the rest reviewing her blood glucose logs , discussing her hypoglycemia and hyperglycemia episodes, reviewing her current and  previous labs / studies  ( including abstraction from other facilities) and medications  doses and developing a  long term treatment plan and documenting her care.  Please refer to Patient Instructions for Blood Glucose Monitoring and Insulin/Medications Dosing Guide"  in media tab for additional information. Please   also refer to " Patient Self Inventory" in the Media  tab for reviewed elements of pertinent patient history.  Ashley Blanchard participated in the discussions, expressed understanding, and voiced agreement with the above plans.  All questions were answered to her satisfaction. she is encouraged to contact clinic should she have any questions or concerns prior to her return visit.    Follow up plan: - Return in about 2 weeks (around 10/05/2020) for F/U with Meter and Logs Only - no Labs.  Marquis Lunch, MD Phone: [phone]  Fax: [phone]   09/21/2020, 2:52 PM

## 2020-09-27 ENCOUNTER — Ambulatory Visit: Payer: Medicare Other | Admitting: "Endocrinology

## 2020-09-29 ENCOUNTER — Ambulatory Visit: Payer: Medicare Other | Admitting: "Endocrinology

## 2020-10-05 ENCOUNTER — Ambulatory Visit (INDEPENDENT_AMBULATORY_CARE_PROVIDER_SITE_OTHER): Payer: Medicare Other | Admitting: "Endocrinology

## 2020-10-05 ENCOUNTER — Encounter: Payer: Self-pay | Admitting: "Endocrinology

## 2020-10-05 ENCOUNTER — Other Ambulatory Visit: Payer: Self-pay

## 2020-10-05 VITALS — BP 122/68 | HR 80 | Ht 60.0 in | Wt 214.0 lb

## 2020-10-05 DIAGNOSIS — I10 Essential (primary) hypertension: Secondary | ICD-10-CM | POA: Diagnosis not present

## 2020-10-05 DIAGNOSIS — E118 Type 2 diabetes mellitus with unspecified complications: Secondary | ICD-10-CM | POA: Diagnosis not present

## 2020-10-05 DIAGNOSIS — IMO0002 Reserved for concepts with insufficient information to code with codable children: Secondary | ICD-10-CM

## 2020-10-05 DIAGNOSIS — E1165 Type 2 diabetes mellitus with hyperglycemia: Secondary | ICD-10-CM | POA: Diagnosis not present

## 2020-10-05 DIAGNOSIS — E782 Mixed hyperlipidemia: Secondary | ICD-10-CM | POA: Diagnosis not present

## 2020-10-05 MED ORDER — HUMULIN R U-500 KWIKPEN 500 UNIT/ML ~~LOC~~ SOPN: 60.0000 [IU] | PEN_INJECTOR | Freq: Three times a day (TID) | SUBCUTANEOUS | 1 refills | Status: DC

## 2020-10-05 NOTE — Patient Instructions (Signed)

## 2020-10-05 NOTE — Progress Notes (Signed)
10/05/2020  Endocrinology follow-up note   Subjective:    Patient ID: Ashley Blanchard, female    DOB: July 21, 1966.  She is being seen in follow-up for history of uncontrolled type 2 diabetes, hyperlipidemia, hypertension.  She is accompanied by nursing staff who is not immersed in her medical condition. PMD:   Avon Gully, MD  Past Medical History:  Diagnosis Date  . Chronic anemia   . Diabetes mellitus, type II (HCC)   . Hearing loss   . Hyperlipidemia   . Hypertension   . Mild mental retardation    Past Surgical History:  Procedure Laterality Date  . CATARACT EXTRACTION    . RETINAL DETACHMENT SURGERY     Social History   Socioeconomic History  . Marital status: Single    Spouse name: Not on file  . Number of children: Not on file  . Years of education: Not on file  . Highest education level: Not on file  Occupational History  . Not on file  Tobacco Use  . Smoking status: Never Smoker  . Smokeless tobacco: Never Used  Vaping Use  . Vaping Use: Never used  Substance and Sexual Activity  . Alcohol use: No  . Drug use: No  . Sexual activity: Never    Birth control/protection: Post-menopausal  Other Topics Concern  . Not on file  Social History Narrative  . Not on file   Social Determinants of Health   Financial Resource Strain:   . Difficulty of Paying Living Expenses: Not on file  Food Insecurity:   . Worried About Programme researcher, broadcasting/film/video in the Last Year: Not on file  . Ran Out of Food in the Last Year: Not on file  Transportation Needs:   . Lack of Transportation (Medical): Not on file  . Lack of Transportation (Non-Medical): Not on file  Physical Activity:   . Days of Exercise per Week: Not on file  . Minutes of Exercise per Session: Not on file  Stress:   . Feeling of Stress : Not on file  Social Connections:   . Frequency of Communication with Friends and Family: Not on file  . Frequency of Social Gatherings with Friends and Family: Not on file  .  Attends Religious Services: Not on file  . Active Member of Clubs or Organizations: Not on file  . Attends Banker Meetings: Not on file  . Marital Status: Not on file   Outpatient Encounter Medications as of 10/05/2020  Medication Sig  . aspirin EC 81 MG tablet Take 81 mg by mouth daily.  Marland Kitchen atorvastatin (LIPITOR) 40 MG tablet Take 1 tablet (40 mg total) by mouth daily.  Marland Kitchen Dextrose, Diabetic Use, (TRUEPLUS GLUCOSE PO) Take by mouth.  . diltiazem (DILACOR XR) 180 MG 24 hr capsule Take 180 mg by mouth daily.  Marland Kitchen FORA LANCETS MISC USE TO TEST BLOOD SUGAR 3 TIMES DAILY.  Marland Kitchen FORA V30A BLOOD GLUCOSE TEST test strip USE TO TEST BLOOD SUGAR 4 TIMES DAILY. BEFORE BREAKFAST, LUNCH, SUPPER AND AT BEDTIME.  . furosemide (LASIX) 20 MG tablet Take 20 mg by mouth.  Marland Kitchen GLIPIZIDE XL 5 MG 24 hr tablet TAKE 1 TABLET BY MOUTH DAILY AT BREAKFAST  . insulin regular human CONCENTRATED (HUMULIN R U-500 KWIKPEN) 500 UNIT/ML kwikpen Inject 60 Units into the skin 3 (three) times daily with meals.  . metFORMIN (GLUCOPHAGE) 500 MG tablet Take by mouth 2 (two) times daily with a meal.  . ramipril (ALTACE) 5 MG capsule  Take 5 mg by mouth daily.  . risperiDONE (RISPERDAL) 0.5 MG tablet Take 0.5 mg by mouth at bedtime.  . sertraline (ZOLOFT) 100 MG tablet Take 100 mg by mouth daily.  . traZODone (DESYREL) 50 MG tablet Take 50 mg by mouth at bedtime.  . [DISCONTINUED] insulin regular human CONCENTRATED (HUMULIN R U-500 KWIKPEN) 500 UNIT/ML kwikpen Inject 50 Units into the skin 3 (three) times daily with meals.   No facility-administered encounter medications on file as of 10/05/2020.   ALLERGIES: No Known Allergies VACCINATION STATUS:  There is no immunization history on file for this patient.  Diabetes She presents for her follow-up diabetic visit. She has type 2 diabetes mellitus. Onset time: She is not sure when she was diagnosed with diabetes. Her disease course has been improving. There are no  hypoglycemic associated symptoms. Pertinent negatives for hypoglycemia include no confusion, headaches, pallor or seizures. Associated symptoms include polydipsia and polyuria. Pertinent negatives for diabetes include no chest pain, no fatigue and no polyphagia. There are no hypoglycemic complications. Symptoms are improving. There are no diabetic complications. Risk factors for coronary artery disease include diabetes mellitus, hypertension and dyslipidemia. Current diabetic treatment includes oral agent (monotherapy). Compliance with diabetes treatment: She is in a group home due to failure to care for self. Her weight is increasing steadily. She is following a generally unhealthy diet. When asked about meal planning, she reported none. She has not had a previous visit with a dietitian. She rarely participates in exercise. Her home blood glucose trend is decreasing steadily. Her breakfast blood glucose range is generally >200 mg/dl. Her lunch blood glucose range is generally >200 mg/dl. Her dinner blood glucose range is generally >200 mg/dl. Her bedtime blood glucose range is generally >200 mg/dl. Her overall blood glucose range is >200 mg/dl. (She is accompanied by nursing home staff.  Review of her logs showing slight improvement after she was switched to insulin U500, still registering significantly above target glycemic profile.   She did not document any hypoglycemia.  Her point-of-care A1c is 10.3% increasing from 8.8% in July 2021.  ) An ACE inhibitor/angiotensin II receptor blocker is being taken.  Hypertension This is a chronic problem. The current episode started more than 1 year ago. The problem is controlled. Pertinent negatives include no chest pain, headaches, palpitations or shortness of breath. Risk factors for coronary artery disease include dyslipidemia, diabetes mellitus, obesity and sedentary lifestyle. Past treatments include ACE inhibitors.  Hyperlipidemia This is a chronic problem. The  current episode started more than 1 year ago. Exacerbating diseases include diabetes and obesity. Pertinent negatives include no chest pain, myalgias or shortness of breath. Risk factors for coronary artery disease include diabetes mellitus, dyslipidemia, hypertension and obesity.    Review of Systems  Constitutional: Negative for fatigue and unexpected weight change.  HENT: Negative for trouble swallowing and voice change.   Eyes: Negative for visual disturbance.  Respiratory: Negative for cough, shortness of breath and wheezing.   Cardiovascular: Negative for chest pain, palpitations and leg swelling.  Gastrointestinal: Negative for diarrhea, nausea and vomiting.  Endocrine: Positive for polydipsia and polyuria. Negative for cold intolerance, heat intolerance and polyphagia.  Genitourinary: Positive for frequency. Negative for dysuria and flank pain.  Musculoskeletal: Negative for arthralgias and myalgias.  Skin: Negative for color change, pallor, rash and wound.  Neurological: Negative for seizures and headaches.  Psychiatric/Behavioral: Negative for confusion and suicidal ideas.    Objective:    BP 122/68   Pulse 80  Ht 5' (1.524 m)   Wt 214 lb (97.1 kg)   LMP  (LMP Unknown)   BMI 41.79 kg/m   Wt Readings from Last 3 Encounters:  10/05/20 214 lb (97.1 kg)  09/21/20 211 lb 9.6 oz (96 kg)  08/09/20 208 lb 9.6 oz (94.6 kg)    Physical Exam HENT:     Head: Normocephalic and atraumatic.  Neck:     Thyroid: No thyromegaly.     Trachea: No tracheal deviation.  Cardiovascular:     Rate and Rhythm: Normal rate and regular rhythm.  Pulmonary:     Effort: Pulmonary effort is normal.  Abdominal:     Tenderness: There is no abdominal tenderness. There is no guarding.  Musculoskeletal:        General: Normal range of motion.     Cervical back: Normal range of motion and neck supple.  Skin:    General: Skin is warm and dry.     Coloration: Skin is not pale.     Findings: No  erythema or rash.  Neurological:     Mental Status: She is alert.     Cranial Nerves: No cranial nerve deficit.     Coordination: Coordination normal.     Comments: She is hard of hearing. She has documented intellectual disability, accompanied by her assistant.      Lipid Panel     Component Value Date/Time   CHOL 198 05/19/2020 0820   TRIG 86 05/19/2020 0820   HDL 50 05/19/2020 0820   CHOLHDL 4.0 05/19/2020 0820   LDLCALC 129 (H) 05/19/2020 0820   Recent Results (from the past 2160 hour(s))  HgB A1c     Status: Abnormal   Collection Time: 09/21/20  1:13 PM  Result Value Ref Range   Hemoglobin A1C 10.3 (A) 4.0 - 5.6 %   HbA1c POC (<> result, manual entry)     HbA1c, POC (prediabetic range)     HbA1c, POC (controlled diabetic range)       Assessment & Plan:   1. Uncontrolled type 2 diabetes mellitus with complication, without long-term current use of insulin (HCC)  -She has chronically uncontrolled type 2 diabetes of unknown duration.    She is accompanied by nursing home staff.  Review of her logs showing slight improvement after she was switched to insulin U500, still registering significantly above target glycemic profile.   She did not document any hypoglycemia.  Her point-of-care A1c is 10.3% increasing from 8.8% in July 2021.    - She has no reported gross complications from diabetes however patient remains at a high risk for more acute and chronic complications of diabetes which include CAD, CVA, CKD, retinopathy, and neuropathy.  -  She does not understand the high risk she has, these are all discussed with her care manager from the group home.  - I have counseled the patient on diet management by adopting a carbohydrate restricted/protein rich diet.   -  Suggestion is made for her to avoid simple carbohydrates  from her diet including Cakes, Sweet Desserts / Pastries, Ice Cream, Soda (diet and regular), Sweet Tea, Candies, Chips, Cookies, Sweet Pastries,  Store  Bought Juices, Alcohol in Excess of  1-2 drinks a day, Artificial Sweeteners, Coffee Creamer, and "Sugar-free" Products. This will help patient to have stable blood glucose profile and potentially avoid unintended weight gain.   - I encouraged the patient and her aide to switch her diet to  unprocessed or minimally processed complex starch and  increased protein intake (animal or plant source), fruits, and vegetables.  - Patient is advised to stick to a routine mealtimes to eat 3 meals  a day and avoid unnecessary snacks ( to snack only to correct hypoglycemia).  - The patient will be scheduled with Norm Salt, RDN, CDE for individualized DM education.  - I have approached patient with the following individualized plan to manage diabetes and patient agrees:   -Her unlimited access to sweetened beverages is making control of diabetes nearly impossible.   -She will continue to need intensive treatment with higher dose of insulin in order for her to achieve and maintain control of diabetes to target.   -I discussed with the nursing home staff to increase her U500  To 60 units 3 times daily AC for Premeal blood glucose readings above 90 mg/day, associated with strict monitoring of blood glucose 4 times a day-daily before meals and at bedtime.    -She is advised to continue Metformin 500 mg p.o. twice daily, glipizide 5 mg XL p.o. daily at breakfast.    -Patient's care taker is encouraged to call clinic for blood glucose levels less than 70 or above 300 mg /dl.   - She is not suitable candidate for SGLT2 inhibitors nor  therapy. - Patient specific target  A1c;  LDL, HDL, Triglycerides,  were discussed in detail.  2) BP/HT : Her blood pressure is controlled to target..   She is advised to continue her current blood pressure medications including ramipril 5 mg p.o. daily with breakfast.    3) Lipids/HPL: Her recent labs show uncontrolled LDL at 149.I discussed and increase her atorvastatin 40  mg p.o. nightly.       4) vitamin D deficiency: She is s/p vitamin D replacement.  Her current 25 hydroxy vitamin D level is high at 133.  Her vitamin D supplements will be suspended until next measurement.      5)  Weight/Diet: she continues to gain weight, current BMI 41.3.  This clearly is complicating her diabetes care.  She is a candidate for modest weight loss.  She cannot exercise optimally.     exercise, and detailed carbohydrates information provided.  6) Chronic Care/Health Maintenance:  -Patient is on ACEI/ARB and Statin medications and encouraged to continue to follow up with Ophthalmology, Podiatrist at least yearly or according to recommendations, and advised to   stay away from smoking. I have recommended yearly flu vaccine and pneumonia vaccination at least every 5 years;  60 minutes of walking 4 days a week is recommended for her , and  sleep for at least 7 hours a day. - Patient to bring meter and  blood glucose logs during her next visit.   - I advised patient to maintain close follow up with Avon Gully, MD for primary care needs.   - Time spent on this patient care encounter:  35 min, of which > 50% was spent in  counseling and the rest reviewing her blood glucose logs , discussing her hypoglycemia and hyperglycemia episodes, reviewing her current and  previous labs / studies  ( including abstraction from other facilities) and medications  doses and developing a  long term treatment plan and documenting her care.   Please refer to Patient Instructions for Blood Glucose Monitoring and Insulin/Medications Dosing Guide"  in media tab for additional information. Please  also refer to " Patient Self Inventory" in the Media  tab for reviewed elements of pertinent patient history.  Ashley Blanchard participated  in the discussions, expressed understanding, and voiced agreement with the above plans.  All questions were answered to her satisfaction. she is encouraged to contact clinic  should she have any questions or concerns prior to her return visit.     Follow up plan: - Return in about 3 months (around 01/05/2021) for F/U with Pre-visit Labs, Meter, Logs, A1c here.Marquis Lunch, MD Phone: [phone]  Fax: [phone]   10/05/2020, 12:35 PM

## 2020-11-30 ENCOUNTER — Telehealth: Payer: Self-pay | Admitting: "Endocrinology

## 2020-12-01 NOTE — Telephone Encounter (Signed)
Received pt's last orders that [facility] had, discussed with Dr.Nida and clarified pt needs to Humulin r U-500 70 units at breakfast and lunch then 50 units with supper. Pharmacist with [facility] made aware, understanding voiced.

## 2020-12-01 NOTE — Telephone Encounter (Signed)
Pharmacist with [facility] is going to fax over last instructions for pt's Humulin R U-500

## 2020-12-01 NOTE — Telephone Encounter (Signed)
RX Called to clarify directions for the insulin. Please call back at [phone]

## 2020-12-05 ENCOUNTER — Other Ambulatory Visit: Payer: Self-pay | Admitting: "Endocrinology

## 2020-12-09 ENCOUNTER — Other Ambulatory Visit: Payer: Self-pay | Admitting: "Endocrinology

## 2020-12-26 ENCOUNTER — Other Ambulatory Visit (HOSPITAL_COMMUNITY): Payer: Self-pay | Admitting: Internal Medicine

## 2020-12-26 DIAGNOSIS — Z1231 Encounter for screening mammogram for malignant neoplasm of breast: Secondary | ICD-10-CM

## 2021-01-05 ENCOUNTER — Ambulatory Visit: Payer: Medicare Other | Admitting: Nurse Practitioner

## 2021-01-05 NOTE — Patient Instructions (Incomplete)

## 2021-01-06 LAB — T4, FREE: Free T4: 0.85 ng/dL (ref 0.82–1.77)

## 2021-01-06 LAB — COMPREHENSIVE METABOLIC PANEL
ALT: 15 IU/L (ref 0–32)
AST: 20 IU/L (ref 0–40)
Albumin/Globulin Ratio: 1.2 (ref 1.2–2.2)
Albumin: 3.6 g/dL — ABNORMAL LOW (ref 3.8–4.9)
Alkaline Phosphatase: 118 IU/L (ref 44–121)
BUN/Creatinine Ratio: 13 (ref 9–23)
BUN: 18 mg/dL (ref 6–24)
Bilirubin Total: 0.2 mg/dL (ref 0.0–1.2)
CO2: 22 mmol/L (ref 20–29)
Calcium: 9.8 mg/dL (ref 8.7–10.2)
Chloride: 98 mmol/L (ref 96–106)
Creatinine, Ser: 1.44 mg/dL — ABNORMAL HIGH (ref 0.57–1.00)
GFR calc Af Amer: 47 mL/min/{1.73_m2} — ABNORMAL LOW (ref >59)
GFR calc non Af Amer: 41 mL/min/{1.73_m2} — ABNORMAL LOW (ref >59)
Globulin, Total: 2.9 g/dL (ref 1.5–4.5)
Glucose: 338 mg/dL — ABNORMAL HIGH (ref 65–99)
Potassium: 4.8 mmol/L (ref 3.5–5.2)
Sodium: 137 mmol/L (ref 134–144)
Total Protein: 6.5 g/dL (ref 6.0–8.5)

## 2021-01-06 LAB — TSH: TSH: 4.08 u[IU]/mL (ref 0.450–4.500)

## 2021-01-11 ENCOUNTER — Ambulatory Visit (HOSPITAL_COMMUNITY)
Admission: RE | Admit: 2021-01-11 | Discharge: 2021-01-11 | Disposition: A | Discharge status: Home / Self Care | Payer: Medicare Other | Source: Ambulatory Visit | Attending: Internal Medicine | Admitting: Internal Medicine

## 2021-01-11 DIAGNOSIS — Z1231 Encounter for screening mammogram for malignant neoplasm of breast: Secondary | ICD-10-CM | POA: Insufficient documentation

## 2021-01-13 ENCOUNTER — Encounter: Payer: Self-pay | Admitting: Nurse Practitioner

## 2021-01-13 ENCOUNTER — Other Ambulatory Visit: Payer: Self-pay

## 2021-01-13 ENCOUNTER — Ambulatory Visit (INDEPENDENT_AMBULATORY_CARE_PROVIDER_SITE_OTHER): Payer: Medicare Other | Admitting: Nurse Practitioner

## 2021-01-13 VITALS — BP 123/78 | HR 106 | Ht 60.0 in | Wt 218.6 lb

## 2021-01-13 DIAGNOSIS — E1165 Type 2 diabetes mellitus with hyperglycemia: Secondary | ICD-10-CM | POA: Diagnosis not present

## 2021-01-13 DIAGNOSIS — IMO0002 Reserved for concepts with insufficient information to code with codable children: Secondary | ICD-10-CM

## 2021-01-13 DIAGNOSIS — E782 Mixed hyperlipidemia: Secondary | ICD-10-CM

## 2021-01-13 DIAGNOSIS — I10 Essential (primary) hypertension: Secondary | ICD-10-CM

## 2021-01-13 DIAGNOSIS — E559 Vitamin D deficiency, unspecified: Secondary | ICD-10-CM

## 2021-01-13 DIAGNOSIS — E118 Type 2 diabetes mellitus with unspecified complications: Secondary | ICD-10-CM

## 2021-01-13 LAB — POCT GLYCOSYLATED HEMOGLOBIN (HGB A1C): HbA1c, POC (controlled diabetic range): 7.8 % — AB (ref 0.0–7.0)

## 2021-01-13 MED ORDER — HUMULIN R U-500 KWIKPEN 500 UNIT/ML ~~LOC~~ SOPN: 40.0000 [IU] | PEN_INJECTOR | Freq: Three times a day (TID) | SUBCUTANEOUS | 2 refills | Status: DC

## 2021-01-13 NOTE — Progress Notes (Signed)
01/13/2021  Endocrinology follow-up note   Subjective:    Patient ID: Ashley Blanchard, female    DOB: [birth year].   She is being seen in follow-up for history of uncontrolled type 2 diabetes, hyperlipidemia, hypertension. She is accompanied by nursing staff who is not immersed in her medical condition.  PMD:   Rosita Fire, MD  Past Medical History:  Diagnosis Date  . Chronic anemia   . Diabetes mellitus, type II ([place])   . Hearing loss   . Hyperlipidemia   . Hypertension   . Mild mental retardation    Past Surgical History:  Procedure Laterality Date  . CATARACT EXTRACTION    . RETINAL DETACHMENT SURGERY     Social History   Socioeconomic History  . Marital status: Single    Spouse name: Not on file  . Number of children: Not on file  . Years of education: Not on file  . Highest education level: Not on file  Occupational History  . Not on file  Tobacco Use  . Smoking status: Never Smoker  . Smokeless tobacco: Never Used  Vaping Use  . Vaping Use: Never used  Substance and Sexual Activity  . Alcohol use: No  . Drug use: No  . Sexual activity: Never    Birth control/protection: Post-menopausal  Other Topics Concern  . Not on file  Social History Narrative  . Not on file   Social Determinants of Health   Financial Resource Strain: Not on file  Food Insecurity: Not on file  Transportation Needs: Not on file  Physical Activity: Not on file  Stress: Not on file  Social Connections: Not on file   Outpatient Encounter Medications as of 01/13/2021  Medication Sig  . ALLERGY RELIEF 10 MG tablet Take 10 mg by mouth daily.  Marland Kitchen aspirin EC 81 MG tablet Take 81 mg by mouth daily.  Marland Kitchen atorvastatin (LIPITOR) 40 MG tablet Take 1 tablet (40 mg total) by mouth daily.  Marland Kitchen Dextrose, Diabetic Use, (TRUEPLUS GLUCOSE PO) Take by mouth.  . diltiazem (CARDIZEM CD) 180 MG 24 hr capsule Take 180 mg by mouth daily.  Regino Schultze TEST test strip USE TO CHECK BLOOD SUGAR 4 TIMES DAILY  BEFORE BREAKFAST,LUNCH,SUPPER AND AT BEDTIME.  Marland Kitchen FEROSUL 325 (65 Fe) MG tablet Take by mouth.  Marland Kitchen FORA LANCETS MISC USE TO TEST BLOOD SUGAR 3 TIMES DAILY.  . furosemide (LASIX) 20 MG tablet Take 20 mg by mouth.  Marland Kitchen GLIPIZIDE XL 5 MG 24 hr tablet TAKE 1 TABLET BY MOUTH DAILY AT BREAKFAST.  . metFORMIN (GLUCOPHAGE) 500 MG tablet Take by mouth 2 (two) times daily with a meal.  . ramipril (ALTACE) 5 MG capsule Take 5 mg by mouth daily.  . risperiDONE (RISPERDAL) 1 MG tablet Take 1 mg by mouth at bedtime.  . sertraline (ZOLOFT) 100 MG tablet Take 100 mg by mouth daily.  . TRADJENTA 5 MG TABS tablet Take 5 mg by mouth daily.  . traZODone (DESYREL) 50 MG tablet Take 50 mg by mouth at bedtime.  . [DISCONTINUED] diltiazem (DILACOR XR) 180 MG 24 hr capsule Take 180 mg by mouth daily.  . [DISCONTINUED] HUMULIN R U-500 KWIKPEN 500 UNIT/ML kwikpen INJECT 50 UNITS SUBCUTANEOUSLY AT SUPPER. INJECT 70 UNITS SUBCUTANEOUSLY TWICE DAILY BEFORE BREAKFAST AND LUNCH.  Marland Kitchen insulin regular human CONCENTRATED (HUMULIN R U-500 KWIKPEN) 500 UNIT/ML kwikpen Inject 40-70 Units into the skin 3 (three) times daily with meals. Inject 70 units with breakfast and lunch and 40 units with  supper if glucose is above 90 and she is eating.  . [DISCONTINUED] risperiDONE (RISPERDAL) 0.5 MG tablet Take 0.5 mg by mouth at bedtime.   No facility-administered encounter medications on file as of 01/13/2021.   ALLERGIES: No Known Allergies VACCINATION STATUS:  There is no immunization history on file for this patient.  Diabetes She presents for her follow-up diabetic visit. She has type 2 diabetes mellitus. Onset time: She is not sure when she was diagnosed with diabetes. Her disease course has been improving. There are no hypoglycemic associated symptoms. Pertinent negatives for hypoglycemia include no confusion, headaches, pallor or seizures. Associated symptoms include polydipsia and polyuria. Pertinent negatives for diabetes include no  chest pain, no fatigue and no polyphagia. There are no hypoglycemic complications. Symptoms are stable. There are no diabetic complications. Risk factors for coronary artery disease include diabetes mellitus, hypertension, dyslipidemia, obesity and sedentary lifestyle. Current diabetic treatment includes intensive insulin program and oral agent (triple therapy) (she was also started on Tradjenta by her PCP). She is compliant with treatment most of the time (She is in a group home due to failure to care for self.). Her weight is increasing steadily. She is following a generally unhealthy diet. When asked about meal planning, she reported none. She has not had a previous visit with a dietitian. She rarely participates in exercise. Her home blood glucose trend is decreasing steadily. Her breakfast blood glucose range is generally 110-130 mg/dl. Her lunch blood glucose range is generally 140-180 mg/dl. Her dinner blood glucose range is generally 140-180 mg/dl. Her bedtime blood glucose range is generally 90-110 mg/dl. (She presents today, accompanied by her caretaker from the nursing home, with her logs showing greatly improved glycemic profile overall.  Her POCT A1c today is 7.8%, improving significantly from previous visit of 10.3%.  She does have some bedtime hypoglycemic documented.) An ACE inhibitor/angiotensin II receptor blocker is being taken. She sees a podiatrist.Eye exam is current.  Hypertension This is a chronic problem. The current episode started more than 1 year ago. The problem has been gradually improving since onset. The problem is controlled. Pertinent negatives include no chest pain, headaches, palpitations or shortness of breath. There are no associated agents to hypertension. Risk factors for coronary artery disease include dyslipidemia, diabetes mellitus, obesity and sedentary lifestyle. Past treatments include ACE inhibitors and diuretics. The current treatment provides moderate improvement.  There are no compliance problems.  Hypertensive end-organ damage includes kidney disease. Identifiable causes of hypertension include chronic renal disease.  Hyperlipidemia This is a chronic problem. The current episode started more than 1 year ago. The problem is uncontrolled. Recent lipid tests were reviewed and are high. Exacerbating diseases include chronic renal disease, diabetes and obesity. Pertinent negatives include no chest pain, myalgias or shortness of breath. Current antihyperlipidemic treatment includes statins. The current treatment provides mild improvement of lipids. Compliance problems include adherence to exercise.  Risk factors for coronary artery disease include diabetes mellitus, dyslipidemia, hypertension, obesity and a sedentary lifestyle.    Review of systems  Constitutional: + steadily increasing body weight,  current Body mass index is 42.69 kg/m. , no fatigue, no subjective hyperthermia, no subjective hypothermia Eyes: no blurry vision, no xerophthalmia ENT: no sore throat, no nodules palpated in throat, no dysphagia/odynophagia, no hoarseness Cardiovascular: no chest pain, no shortness of breath, no palpitations, no leg swelling Respiratory: no cough, no shortness of breath Gastrointestinal: no nausea/vomiting/diarrhea Musculoskeletal: no muscle/joint aches Skin: no rashes, no hyperemia Neurological: no tremors, no numbness, no tingling,  no dizziness Psychiatric: no depression, no anxiety, mild MR  Objective:    BP 123/78 (BP Location: Right Arm, Patient Position: Sitting)   Pulse (!) 106   Ht 5' (1.524 m)   Wt 218 lb 9.6 oz (99.2 kg)   LMP  (LMP Unknown)   BMI 42.69 kg/m   Wt Readings from Last 3 Encounters:  01/13/21 218 lb 9.6 oz (99.2 kg)  10/05/20 214 lb (97.1 kg)  09/21/20 211 lb 9.6 oz (96 kg)   BP Readings from Last 3 Encounters:  01/13/21 123/78  10/05/20 122/68  09/21/20 138/74     Physical Exam- Limited  Constitutional:  Body mass index  is 42.69 kg/m. , not in acute distress, mild MR, not conversational- nods and gestures appropriately Eyes:  EOMI, no exophthalmos Neck: Supple Cardiovascular: RRR, no murmers, rubs, or gallops, no edema Respiratory: Adequate breathing efforts, no crackles, rales, rhonchi, or wheezing Musculoskeletal: no gross deformities, strength intact in all four extremities, no gross restriction of joint movements Skin:  no rashes, no hyperemia Neurological: no tremor with outstretched hands    Lipid Panel     Component Value Date/Time   CHOL 198 05/19/2020 0820   TRIG 86 05/19/2020 0820   HDL 50 05/19/2020 0820   CHOLHDL 4.0 05/19/2020 0820   LDLCALC 129 (H) 05/19/2020 0820   Recent Results (from the past 2160 hour(s))  Comprehensive metabolic panel     Status: Abnormal   Collection Time: 01/05/21 10:07 AM  Result Value Ref Range   Glucose 338 (H) 65 - 99 mg/dL   BUN 18 6 - 24 mg/dL   Creatinine, Ser 1.44 (H) 0.57 - 1.00 mg/dL    Comment:                **Effective January 09, 2021 [facility] will begin**                  reporting the 2021 CKD-EPI creatinine equation that                  estimates kidney function without a race variable.    GFR calc non Af Amer 41 (L) >59 mL/min/1.73   GFR calc Af Amer 47 (L) >59 mL/min/1.73    Comment: **In accordance with recommendations from the NKF-ASN Task force,**   [facility] is in the process of updating its eGFR calculation to the   2021 CKD-EPI creatinine equation that estimates kidney function   without a race variable.    BUN/Creatinine Ratio 13 9 - 23   Sodium 137 134 - 144 mmol/L   Potassium 4.8 3.5 - 5.2 mmol/L   Chloride 98 96 - 106 mmol/L   CO2 22 20 - 29 mmol/L   Calcium 9.8 8.7 - 10.2 mg/dL   Total Protein 6.5 6.0 - 8.5 g/dL   Albumin 3.6 (L) 3.8 - 4.9 g/dL   Globulin, Total 2.9 1.5 - 4.5 g/dL   Albumin/Globulin Ratio 1.2 1.2 - 2.2   Bilirubin Total 0.2 0.0 - 1.2 mg/dL   Alkaline Phosphatase 118 44 - 121 IU/L   AST 20 0 - 40  IU/L   ALT 15 0 - 32 IU/L  TSH     Status: None   Collection Time: 01/05/21 10:07 AM  Result Value Ref Range   TSH 4.080 0.450 - 4.500 uIU/mL  T4, free     Status: None   Collection Time: 01/05/21 10:07 AM  Result Value Ref Range   Free T4 0.85 0.82 -  1.77 ng/dL  HgB A1c     Status: Abnormal   Collection Time: 01/13/21  9:53 AM  Result Value Ref Range   Hemoglobin A1C     HbA1c POC (<> result, manual entry)     HbA1c, POC (prediabetic range)     HbA1c, POC (controlled diabetic range) 7.8 (A) 0.0 - 7.0 %     Assessment & Plan:   1) Uncontrolled type 2 diabetes mellitus with complication, without long-term current use of insulin ([place])  -She has chronically uncontrolled type 2 diabetes of unknown duration.    She presents today, accompanied by her caretaker from the nursing home, with her logs showing greatly improved glycemic profile overall.  Her POCT A1c today is 7.8%, improving significantly from previous visit of 10.3%.  She does have some bedtime hypoglycemic documented.  - She has complications from diabetes including CKD however patient remains at a high risk for more acute and chronic complications of diabetes which include CAD, CVA, CKD, retinopathy, and neuropathy.   -  She does not understand the high risk she has, these are all discussed with her care manager from the group home.  - Nutritional counseling repeated at each appointment due to patients tendency to fall back in to old habits.  - The patient admits there is a room for improvement in their diet and drink choices. -  Suggestion is made for the patient to avoid simple carbohydrates from their diet including Cakes, Sweet Desserts / Pastries, Ice Cream, Soda (diet and regular), Sweet Tea, Candies, Chips, Cookies, Sweet Pastries,  Store Bought Juices, Alcohol in Excess of  1-2 drinks a day, Artificial Sweeteners, Coffee Creamer, and "Sugar-free" Products. This will help patient to have stable blood glucose profile  and potentially avoid unintended weight gain.   - I encouraged the patient to switch to  unprocessed or minimally processed complex starch and increased protein intake (animal or plant source), fruits, and vegetables.   - Patient is advised to stick to a routine mealtimes to eat 3 meals  a day and avoid unnecessary snacks ( to snack only to correct hypoglycemia).  - I have approached patient with the following individualized plan to manage diabetes and patient agrees:   -Her unlimited access to sweetened beverages is making control of diabetes nearly impossible. Number 1 priority in her case is to avoid hypoglycemia.  -She will continue to need intensive treatment with higher dose of insulin in order for her to achieve and maintain control of diabetes to target.   -She is advised to continue Humulin R- U500 at 70 units with breakfast and lunch and reduce her dinner dose to 40 units given her bedtime hypoglycemia, if glucose is above 90 and she is eating.  She is advised to continue Metformin 500 mg po twice daily with meals, continue Glipizide 5 mg XL daily with breakfast, and Tradjenta 5 mg po daily.   -She is encouraged to continue monitoring blood glucose 4 times daily, before meals and before bed, and to call the clinic if she has readings less than 70 or greater than 300 for 3 tests in a row.  - She is not suitable candidate for SGLT2 inhibitors.  - Patient specific target  A1c;  LDL, HDL, Triglycerides,  were discussed in detail.  2) BP/HTN :  Her blood pressure is controlled to target.   She is advised to continue her current blood pressure medications including Ramipril 5 mg p.o. daily with breakfast, and Lasix 20 mg po  daily.    3) Lipids/HPL:  Her most recent lipid panel from 05/19/20 shows uncontrolled LDL of 129.  She is advised to continue Lipitor 40 mg po daily at bedtime.  Side effects and precautions discussed with her.  Will recheck lipid panel prior to next visit.  4)  Vitamin D deficiency:  There is no recent vitamin d level to review.  She is not currently taking any vitamin D supplements.  Vitamin D level will be checked prior to next visit.  5)  Weight/Diet:  Her Body mass index is 42.69 kg/m.  This clearly is complicating her diabetes care.  She is a candidate for modest weight loss.  She cannot exercise optimally.   Exercise, and detailed carbohydrates information provided.  6) Chronic Care/Health Maintenance: -Patient is on ACEI/ARB and Statin medications and encouraged to continue to follow up with Ophthalmology, Podiatrist at least yearly or according to recommendations, and advised to   stay away from smoking. I have recommended yearly flu vaccine and pneumonia vaccination at least every 5 years;  60 minutes of walking 4 days a week is recommended for her , and  sleep for at least 7 hours a day.   - I advised patient to maintain close follow up with Rosita Fire, MD for primary care needs.   - Time spent on this patient care encounter:  40 min, of which > 50% was spent in  counseling and the rest reviewing her blood glucose logs , discussing her hypoglycemia and hyperglycemia episodes, reviewing her current and  previous labs / studies  ( including abstraction from other facilities) and medications  doses and developing a  long term treatment plan and documenting her care.   Please refer to Patient Instructions for Blood Glucose Monitoring and Insulin/Medications Dosing Guide"  in media tab for additional information. Please  also refer to " Patient Self Inventory" in the Media  tab for reviewed elements of pertinent patient history.  Mikal Deliz participated in the discussions, expressed understanding, and voiced agreement with the above plans.  All questions were answered to her satisfaction. she is encouraged to contact clinic should she have any questions or concerns prior to her return visit.   Follow up plan: - Return in about 4 months  (around 05/15/2021) for Diabetes follow up- A1c and urine micro in office, Previsit labs, Bring glucometer and logs.  Rayetta Pigg, [facility] [address] [place], [place] [address] Phone: [phone] Fax: [phone]  01/13/2021, 10:23 AM

## 2021-01-13 NOTE — Patient Instructions (Signed)

## 2021-02-08 ENCOUNTER — Telehealth: Payer: Self-pay

## 2021-02-08 DIAGNOSIS — IMO0002 Reserved for concepts with insufficient information to code with codable children: Secondary | ICD-10-CM

## 2021-02-08 DIAGNOSIS — E1165 Type 2 diabetes mellitus with hyperglycemia: Secondary | ICD-10-CM

## 2021-02-08 MED ORDER — HUMULIN R U-500 KWIKPEN 500 UNIT/ML ~~LOC~~ SOPN: 40.0000 [IU] | PEN_INJECTOR | Freq: Three times a day (TID) | SUBCUTANEOUS | 1 refills | Status: DC

## 2021-02-08 NOTE — Telephone Encounter (Signed)
Rx sent 

## 2021-02-08 NOTE — Telephone Encounter (Signed)
[facility] said that the patient is out of insulin - insulin regular human CONCENTRATED (HUMULIN R U-500 KWIKPEN) 500 UNIT/ML kwikpen Please send to [facility]

## 2021-02-10 MED ORDER — HUMULIN R U-500 KWIKPEN 500 UNIT/ML ~~LOC~~ SOPN: 40.0000 [IU] | PEN_INJECTOR | Freq: Three times a day (TID) | SUBCUTANEOUS | 1 refills | Status: DC

## 2021-02-10 NOTE — Telephone Encounter (Signed)
Rx sent 

## 2021-02-10 NOTE — Telephone Encounter (Signed)
Can you resend this? [facility] called and said its not showing they received this on their end.

## 2021-02-10 NOTE — Addendum Note (Signed)
Addended by: Derrell Lolling on: 02/10/2021 11:49 AM   Modules accepted: Orders

## 2021-02-22 ENCOUNTER — Other Ambulatory Visit: Payer: Self-pay | Admitting: Nurse Practitioner

## 2021-02-22 DIAGNOSIS — IMO0002 Reserved for concepts with insufficient information to code with codable children: Secondary | ICD-10-CM

## 2021-02-22 DIAGNOSIS — E1165 Type 2 diabetes mellitus with hyperglycemia: Secondary | ICD-10-CM

## 2021-03-24 ENCOUNTER — Other Ambulatory Visit: Payer: Self-pay | Admitting: "Endocrinology

## 2021-04-07 ENCOUNTER — Other Ambulatory Visit: Payer: Self-pay | Admitting: Nurse Practitioner

## 2021-04-07 DIAGNOSIS — E1165 Type 2 diabetes mellitus with hyperglycemia: Secondary | ICD-10-CM

## 2021-04-07 DIAGNOSIS — IMO0002 Reserved for concepts with insufficient information to code with codable children: Secondary | ICD-10-CM

## 2021-05-05 ENCOUNTER — Other Ambulatory Visit: Payer: Self-pay | Admitting: Nurse Practitioner

## 2021-05-05 DIAGNOSIS — E1165 Type 2 diabetes mellitus with hyperglycemia: Secondary | ICD-10-CM

## 2021-05-05 DIAGNOSIS — IMO0002 Reserved for concepts with insufficient information to code with codable children: Secondary | ICD-10-CM

## 2021-05-10 ENCOUNTER — Other Ambulatory Visit: Payer: Self-pay | Admitting: Nurse Practitioner

## 2021-05-12 LAB — COMPREHENSIVE METABOLIC PANEL
ALT: 18 IU/L (ref 0–32)
AST: 16 IU/L (ref 0–40)
Albumin/Globulin Ratio: 1.4 (ref 1.2–2.2)
Albumin: 3.8 g/dL (ref 3.8–4.9)
Alkaline Phosphatase: 125 IU/L — ABNORMAL HIGH (ref 44–121)
BUN/Creatinine Ratio: 12 (ref 9–23)
BUN: 17 mg/dL (ref 6–24)
Bilirubin Total: <0.2 mg/dL (ref 0.0–1.2)
CO2: 26 mmol/L (ref 20–29)
Calcium: 9.9 mg/dL (ref 8.7–10.2)
Chloride: 98 mmol/L (ref 96–106)
Creatinine, Ser: 1.39 mg/dL — ABNORMAL HIGH (ref 0.57–1.00)
Globulin, Total: 2.7 g/dL (ref 1.5–4.5)
Glucose: 128 mg/dL — ABNORMAL HIGH (ref 65–99)
Potassium: 4.7 mmol/L (ref 3.5–5.2)
Sodium: 140 mmol/L (ref 134–144)
Total Protein: 6.5 g/dL (ref 6.0–8.5)
eGFR: 45 mL/min/{1.73_m2} — ABNORMAL LOW (ref >59)

## 2021-05-12 LAB — LIPID PANEL
Chol/HDL Ratio: 3.7 ratio (ref 0.0–4.4)
Cholesterol, Total: 174 mg/dL (ref 100–199)
HDL: 47 mg/dL (ref >39)
LDL Chol Calc (NIH): 108 mg/dL — ABNORMAL HIGH (ref 0–99)
Triglycerides: 104 mg/dL (ref 0–149)
VLDL Cholesterol Cal: 19 mg/dL (ref 5–40)

## 2021-05-12 LAB — VITAMIN D 25 HYDROXY (VIT D DEFICIENCY, FRACTURES): Vit D, 25-Hydroxy: 28.6 ng/mL — ABNORMAL LOW (ref 30.0–100.0)

## 2021-05-17 ENCOUNTER — Ambulatory Visit (INDEPENDENT_AMBULATORY_CARE_PROVIDER_SITE_OTHER): Payer: Medicare Other | Admitting: Nurse Practitioner

## 2021-05-17 ENCOUNTER — Encounter: Payer: Self-pay | Admitting: Nurse Practitioner

## 2021-05-17 VITALS — BP 163/83 | HR 104 | Ht 60.0 in | Wt 217.0 lb

## 2021-05-17 DIAGNOSIS — I10 Essential (primary) hypertension: Secondary | ICD-10-CM

## 2021-05-17 DIAGNOSIS — E1165 Type 2 diabetes mellitus with hyperglycemia: Secondary | ICD-10-CM

## 2021-05-17 DIAGNOSIS — E559 Vitamin D deficiency, unspecified: Secondary | ICD-10-CM | POA: Diagnosis not present

## 2021-05-17 DIAGNOSIS — E118 Type 2 diabetes mellitus with unspecified complications: Secondary | ICD-10-CM

## 2021-05-17 DIAGNOSIS — E782 Mixed hyperlipidemia: Secondary | ICD-10-CM

## 2021-05-17 DIAGNOSIS — IMO0002 Reserved for concepts with insufficient information to code with codable children: Secondary | ICD-10-CM

## 2021-05-17 LAB — POCT GLYCOSYLATED HEMOGLOBIN (HGB A1C): Hemoglobin A1C: 7.7 % — AB (ref 4.0–5.6)

## 2021-05-17 NOTE — Patient Instructions (Signed)

## 2021-05-17 NOTE — Progress Notes (Signed)
05/17/2021  Endocrinology follow-up note   Subjective:    Patient ID: Ashley Blanchard, female    DOB: 04-16-1966.   She is being seen in follow-up for history of uncontrolled type 2 diabetes, hyperlipidemia, hypertension. She is accompanied by nursing staff who is not immersed in her medical condition.  PMD:   Rosita Fire, MD  Past Medical History:  Diagnosis Date   Chronic anemia    Diabetes mellitus, type II ([place])    Hearing loss    Hyperlipidemia    Hypertension    Mild mental retardation    Past Surgical History:  Procedure Laterality Date   CATARACT EXTRACTION     RETINAL DETACHMENT SURGERY     Social History   Socioeconomic History   Marital status: Single    Spouse name: Not on file   Number of children: Not on file   Years of education: Not on file   Highest education level: Not on file  Occupational History   Not on file  Tobacco Use   Smoking status: Never   Smokeless tobacco: Never  Vaping Use   Vaping Use: Never used  Substance and Sexual Activity   Alcohol use: No   Drug use: No   Sexual activity: Never    Birth control/protection: Post-menopausal  Other Topics Concern   Not on file  Social History Narrative   Not on file   Social Determinants of Health   Financial Resource Strain: Not on file  Food Insecurity: Not on file  Transportation Needs: Not on file  Physical Activity: Not on file  Stress: Not on file  Social Connections: Not on file   Outpatient Encounter Medications as of 05/17/2021  Medication Sig   ALLERGY RELIEF 10 MG tablet Take 10 mg by mouth daily.   aspirin EC 81 MG tablet Take 81 mg by mouth daily.   atorvastatin (LIPITOR) 40 MG tablet Take 1 tablet (40 mg total) by mouth daily.   Dextrose, Diabetic Use, (TRUEPLUS GLUCOSE PO) Take by mouth.   diltiazem (CARDIZEM CD) 180 MG 24 hr capsule Take 180 mg by mouth daily.   DROPSAFE SAFETY PEN NEEDLES 31G X 6 MM MISC USE TO INJECT INSULIN FOUR TIMES DAILY   EASYMAX TEST test  strip USE TO CHECK BLOOD SUGAR 4 TIMES DAILY BEFORE BREAKFAST,LUNCH,SUPPER AND AT BEDTIME.   FEROSUL 325 (65 Fe) MG tablet Take by mouth.   FORA LANCETS MISC USE TO TEST BLOOD SUGAR 3 TIMES DAILY.   furosemide (LASIX) 20 MG tablet Take 20 mg by mouth.   GLIPIZIDE XL 5 MG 24 hr tablet TAKE 1 TABLET BY MOUTH DAILY AT BREAKFAST.   insulin regular human CONCENTRATED (HUMULIN R U-500 KWIKPEN) 500 UNIT/ML kwikpen INJECT 70 UNITS SUBCUTANEOUSLY AT BREAKFAST & LUNCH: (HOLD IF BS<70: NOTIFY MD IF BS>400) INJECT 40 UNITS AT DINNER.   metFORMIN (GLUCOPHAGE) 500 MG tablet Take by mouth 2 (two) times daily with a meal.   ramipril (ALTACE) 5 MG capsule Take 5 mg by mouth daily.   risperiDONE (RISPERDAL) 1 MG tablet Take 1 mg by mouth at bedtime.   sertraline (ZOLOFT) 100 MG tablet Take 100 mg by mouth daily.   TRADJENTA 5 MG TABS tablet Take 5 mg by mouth daily.   traZODone (DESYREL) 50 MG tablet Take 50 mg by mouth at bedtime.   No facility-administered encounter medications on file as of 05/17/2021.   ALLERGIES: No Known Allergies VACCINATION STATUS:  There is no immunization history on file for this patient.  Diabetes She presents for her follow-up diabetic visit. She has type 2 diabetes mellitus. Onset time: She is not sure when she was diagnosed with diabetes. Her disease course has been stable. Hypoglycemia symptoms include nervousness/anxiousness, sweats and tremors. Pertinent negatives for hypoglycemia include no confusion, headaches, pallor or seizures. Associated symptoms include polyuria. Pertinent negatives for diabetes include no chest pain, no fatigue, no polydipsia and no polyphagia. There are no hypoglycemic complications. Symptoms are stable. There are no diabetic complications. Risk factors for coronary artery disease include diabetes mellitus, hypertension, dyslipidemia, obesity and sedentary lifestyle. Current diabetic treatment includes intensive insulin program and oral agent (triple  therapy). She is compliant with treatment most of the time (She is in a group home due to failure to care for self.). Her weight is stable. She is following a generally unhealthy diet. When asked about meal planning, she reported none. She has not had a previous visit with a dietitian. She rarely participates in exercise. Her home blood glucose trend is decreasing steadily. Her breakfast blood glucose range is generally 140-180 mg/dl. Her lunch blood glucose range is generally >200 mg/dl. Her dinner blood glucose range is generally 180-200 mg/dl. Her bedtime blood glucose range is generally 140-180 mg/dl. (She presents today, accompanied by her caretaker from the nursing home, with her logs from the facility showing fluctuating glycemic profile with near target fasting and slightly above target postprandial glycemic profile.  Her POCT A1c today 7.7%, essentially unchanged from previous visit of 7.8%.  She did have rare occurrence of hypoglycemia likely associated with meal timing or carb quantity at meals.) An ACE inhibitor/angiotensin II receptor blocker is being taken. She sees a podiatrist.Eye exam is current.  Hypertension This is a chronic problem. The current episode started more than 1 year ago. The problem has been gradually improving since onset. The problem is controlled. Associated symptoms include sweats. Pertinent negatives include no chest pain, headaches, palpitations or shortness of breath. There are no associated agents to hypertension. Risk factors for coronary artery disease include dyslipidemia, diabetes mellitus, obesity and sedentary lifestyle. Past treatments include ACE inhibitors and diuretics. The current treatment provides moderate improvement. There are no compliance problems.  Hypertensive end-organ damage includes kidney disease. Identifiable causes of hypertension include chronic renal disease.  Hyperlipidemia This is a chronic problem. The current episode started more than 1 year  ago. The problem is uncontrolled. Recent lipid tests were reviewed and are high. Exacerbating diseases include chronic renal disease, diabetes and obesity. Pertinent negatives include no chest pain, myalgias or shortness of breath. Current antihyperlipidemic treatment includes statins. The current treatment provides mild improvement of lipids. Compliance problems include adherence to exercise.  Risk factors for coronary artery disease include diabetes mellitus, dyslipidemia, hypertension, obesity and a sedentary lifestyle.   Review of systems  Constitutional: + stable body weight,  current Body mass index is 42.38 kg/m. , no fatigue, no subjective hyperthermia, no subjective hypothermia Eyes: no blurry vision, no xerophthalmia ENT: no sore throat, no nodules palpated in throat, no dysphagia/odynophagia, no hoarseness Cardiovascular: no chest pain, no shortness of breath, no palpitations, no leg swelling Respiratory: no cough, no shortness of breath Gastrointestinal: no nausea/vomiting/diarrhea Musculoskeletal: no muscle/joint aches Skin: no rashes, no hyperemia Neurological: no tremors, no numbness, no tingling, no dizziness Psychiatric: no depression, no anxiety, mild MR  Objective:    BP (!) 163/83   Pulse (!) 104   Ht 5' (1.524 m)   Wt 217 lb (98.4 kg)   LMP  (LMP Unknown)  BMI 42.38 kg/m   Wt Readings from Last 3 Encounters:  05/17/21 217 lb (98.4 kg)  01/13/21 218 lb 9.6 oz (99.2 kg)  10/05/20 214 lb (97.1 kg)   BP Readings from Last 3 Encounters:  05/17/21 (!) 163/83  01/13/21 123/78  10/05/20 122/68     Physical Exam- Limited  Constitutional:  Body mass index is 42.38 kg/m. , not in acute distress, mild MR, not conversational- nods and gestures appropriately Eyes:  EOMI, no exophthalmos Neck: Supple Cardiovascular: RRR, no murmurs, rubs, or gallops, no edema Respiratory: Adequate breathing efforts, no crackles, rales, rhonchi, or wheezing Musculoskeletal: no gross  deformities, strength intact in all four extremities, no gross restriction of joint movements Skin:  no rashes, no hyperemia Neurological: no tremor with outstretched hands   POCT ABI Results 05/17/21   Right ABI:  1.08      Left ABI:  1.05  Right leg systolic / diastolic: 295/28 mmHg Left leg systolic / diastolic: 413/24 mmHg  Arm systolic / diastolic: 401/02 mmHG  Detailed report will be scanned into patient chart.   Lipid Panel     Component Value Date/Time   CHOL 174 05/11/2021 0824   TRIG 104 05/11/2021 0824   HDL 47 05/11/2021 0824   CHOLHDL 3.7 05/11/2021 0824   CHOLHDL 4.0 05/19/2020 0820   LDLCALC 108 (H) 05/11/2021 0824   LDLCALC 129 (H) 05/19/2020 0820   Recent Results (from the past 2160 hour(s))  Comprehensive metabolic panel     Status: Abnormal   Collection Time: 05/11/21  8:24 AM  Result Value Ref Range   Glucose 128 (H) 65 - 99 mg/dL   BUN 17 6 - 24 mg/dL   Creatinine, Ser 1.39 (H) 0.57 - 1.00 mg/dL   eGFR 45 (L) >59 mL/min/1.73   BUN/Creatinine Ratio 12 9 - 23   Sodium 140 134 - 144 mmol/L   Potassium 4.7 3.5 - 5.2 mmol/L   Chloride 98 96 - 106 mmol/L   CO2 26 20 - 29 mmol/L   Calcium 9.9 8.7 - 10.2 mg/dL   Total Protein 6.5 6.0 - 8.5 g/dL   Albumin 3.8 3.8 - 4.9 g/dL   Globulin, Total 2.7 1.5 - 4.5 g/dL   Albumin/Globulin Ratio 1.4 1.2 - 2.2   Bilirubin Total <0.2 0.0 - 1.2 mg/dL   Alkaline Phosphatase 125 (H) 44 - 121 IU/L   AST 16 0 - 40 IU/L   ALT 18 0 - 32 IU/L  Lipid panel     Status: Abnormal   Collection Time: 05/11/21  8:24 AM  Result Value Ref Range   Cholesterol, Total 174 100 - 199 mg/dL   Triglycerides 104 0 - 149 mg/dL   HDL 47 >39 mg/dL   VLDL Cholesterol Cal 19 5 - 40 mg/dL   LDL Chol Calc (NIH) 108 (H) 0 - 99 mg/dL   Chol/HDL Ratio 3.7 0.0 - 4.4 ratio    Comment:                                   T. Chol/HDL Ratio                                             Men  Women  1/2 Avg.Risk  3.4     3.3                                   Avg.Risk  5.0    4.4                                2X Avg.Risk  9.6    7.1                                3X Avg.Risk 23.4   11.0   VITAMIN D 25 Hydroxy (Vit-D Deficiency, Fractures)     Status: Abnormal   Collection Time: 05/11/21  8:24 AM  Result Value Ref Range   Vit D, 25-Hydroxy 28.6 (L) 30.0 - 100.0 ng/mL    Comment: Vitamin D deficiency has been defined by the [place] practice guideline as a level of serum 25-OH vitamin D less than 20 ng/mL (1,2). The Endocrine Society went on to further define vitamin D insufficiency as a level between 21 and 29 ng/mL (2). 1. IOM (Institute of Medicine). 2010. Dietary reference    intakes for calcium and D. [place]: The    [employer]. 2. Holick MF, Binkley [place], Bischoff-Ferrari HA, et al.    Evaluation, treatment, and prevention of vitamin D    deficiency: an Endocrine Society clinical practice    guideline. JCEM. 2011 Jul; 96(7):1911-30.   HgB A1c     Status: Abnormal   Collection Time: 05/17/21 10:17 AM  Result Value Ref Range   Hemoglobin A1C 7.7 (A) 4.0 - 5.6 %   HbA1c POC (<> result, manual entry)     HbA1c, POC (prediabetic range)     HbA1c, POC (controlled diabetic range)       Assessment & Plan:   1) Uncontrolled type 2 diabetes mellitus with complication, without long-term current use of insulin ([place])  -She has chronically uncontrolled type 2 diabetes of unknown duration.    She presents today, accompanied by her caretaker from the nursing home, with her logs from the facility showing fluctuating glycemic profile with near target fasting and slightly above target postprandial glycemic profile.  Her POCT A1c today 7.7%, essentially unchanged from previous visit of 7.8%.  She did have rare occurrence of hypoglycemia likely associated with meal timing or carb quantity at meals.  - She has complications from diabetes including CKD  however patient remains at a high risk for more acute and chronic complications of diabetes which include CAD, CVA, CKD, retinopathy, and neuropathy.   -  She does not understand the high risk she has, these are all discussed with her care manager from the group home.  - Nutritional counseling repeated at each appointment due to patients tendency to fall back in to old habits.  - The patient admits there is a room for improvement in their diet and drink choices. -  Suggestion is made for the patient to avoid simple carbohydrates from their diet including Cakes, Sweet Desserts / Pastries, Ice Cream, Soda (diet and regular), Sweet Tea, Candies, Chips, Cookies, Sweet Pastries, Store Bought Juices, Alcohol in Excess of 1-2 drinks a day, Artificial Sweeteners, Coffee Creamer, and "Sugar-free" Products. This will help patient to have stable blood glucose profile and potentially avoid unintended  weight gain.   - I encouraged the patient to switch to unprocessed or minimally processed complex starch and increased protein intake (animal or plant source), fruits, and vegetables.   - Patient is advised to stick to a routine mealtimes to eat 3 meals a day and avoid unnecessary snacks (to snack only to correct hypoglycemia).  - I have approached patient with the following individualized plan to manage diabetes and patient agrees:   -Her unlimited access to sweetened beverages is making control of diabetes nearly impossible. Number 1 priority in her case is to avoid hypoglycemia.  -She will continue to need intensive treatment with higher dose of insulin in order for her to achieve and maintain control of diabetes to target.   -Based on her stable glycemic profile, she is advised to continue Humulin R- U500 at 70 units with breakfast and lunch and reduce her dinner dose to 40 units given her bedtime hypoglycemia, if glucose is above 90 and she is eating.  She is advised to continue Metformin 500 mg po twice  daily with meals, continue Glipizide 5 mg XL daily with breakfast, and Tradjenta 5 mg po daily.   -She is encouraged to continue monitoring blood glucose 4 times daily, before meals and before bed, and to call the clinic if she has readings less than 70 or greater than 300 for 3 tests in a row.  - She is not suitable candidate for SGLT2 inhibitors.  - Patient specific target  A1c;  LDL, HDL, Triglycerides,  were discussed in detail.  2) BP/HTN :  Her blood pressure is not controlled to target.   She is advised to continue her current blood pressure medications including Ramipril 5 mg p.o. daily with breakfast, and Lasix 20 mg po daily.    3) Lipids/HPL:  Her most recent lipid panel from 05/11/21 shows uncontrolled but improved LDL of 108.  She is advised to continue Lipitor 40 mg po daily at bedtime.  Side effects and precautions discussed with her.    4) Vitamin D deficiency:  Her most recent vitamin D level was 28.6.  She does not need to start on any supplementation at this time.   5)  Weight/Diet:  Her Body mass index is 42.38 kg/m.  This clearly is complicating her diabetes care.  She is a candidate for modest weight loss.  She cannot exercise optimally.   Exercise, and detailed carbohydrates information provided.  6) Chronic Care/Health Maintenance: -Patient is on ACEI/ARB and Statin medications and encouraged to continue to follow up with Ophthalmology, Podiatrist at least yearly or according to recommendations, and advised to   stay away from smoking. I have recommended yearly flu vaccine and pneumonia vaccination at least every 5 years;  60 minutes of walking 4 days a week is recommended for her , and  sleep for at least 7 hours a day.   - I advised patient to maintain close follow up with Rosita Fire, MD for primary care needs.     I spent 44 minutes in the care of the patient today including review of labs from [facility], Lipids, Thyroid Function, Hematology (current and previous  including abstractions from other facilities); face-to-face time discussing  her blood glucose readings/logs, discussing hypoglycemia and hyperglycemia episodes and symptoms, medications doses, her options of short and long term treatment based on the latest standards of care / guidelines;  discussion about incorporating lifestyle medicine;  and documenting the encounter.    Please refer to Patient Instructions for Blood  Glucose Monitoring and Insulin/Medications Dosing Guide"  in media tab for additional information. Please  also refer to " Patient Self Inventory" in the Media  tab for reviewed elements of pertinent patient history.  Ashley Blanchard participated in the discussions, expressed understanding, and voiced agreement with the above plans.  All questions were answered to her satisfaction. she is encouraged to contact clinic should she have any questions or concerns prior to her return visit.   Follow up plan: - Return in about 4 months (around 09/17/2021) for Diabetes F/U with A1c in office, No previsit labs, Bring meter and logs.  Rayetta Pigg, [facility] [address] [place], [place] [address] Phone: [phone] Fax: [phone]  05/17/2021, 11:05 AM

## 2021-05-22 ENCOUNTER — Other Ambulatory Visit: Payer: Self-pay | Admitting: Nurse Practitioner

## 2021-05-25 ENCOUNTER — Other Ambulatory Visit: Payer: Self-pay | Admitting: Nurse Practitioner

## 2021-05-25 DIAGNOSIS — E1165 Type 2 diabetes mellitus with hyperglycemia: Secondary | ICD-10-CM

## 2021-05-25 DIAGNOSIS — IMO0002 Reserved for concepts with insufficient information to code with codable children: Secondary | ICD-10-CM

## 2021-06-30 ENCOUNTER — Other Ambulatory Visit: Payer: Self-pay | Admitting: Nurse Practitioner

## 2021-06-30 DIAGNOSIS — IMO0002 Reserved for concepts with insufficient information to code with codable children: Secondary | ICD-10-CM

## 2021-06-30 DIAGNOSIS — E1165 Type 2 diabetes mellitus with hyperglycemia: Secondary | ICD-10-CM

## 2021-08-01 ENCOUNTER — Other Ambulatory Visit: Payer: Self-pay | Admitting: "Endocrinology

## 2021-08-28 ENCOUNTER — Other Ambulatory Visit: Payer: Self-pay | Admitting: Nurse Practitioner

## 2021-08-28 ENCOUNTER — Other Ambulatory Visit: Payer: Self-pay | Admitting: "Endocrinology

## 2021-09-18 ENCOUNTER — Ambulatory Visit: Payer: Medicare Other | Admitting: Nurse Practitioner

## 2021-09-18 NOTE — Patient Instructions (Incomplete)

## 2021-09-29 ENCOUNTER — Other Ambulatory Visit: Payer: Self-pay | Admitting: "Endocrinology

## 2021-10-09 ENCOUNTER — Ambulatory Visit (INDEPENDENT_AMBULATORY_CARE_PROVIDER_SITE_OTHER): Payer: Medicare Other | Admitting: Nurse Practitioner

## 2021-10-09 ENCOUNTER — Encounter: Payer: Self-pay | Admitting: Nurse Practitioner

## 2021-10-09 VITALS — BP 129/84 | HR 91 | Ht 60.0 in | Wt 215.0 lb

## 2021-10-09 DIAGNOSIS — Z794 Long term (current) use of insulin: Secondary | ICD-10-CM | POA: Diagnosis not present

## 2021-10-09 DIAGNOSIS — N1832 Chronic kidney disease, stage 3b: Secondary | ICD-10-CM | POA: Diagnosis not present

## 2021-10-09 DIAGNOSIS — E782 Mixed hyperlipidemia: Secondary | ICD-10-CM | POA: Diagnosis not present

## 2021-10-09 DIAGNOSIS — E559 Vitamin D deficiency, unspecified: Secondary | ICD-10-CM | POA: Diagnosis not present

## 2021-10-09 DIAGNOSIS — E1122 Type 2 diabetes mellitus with diabetic chronic kidney disease: Secondary | ICD-10-CM

## 2021-10-09 DIAGNOSIS — I10 Essential (primary) hypertension: Secondary | ICD-10-CM | POA: Diagnosis not present

## 2021-10-09 LAB — POCT GLYCOSYLATED HEMOGLOBIN (HGB A1C): HbA1c POC (<> result, manual entry): 7.8 % (ref 4.0–5.6)

## 2021-10-09 NOTE — Patient Instructions (Signed)

## 2021-10-09 NOTE — Progress Notes (Signed)
10/09/2021  Endocrinology follow-up note   Subjective:    Patient ID: Ashley Blanchard, female    DOB: Sep 25, 1966.   She is being seen in follow-up for history of uncontrolled type 2 diabetes, hyperlipidemia, hypertension. She is accompanied by nursing staff who is not immersed in her medical condition.  PMD:   Ashley Gully, MD  Past Medical History:  Diagnosis Date   Chronic anemia    Diabetes mellitus, type II (HCC)    Hearing loss    Hyperlipidemia    Hypertension    Mild mental retardation    Past Surgical History:  Procedure Laterality Date   CATARACT EXTRACTION     RETINAL DETACHMENT SURGERY     Social History   Socioeconomic History   Marital status: Single    Spouse name: Not on file   Number of children: Not on file   Years of education: Not on file   Highest education level: Not on file  Occupational History   Not on file  Tobacco Use   Smoking status: Never   Smokeless tobacco: Never  Vaping Use   Vaping Use: Never used  Substance and Sexual Activity   Alcohol use: No   Drug use: No   Sexual activity: Never    Birth control/protection: Post-menopausal  Other Topics Concern   Not on file  Social History Narrative   Not on file   Social Determinants of Health   Financial Resource Strain: Not on file  Food Insecurity: Not on file  Transportation Needs: Not on file  Physical Activity: Not on file  Stress: Not on file  Social Connections: Not on file   Outpatient Encounter Medications as of 10/09/2021  Medication Sig   ALLERGY RELIEF 10 MG tablet Take 10 mg by mouth daily.   aspirin EC 81 MG tablet Take 81 mg by mouth daily.   atorvastatin (LIPITOR) 40 MG tablet Take 1 tablet (40 mg total) by mouth daily.   Dextrose, Diabetic Use, (TRUEPLUS GLUCOSE PO) Take by mouth.   diltiazem (CARDIZEM CD) 180 MG 24 hr capsule Take 180 mg by mouth daily.   DROPSAFE SAFETY PEN NEEDLES 31G X 6 MM MISC USE TO INJECT INSULIN FOUR TIMES DAILY   EASYMAX TEST test  strip USE TO CHECK BLOOD SUGAR 4 TIMES DAILY BEFORE BREAKFAST,LUNCH,SUPPER AND AT BEDTIME.(HOLD IF BS<70:CALL MD IF BS>400)   FEROSUL 325 (65 Fe) MG tablet Take by mouth.   FORA Lancets MISC USE TO CHECK BLOOD SUGAR 4 TIMES DAILY BEFORE BREAKFAST,LUNCH,SUPPER AND AT BEDTIME.(HOLD IF BS<70:CALL MD IF BS>400)   furosemide (LASIX) 20 MG tablet Take 20 mg by mouth.   GLIPIZIDE XL 5 MG 24 hr tablet TAKE 1 TABLET BY MOUTH DAILY AT BREAKFAST.   HUMULIN R U-500 KWIKPEN 500 UNIT/ML KwikPen INJECT 70 UNITS SUBCUTANEOUSLY AT BREAKFAST & LUNCH(IF BS>90 & EATING): (HOLD IF BS<70: NOTIFY MD IF BS>400) INJECT 40 UNITS AT DINNER.   metFORMIN (GLUCOPHAGE) 500 MG tablet Take by mouth 2 (two) times daily with a meal.   ramipril (ALTACE) 5 MG capsule Take 5 mg by mouth daily.   risperiDONE (RISPERDAL) 1 MG tablet Take 1 mg by mouth at bedtime.   sertraline (ZOLOFT) 100 MG tablet Take 100 mg by mouth daily.   TRADJENTA 5 MG TABS tablet Take 5 mg by mouth daily.   traZODone (DESYREL) 50 MG tablet Take 50 mg by mouth at bedtime.   No facility-administered encounter medications on file as of 10/09/2021.   ALLERGIES: No Known  Allergies VACCINATION STATUS:  There is no immunization history on file for this patient.  Diabetes She presents for her follow-up diabetic visit. She has type 2 diabetes mellitus. Onset time: She is not sure when she was diagnosed with diabetes. Her disease course has been stable. There are no hypoglycemic associated symptoms. Pertinent negatives for hypoglycemia include no confusion, headaches, pallor or seizures. Pertinent negatives for diabetes include no chest pain, no fatigue, no polydipsia, no polyphagia and no polyuria. There are no hypoglycemic complications. Symptoms are stable. Diabetic complications include nephropathy. Risk factors for coronary artery disease include diabetes mellitus, hypertension, dyslipidemia, obesity and sedentary lifestyle. Current diabetic treatment includes  intensive insulin program and oral agent (triple therapy). She is compliant with treatment most of the time (She is in a group home due to failure to care for self.). Her weight is stable. She is following a generally unhealthy diet. When asked about meal planning, she reported none. She has not had a previous visit with a dietitian. She rarely participates in exercise. Her home blood glucose trend is fluctuating minimally. Her breakfast blood glucose range is generally 140-180 mg/dl. Her lunch blood glucose range is generally 180-200 mg/dl. Her dinner blood glucose range is generally >200 mg/dl. Her bedtime blood glucose range is generally 180-200 mg/dl. (She presents today, accompanied by her care attendant from the nursing home, with her readings showing fluctuating but stable glycemic profile with no recent hypoglycemia.  Her POCT A1c today is 7.8%, essentially unchanged from previous visit.  ) An ACE inhibitor/angiotensin II receptor blocker is being taken. She sees a podiatrist.Eye exam is current.  Hypertension This is a chronic problem. The current episode started more than 1 year ago. The problem has been gradually improving since onset. The problem is controlled. Pertinent negatives include no chest pain, headaches, palpitations or shortness of breath. There are no associated agents to hypertension. Risk factors for coronary artery disease include dyslipidemia, diabetes mellitus, obesity and sedentary lifestyle. Past treatments include ACE inhibitors and diuretics. The current treatment provides moderate improvement. There are no compliance problems.  Hypertensive end-organ damage includes kidney disease. Identifiable causes of hypertension include chronic renal disease.  Hyperlipidemia This is a chronic problem. The current episode started more than 1 year ago. The problem is uncontrolled. Recent lipid tests were reviewed and are high. Exacerbating diseases include chronic renal disease, diabetes and  obesity. Pertinent negatives include no chest pain, myalgias or shortness of breath. Current antihyperlipidemic treatment includes statins. The current treatment provides mild improvement of lipids. Compliance problems include adherence to exercise.  Risk factors for coronary artery disease include diabetes mellitus, dyslipidemia, hypertension, obesity and a sedentary lifestyle.   Review of systems  Constitutional: + stable body weight,  current Body mass index is 41.99 kg/m. , no fatigue, no subjective hyperthermia, no subjective hypothermia Eyes: no blurry vision, no xerophthalmia ENT: no sore throat, no nodules palpated in throat, no dysphagia/odynophagia, no hoarseness Cardiovascular: no chest pain, no shortness of breath, no palpitations, no leg swelling Respiratory: no cough, no shortness of breath Gastrointestinal: no nausea/vomiting/diarrhea Musculoskeletal: no muscle/joint aches Skin: no rashes, no hyperemia Neurological: no tremors, no numbness, no tingling, no dizziness Psychiatric: no depression, no anxiety, mild MR  Objective:    BP 129/84   Pulse 91   Ht 5' (1.524 m)   Wt 215 lb (97.5 kg)   LMP  (LMP Unknown)   BMI 41.99 kg/m   Wt Readings from Last 3 Encounters:  10/09/21 215 lb (97.5 kg)  05/17/21 217 lb (98.4 kg)  01/13/21 218 lb 9.6 oz (99.2 kg)   BP Readings from Last 3 Encounters:  10/09/21 129/84  05/17/21 (!) 163/83  01/13/21 123/78     Physical Exam- Limited  Constitutional:  Body mass index is 41.99 kg/m. , not in acute distress, mild MR, not conversational- nods and gestures appropriately Eyes:  EOMI, no exophthalmos Neck: Supple Cardiovascular: RRR, no murmurs, rubs, or gallops, no edema Respiratory: Adequate breathing efforts, no crackles, rales, rhonchi, or wheezing Musculoskeletal: no gross deformities, strength intact in all four extremities, no gross restriction of joint movements Skin:  no rashes, no hyperemia Neurological: no tremor with  outstretched hands     Lipid Panel     Component Value Date/Time   CHOL 174 05/11/2021 0824   TRIG 104 05/11/2021 0824   HDL 47 05/11/2021 0824   CHOLHDL 3.7 05/11/2021 0824   CHOLHDL 4.0 05/19/2020 0820   LDLCALC 108 (H) 05/11/2021 0824   LDLCALC 129 (H) 05/19/2020 0820   Recent Results (from the past 2160 hour(s))  HgB A1c     Status: Abnormal   Collection Time: 10/09/21 10:03 AM  Result Value Ref Range   Hemoglobin A1C     HbA1c POC (<> result, manual entry) 7.8 4.0 - 5.6 %   HbA1c, POC (prediabetic range)     HbA1c, POC (controlled diabetic range)        Assessment & Plan:   1) Uncontrolled type 2 diabetes mellitus with complication, without long-term current use of insulin (HCC)  -She has chronically uncontrolled type 2 diabetes of unknown duration.    She presents today, accompanied by her care attendant from the nursing home, with her readings showing fluctuating but stable glycemic profile with no recent hypoglycemia.  Her POCT A1c today is 7.8%, essentially unchanged from previous visit.    - She has complications from diabetes including CKD however patient remains at a high risk for more acute and chronic complications of diabetes which include CAD, CVA, CKD, retinopathy, and neuropathy.   -  She does not understand the high risk she has, these are all discussed with her care manager from the group home.  - Nutritional counseling repeated at each appointment due to patients tendency to fall back in to old habits.  - The patient admits there is a room for improvement in their diet and drink choices. -  Suggestion is made for the patient to avoid simple carbohydrates from their diet including Cakes, Sweet Desserts / Pastries, Ice Cream, Soda (diet and regular), Sweet Tea, Candies, Chips, Cookies, Sweet Pastries, Store Bought Juices, Alcohol in Excess of 1-2 drinks a day, Artificial Sweeteners, Coffee Creamer, and "Sugar-free" Products. This will help patient to have  stable blood glucose profile and potentially avoid unintended weight gain.   - I encouraged the patient to switch to unprocessed or minimally processed complex starch and increased protein intake (animal or plant source), fruits, and vegetables.   - Patient is advised to stick to a routine mealtimes to eat 3 meals a day and avoid unnecessary snacks (to snack only to correct hypoglycemia).  - I have approached patient with the following individualized plan to manage diabetes and patient agrees:   -Her unlimited access to sweetened beverages is making control of diabetes nearly impossible. Number 1 priority in her case is to avoid hypoglycemia.  -She will continue to need intensive treatment with higher dose of insulin in order for her to achieve and maintain control of diabetes to  target.   -Based on her stable glycemic profile, she is advised to continue Humulin R- U500 at 70 units with breakfast and lunch and reduce her dinner dose to 40 units given her bedtime hypoglycemia, if glucose is above 90 and she is eating.  She is advised to continue Metformin 500 mg po twice daily with meals, continue Glipizide 5 mg XL daily with breakfast, and Tradjenta 5 mg po daily.   -She is encouraged to continue monitoring blood glucose 4 times daily, before meals and before bed, and to call the clinic if she has readings less than 70 or greater than 300 for 3 tests in a row.  - She is not suitable candidate for SGLT2 inhibitors.  - Patient specific target  A1c;  LDL, HDL, Triglycerides,  were discussed in detail.  2) BP/HTN :  Her blood pressure is controlled to target.   She is advised to continue her current blood pressure medications including Ramipril 5 mg p.o. daily with breakfast, and Lasix 20 mg po daily.    3) Lipids/HPL:  Her most recent lipid panel from 05/11/21 shows uncontrolled but improved LDL of 108.  She is advised to continue Lipitor 40 mg po daily at bedtime.  Side effects and precautions  discussed with her.    4) Vitamin D deficiency:  Her most recent vitamin D level was 28.6.  She does not need to start on any supplementation at this time.   5)  Weight/Diet:  Her Body mass index is 41.99 kg/m.  This clearly is complicating her diabetes care.  She is a candidate for modest weight loss.  She cannot exercise optimally.   Exercise, and detailed carbohydrates information provided.  6) Chronic Care/Health Maintenance: -Patient is on ACEI/ARB and Statin medications and encouraged to continue to follow up with Ophthalmology, Podiatrist at least yearly or according to recommendations, and advised to   stay away from smoking. I have recommended yearly flu vaccine and pneumonia vaccination at least every 5 years;  60 minutes of walking 4 days a week is recommended for her , and  sleep for at least 7 hours a day.   - I advised patient to maintain close follow up with Ashley Gully, MD for primary care needs.    I spent 30 minutes in the care of the patient today including review of labs from CMP, Lipids, Thyroid Function, Hematology (current and previous including abstractions from other facilities); face-to-face time discussing  her blood glucose readings/logs, discussing hypoglycemia and hyperglycemia episodes and symptoms, medications doses, her options of short and long term treatment based on the latest standards of care / guidelines;  discussion about incorporating lifestyle medicine;  and documenting the encounter.    Please refer to Patient Instructions for Blood Glucose Monitoring and Insulin/Medications Dosing Guide"  in media tab for additional information. Please  also refer to " Patient Self Inventory" in the Media  tab for reviewed elements of pertinent patient history.  Zekiah Lewey participated in the discussions, expressed understanding, and voiced agreement with the above plans.  All questions were answered to her satisfaction. she is encouraged to contact clinic should  she have any questions or concerns prior to her return visit.   Follow up plan: - Return in about 4 months (around 02/06/2022) for Diabetes F/U with A1c in office, Previsit labs, Bring meter and logs.  Ronny Bacon, [facility] [address] [place], [place] [address] Phone: [phone] Fax: [phone]  10/09/2021, 10:21 AM

## 2021-11-04 IMAGING — MG MM DIGITAL SCREENING BILAT W/ TOMO AND CAD
6 of 10 series · 6 of 30 positions shown · non-contrast
Comparison: Previous exam(s).

CLINICAL DATA: Screening.

EXAM:
DIGITAL SCREENING BILATERAL MAMMOGRAM WITH TOMOSYNTHESIS AND CAD
TECHNIQUE: Bilateral screening digital craniocaudal and mediolateral oblique
mammograms were obtained. Bilateral screening digital breast
tomosynthesis was performed. The images were evaluated with
computer-aided detection.

[L CC synth-2D]
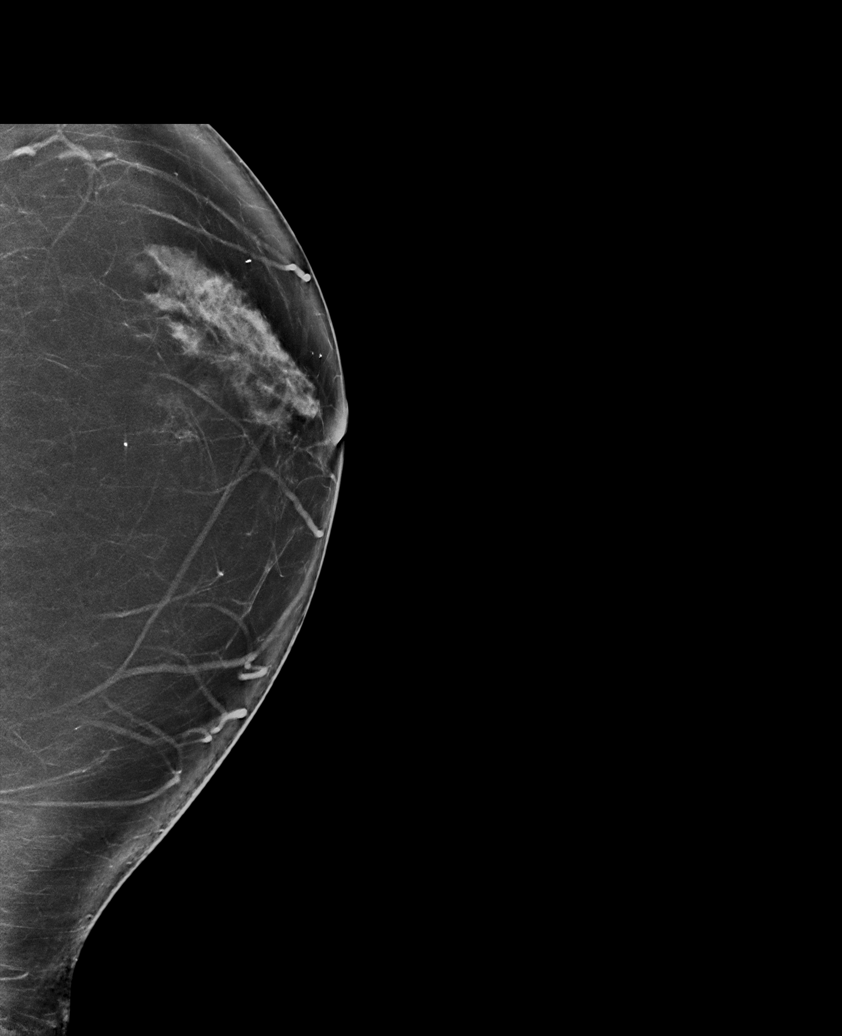

[L MLO synth-2D (1 of 2)]
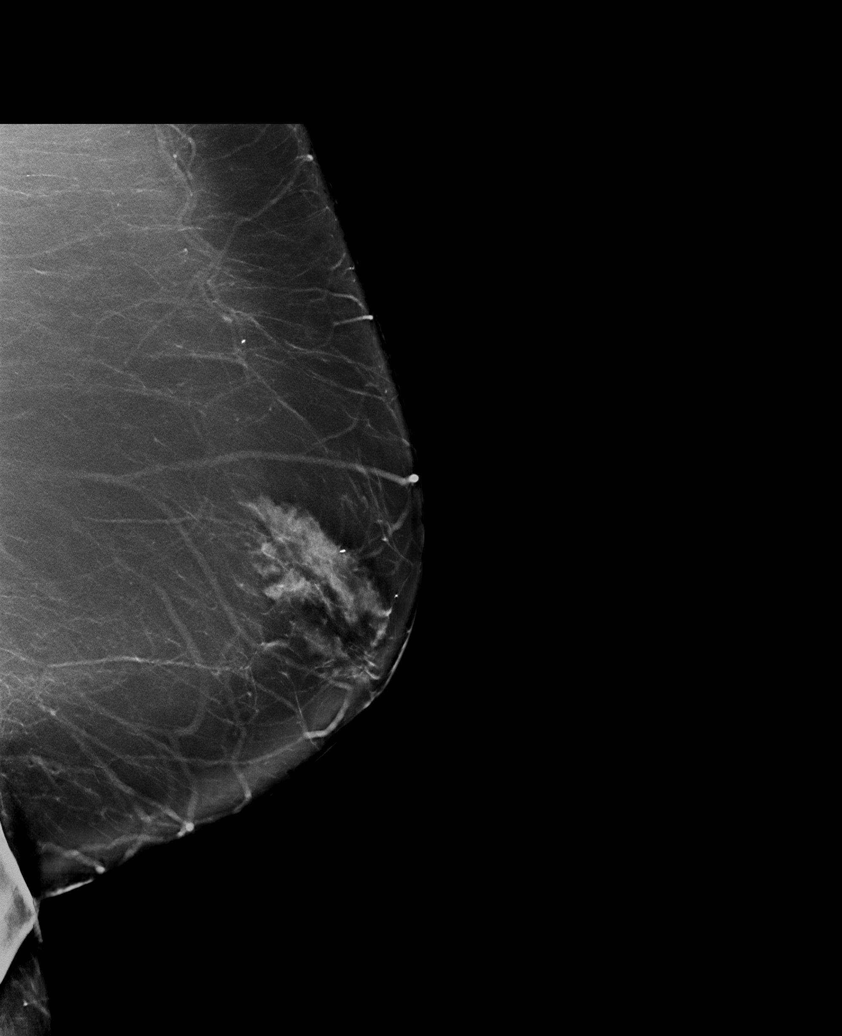

[L MLO synth-2D (2 of 2)]
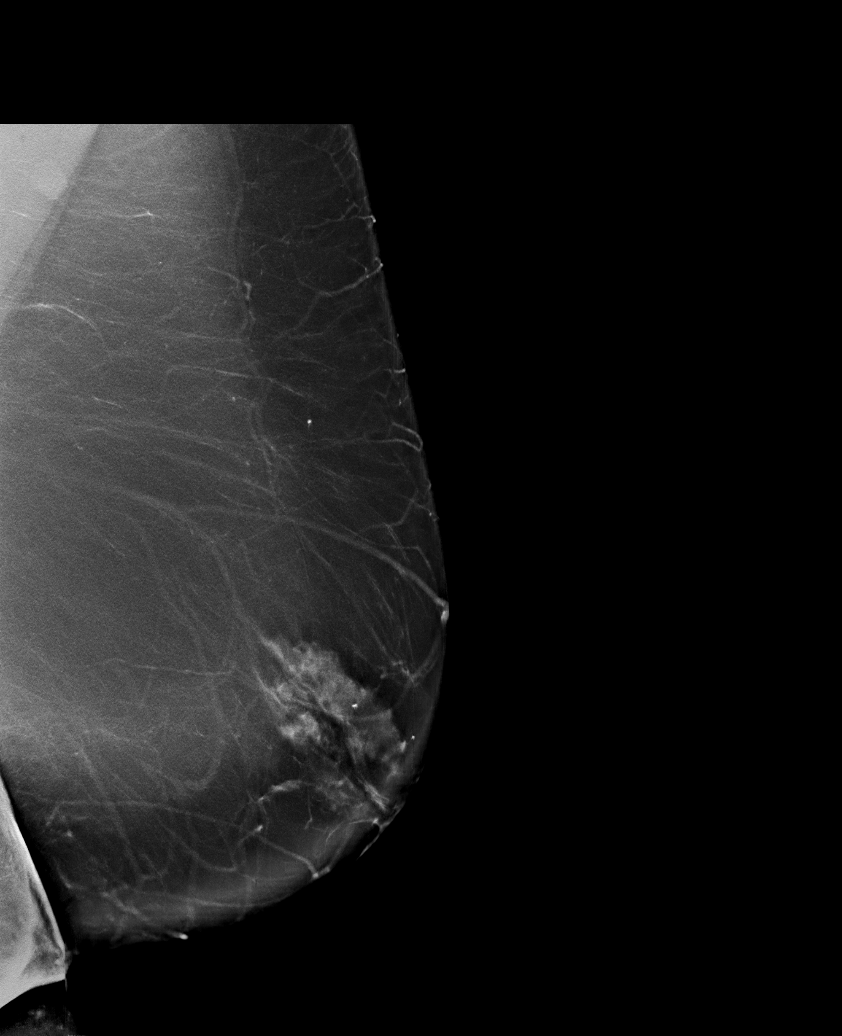

[R CC synth-2D]
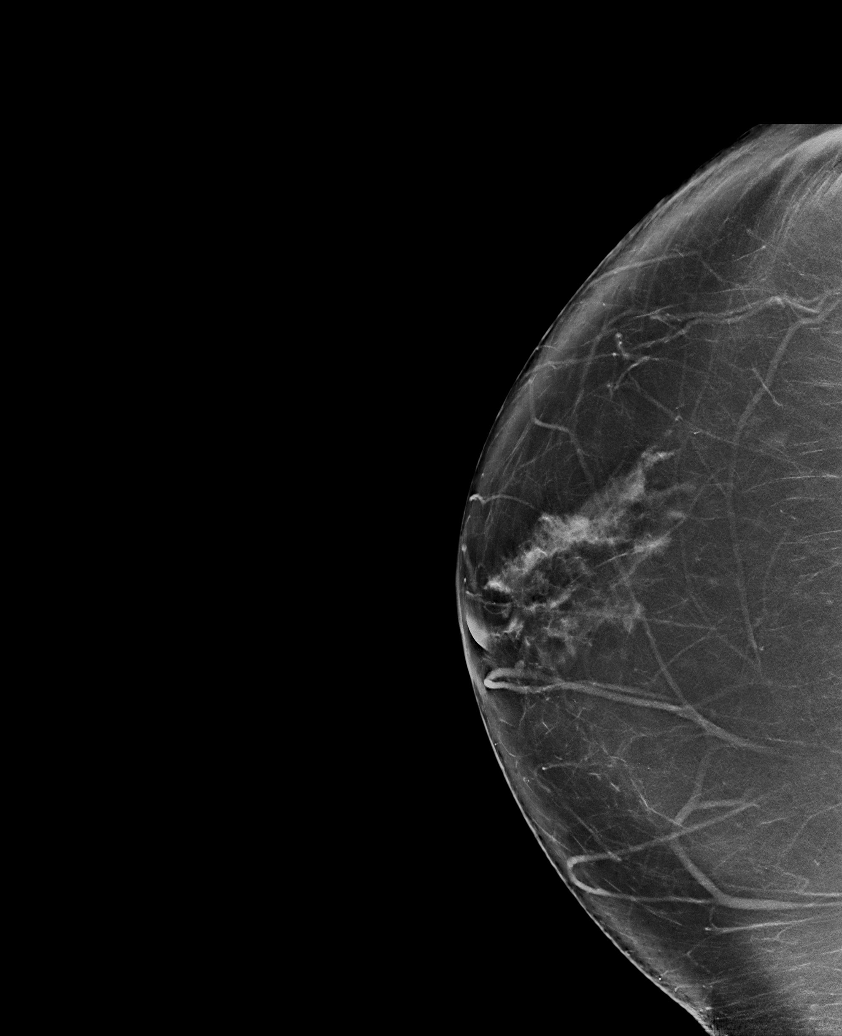

[R MLO synth-2D]
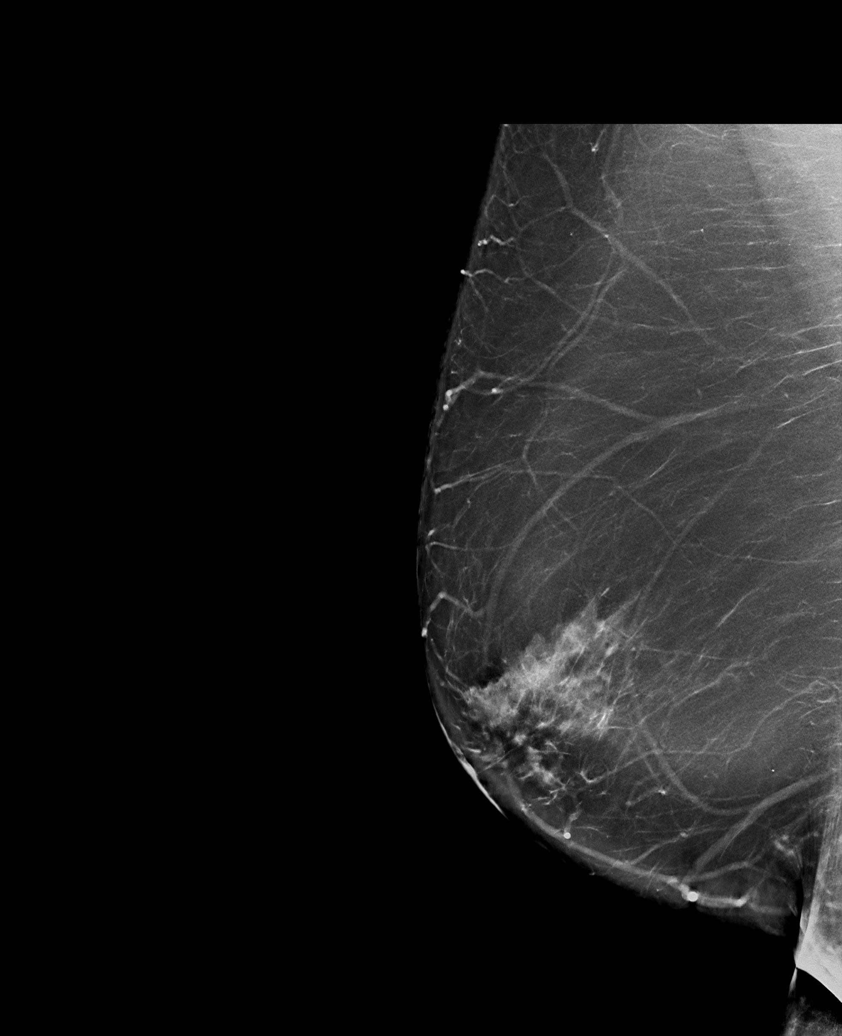

[R CC tomo · tomo slice 45/90.0]
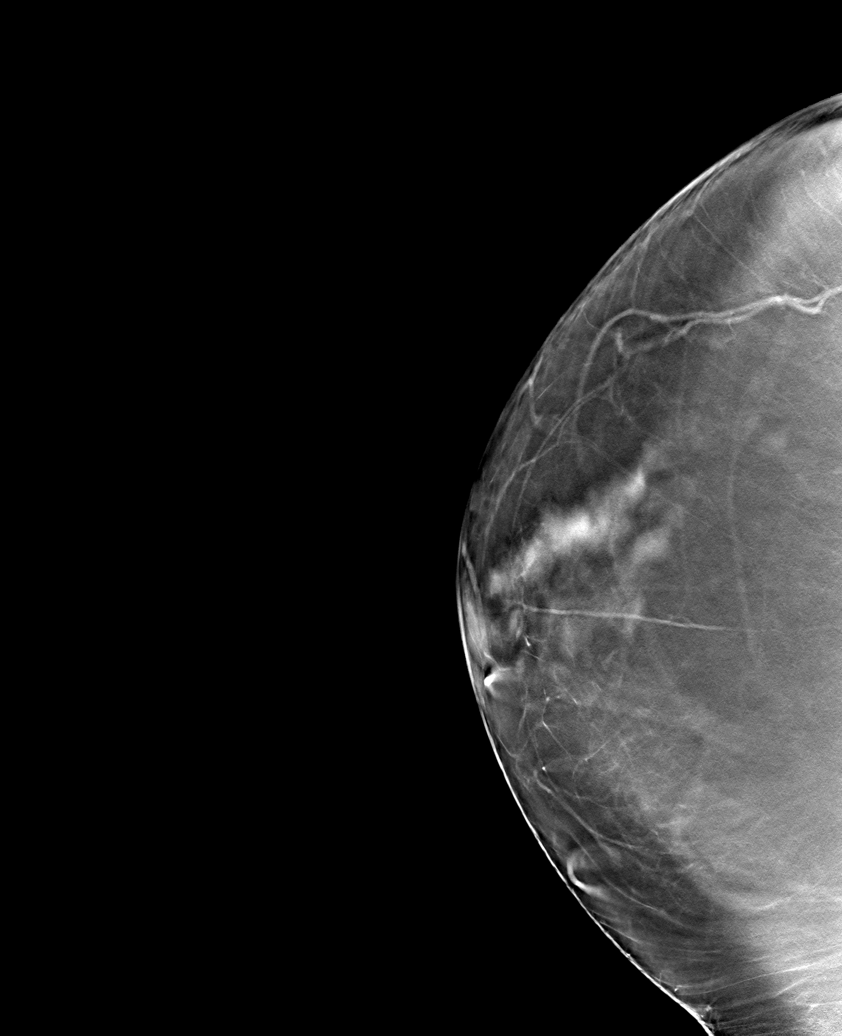

[6 of 30 positions shown; findings below may reference images not displayed]

ACR Breast Density Category b: There are scattered areas of
fibroglandular density.
FINDINGS: There are no findings suspicious for malignancy. The images were
evaluated with computer-aided detection.
IMPRESSION: No mammographic evidence of malignancy. A result letter of this
screening mammogram will be mailed directly to the patient.

RECOMMENDATION:
Screening mammogram in one year. (Code:[id number]-I-[id number])

BI-RADS CATEGORY  1: Negative.

## 2021-12-05 ENCOUNTER — Other Ambulatory Visit: Payer: Self-pay | Admitting: Nurse Practitioner

## 2021-12-05 NOTE — Telephone Encounter (Signed)
Facility called and states pt is completely out of insulin and would like to know if you could refill.

## 2022-01-19 ENCOUNTER — Other Ambulatory Visit: Payer: Self-pay | Admitting: Nurse Practitioner

## 2022-02-02 LAB — COMPREHENSIVE METABOLIC PANEL
ALT: 19 IU/L (ref 0–32)
AST: 25 IU/L (ref 0–40)
Albumin/Globulin Ratio: 1.3 (ref 1.2–2.2)
Albumin: 3.9 g/dL (ref 3.8–4.9)
Alkaline Phosphatase: 119 IU/L (ref 44–121)
BUN/Creatinine Ratio: 12 (ref 9–23)
BUN: 16 mg/dL (ref 6–24)
Bilirubin Total: 0.3 mg/dL (ref 0.0–1.2)
CO2: 26 mmol/L (ref 20–29)
Calcium: 10.1 mg/dL (ref 8.7–10.2)
Chloride: 100 mmol/L (ref 96–106)
Creatinine, Ser: 1.32 mg/dL — ABNORMAL HIGH (ref 0.57–1.00)
Globulin, Total: 3.1 g/dL (ref 1.5–4.5)
Glucose: 86 mg/dL (ref 70–99)
Potassium: 4.4 mmol/L (ref 3.5–5.2)
Sodium: 140 mmol/L (ref 134–144)
Total Protein: 7 g/dL (ref 6.0–8.5)
eGFR: 47 mL/min/{1.73_m2} — ABNORMAL LOW (ref >59)

## 2022-02-06 ENCOUNTER — Encounter: Payer: Self-pay | Admitting: Nurse Practitioner

## 2022-02-06 ENCOUNTER — Other Ambulatory Visit: Payer: Self-pay

## 2022-02-06 ENCOUNTER — Ambulatory Visit (INDEPENDENT_AMBULATORY_CARE_PROVIDER_SITE_OTHER): Payer: Medicare Other | Admitting: Nurse Practitioner

## 2022-02-06 VITALS — BP 115/76 | HR 107 | Ht 60.0 in | Wt 216.2 lb

## 2022-02-06 DIAGNOSIS — E559 Vitamin D deficiency, unspecified: Secondary | ICD-10-CM | POA: Diagnosis not present

## 2022-02-06 DIAGNOSIS — I10 Essential (primary) hypertension: Secondary | ICD-10-CM

## 2022-02-06 DIAGNOSIS — N1831 Chronic kidney disease, stage 3a: Secondary | ICD-10-CM | POA: Diagnosis not present

## 2022-02-06 DIAGNOSIS — Z794 Long term (current) use of insulin: Secondary | ICD-10-CM

## 2022-02-06 DIAGNOSIS — E782 Mixed hyperlipidemia: Secondary | ICD-10-CM

## 2022-02-06 DIAGNOSIS — E1122 Type 2 diabetes mellitus with diabetic chronic kidney disease: Secondary | ICD-10-CM | POA: Diagnosis not present

## 2022-02-06 LAB — POCT GLYCOSYLATED HEMOGLOBIN (HGB A1C): HbA1c, POC (controlled diabetic range): 7.6 % — AB (ref 0.0–7.0)

## 2022-02-06 NOTE — Progress Notes (Signed)
?02/06/2022 ? ?Endocrinology follow-up note ? ? ?Subjective:  ? ? Patient ID: Ashley Blanchard, female    DOB: July 02, 1966.   ?She is being seen in follow-up for history of uncontrolled type 2 diabetes, hyperlipidemia, hypertension. She is accompanied by nursing staff who is not immersed in her medical condition. ? ?PMD:   Benetta SparFanta, Tesfaye Demissie, MD ? ?Past Medical History:  ?Diagnosis Date  ? Chronic anemia   ? Diabetes mellitus, type II (HCC)   ? Hearing loss   ? Hyperlipidemia   ? Hypertension   ? Mild mental retardation   ? ?Past Surgical History:  ?Procedure Laterality Date  ? CATARACT EXTRACTION    ? RETINAL DETACHMENT SURGERY    ? ?Social History  ? ?Socioeconomic History  ? Marital status: Single  ?  Spouse name: Not on file  ? Number of children: Not on file  ? Years of education: Not on file  ? Highest education level: Not on file  ?Occupational History  ? Not on file  ?Tobacco Use  ? Smoking status: Never  ? Smokeless tobacco: Never  ?Vaping Use  ? Vaping Use: Never used  ?Substance and Sexual Activity  ? Alcohol use: No  ? Drug use: No  ? Sexual activity: Never  ?  Birth control/protection: Post-menopausal  ?Other Topics Concern  ? Not on file  ?Social History Narrative  ? Not on file  ? ?Social Determinants of Health  ? ?Financial Resource Strain: Not on file  ?Food Insecurity: Not on file  ?Transportation Needs: Not on file  ?Physical Activity: Not on file  ?Stress: Not on file  ?Social Connections: Not on file  ? ?Outpatient Encounter Medications as of 02/06/2022  ?Medication Sig  ? ALLERGY RELIEF 10 MG tablet Take 10 mg by mouth daily.  ? aspirin EC 81 MG tablet Take 81 mg by mouth daily.  ? atorvastatin (LIPITOR) 40 MG tablet Take 1 tablet (40 mg total) by mouth daily.  ? benzonatate (TESSALON) 100 MG capsule Take by mouth.  ? Dextrose, Diabetic Use, (TRUEPLUS GLUCOSE PO) Take by mouth.  ? diltiazem (CARDIZEM CD) 180 MG 24 hr capsule Take 180 mg by mouth daily.  ? DROPSAFE SAFETY PEN NEEDLES 31G X 6 MM  MISC USE TO INJECT INSULIN FOUR TIMES DAILY  ? EASYMAX TEST test strip USE TO CHECK BLOOD SUGAR 4 TIMES DAILY BEFORE BREAKFAST,LUNCH,SUPPER AND AT BEDTIME.(HOLD IF BS<70:CALL MD IF BS>400)  ? FEROSUL 325 (65 Fe) MG tablet Take by mouth.  ? FORA Lancets MISC USE TO CHECK BLOOD SUGAR 4 TIMES DAILY BEFORE BREAKFAST,LUNCH,SUPPER AND AT BEDTIME.(HOLD IF BS<70:CALL MD IF BS>400)  ? furosemide (LASIX) 20 MG tablet Take 20 mg by mouth.  ? GLIPIZIDE XL 5 MG 24 hr tablet TAKE 1 TABLET BY MOUTH DAILY AT BREAKFAST.  ? HUMULIN R U-500 KWIKPEN 500 UNIT/ML KwikPen INJECT 70 UNITS SUBCUTANEOUSLY AT BREAKFAST & LUNCH (IF BS>90 & EATING): ( NOTIFY MD IF BS>400) INJECT 40 UNITS AT DINNER.  ? metFORMIN (GLUCOPHAGE) 500 MG tablet Take by mouth 2 (two) times daily with a meal.  ? ramipril (ALTACE) 5 MG capsule Take 5 mg by mouth daily.  ? risperiDONE (RISPERDAL) 1 MG tablet Take 1 mg by mouth at bedtime.  ? sertraline (ZOLOFT) 100 MG tablet Take 100 mg by mouth daily.  ? TRADJENTA 5 MG TABS tablet Take 5 mg by mouth daily.  ? traZODone (DESYREL) 50 MG tablet Take 50 mg by mouth at bedtime.  ? TRUEPLUS GLUCOSE ON THE  GO 4 g chewable tablet Chew by mouth.  ? ?No facility-administered encounter medications on file as of 02/06/2022.  ? ?ALLERGIES: ?No Known Allergies ?VACCINATION STATUS: ? ?There is no immunization history on file for this patient. ? ?Diabetes ?She presents for her follow-up diabetic visit. She has type 2 diabetes mellitus. Onset time: She is not sure when she was diagnosed with diabetes. Her disease course has been stable. There are no hypoglycemic associated symptoms. Pertinent negatives for hypoglycemia include no confusion, headaches, pallor or seizures. Pertinent negatives for diabetes include no chest pain, no fatigue, no polydipsia, no polyphagia and no polyuria. There are no hypoglycemic complications. Symptoms are stable. Diabetic complications include nephropathy. Risk factors for coronary artery disease include  diabetes mellitus, hypertension, dyslipidemia, obesity and sedentary lifestyle. Current diabetic treatment includes intensive insulin program and oral agent (triple therapy). She is compliant with treatment most of the time (She is in a group home due to failure to care for self.). Her weight is stable. She is following a generally unhealthy diet. When asked about meal planning, she reported none. She has not had a previous visit with a dietitian. She rarely participates in exercise. Her home blood glucose trend is fluctuating minimally. (She presents today, accompanied by care attendant from the nursing home, with her logs showing stable glycemic profile overall.  There are still some fluctuations but nothing too serious.  Her POCT A1c today is 7.6%, improving from last visit of 7.8%.  ) An ACE inhibitor/angiotensin II receptor blocker is being taken. She sees a podiatrist.Eye exam is current.  ?Hypertension ?This is a chronic problem. The current episode started more than 1 year ago. The problem has been gradually improving since onset. The problem is controlled. Pertinent negatives include no chest pain, headaches, palpitations or shortness of breath. There are no associated agents to hypertension. Risk factors for coronary artery disease include dyslipidemia, diabetes mellitus, obesity and sedentary lifestyle. Past treatments include ACE inhibitors and diuretics. The current treatment provides moderate improvement. There are no compliance problems.  Hypertensive end-organ damage includes kidney disease. Identifiable causes of hypertension include chronic renal disease.  ?Hyperlipidemia ?This is a chronic problem. The current episode started more than 1 year ago. The problem is uncontrolled. Recent lipid tests were reviewed and are high. Exacerbating diseases include chronic renal disease, diabetes and obesity. Pertinent negatives include no chest pain, myalgias or shortness of breath. Current antihyperlipidemic  treatment includes statins. The current treatment provides mild improvement of lipids. Compliance problems include adherence to exercise.  Risk factors for coronary artery disease include diabetes mellitus, dyslipidemia, hypertension, obesity and a sedentary lifestyle.  ? ?Review of systems ? ?Constitutional: + stable body weight,  current Body mass index is 42.22 kg/m?. , no fatigue, no subjective hyperthermia, no subjective hypothermia ?Eyes: no blurry vision, no xerophthalmia ?ENT: no sore throat, no nodules palpated in throat, no dysphagia/odynophagia, no hoarseness ?Cardiovascular: no chest pain, no shortness of breath, no palpitations, no leg swelling ?Respiratory: no cough, no shortness of breath ?Gastrointestinal: no nausea/vomiting/diarrhea ?Musculoskeletal: no muscle/joint aches ?Skin: no rashes, no hyperemia ?Neurological: no tremors, no numbness, no tingling, no dizziness ?Psychiatric: no depression, no anxiety, mild MR ? ?Objective:  ?  ?BP 115/76   Pulse (!) 107   Ht 5' (1.524 m)   Wt 216 lb 3.2 oz (98.1 kg)   LMP  (LMP Unknown)   SpO2 98%   BMI 42.22 kg/m?   ?Wt Readings from Last 3 Encounters:  ?02/06/22 216 lb 3.2 oz (98.1  kg)  ?10/09/21 215 lb (97.5 kg)  ?05/17/21 217 lb (98.4 kg)  ? ?BP Readings from Last 3 Encounters:  ?02/06/22 115/76  ?10/09/21 129/84  ?05/17/21 (!) 163/83  ?  ? ?Physical Exam- Limited ? ?Constitutional:  Body mass index is 42.22 kg/m?. , not in acute distress, mild MR, not conversational- nods and gestures appropriately ?Eyes:  EOMI, no exophthalmos ?Neck: Supple ?Cardiovascular: RRR, no murmurs, rubs, or gallops, no edema ?Respiratory: Adequate breathing efforts, no crackles, rales, rhonchi, or wheezing ?Musculoskeletal: no gross deformities, strength intact in all four extremities, no gross restriction of joint movements ?Skin:  no rashes, no hyperemia ?Neurological: no tremor with outstretched hands ? ? ?Diabetic Foot Exam - Simple   ?Simple Foot Form ?Diabetic Foot  exam was performed with the following findings: Yes 02/06/2022 10:20 AM  ?Visual Inspection ?See comments: Yes ?Sensation Testing ?Intact to touch and monofilament testing bilaterally: Yes ?Pulse Check ?Posterior

## 2022-02-26 ENCOUNTER — Other Ambulatory Visit: Payer: Self-pay | Admitting: Nurse Practitioner

## 2022-03-16 ENCOUNTER — Other Ambulatory Visit: Payer: Self-pay | Admitting: Nurse Practitioner

## 2022-04-05 ENCOUNTER — Other Ambulatory Visit: Payer: Self-pay | Admitting: Nurse Practitioner

## 2022-04-30 ENCOUNTER — Other Ambulatory Visit (HOSPITAL_COMMUNITY): Payer: Self-pay | Admitting: Internal Medicine

## 2022-04-30 DIAGNOSIS — Z1231 Encounter for screening mammogram for malignant neoplasm of breast: Secondary | ICD-10-CM

## 2022-05-10 ENCOUNTER — Other Ambulatory Visit: Payer: Self-pay | Admitting: "Endocrinology

## 2022-05-29 ENCOUNTER — Other Ambulatory Visit: Payer: Self-pay | Admitting: Nurse Practitioner

## 2022-06-06 LAB — COMPREHENSIVE METABOLIC PANEL
ALT: 15 IU/L (ref 0–32)
AST: 16 IU/L (ref 0–40)
Albumin/Globulin Ratio: 1.6 (ref 1.2–2.2)
Albumin: 3.7 g/dL — ABNORMAL LOW (ref 3.8–4.9)
Alkaline Phosphatase: 97 IU/L (ref 44–121)
BUN/Creatinine Ratio: 16 (ref 9–23)
BUN: 20 mg/dL (ref 6–24)
Bilirubin Total: 0.2 mg/dL (ref 0.0–1.2)
CO2: 26 mmol/L (ref 20–29)
Calcium: 10.3 mg/dL — ABNORMAL HIGH (ref 8.7–10.2)
Chloride: 101 mmol/L (ref 96–106)
Creatinine, Ser: 1.26 mg/dL — ABNORMAL HIGH (ref 0.57–1.00)
Globulin, Total: 2.3 g/dL (ref 1.5–4.5)
Glucose: 107 mg/dL — ABNORMAL HIGH (ref 70–99)
Potassium: 4.7 mmol/L (ref 3.5–5.2)
Sodium: 140 mmol/L (ref 134–144)
Total Protein: 6 g/dL (ref 6.0–8.5)
eGFR: 49 mL/min/{1.73_m2} — ABNORMAL LOW (ref >59)

## 2022-06-06 LAB — TSH: TSH: 4.61 u[IU]/mL — ABNORMAL HIGH (ref 0.450–4.500)

## 2022-06-06 LAB — LIPID PANEL
Chol/HDL Ratio: 3.4 ratio (ref 0.0–4.4)
Cholesterol, Total: 161 mg/dL (ref 100–199)
HDL: 48 mg/dL (ref >39)
LDL Chol Calc (NIH): 96 mg/dL (ref 0–99)
Triglycerides: 92 mg/dL (ref 0–149)
VLDL Cholesterol Cal: 17 mg/dL (ref 5–40)

## 2022-06-06 LAB — T4, FREE: Free T4: 0.83 ng/dL (ref 0.82–1.77)

## 2022-06-06 LAB — VITAMIN D 25 HYDROXY (VIT D DEFICIENCY, FRACTURES): Vit D, 25-Hydroxy: 23 ng/mL — ABNORMAL LOW (ref 30.0–100.0)

## 2022-06-12 ENCOUNTER — Ambulatory Visit (INDEPENDENT_AMBULATORY_CARE_PROVIDER_SITE_OTHER): Payer: Medicare Other | Admitting: Nurse Practitioner

## 2022-06-12 ENCOUNTER — Encounter: Payer: Self-pay | Admitting: Nurse Practitioner

## 2022-06-12 VITALS — BP 138/77 | HR 108 | Ht 60.0 in | Wt 216.0 lb

## 2022-06-12 DIAGNOSIS — N1831 Chronic kidney disease, stage 3a: Secondary | ICD-10-CM | POA: Diagnosis not present

## 2022-06-12 DIAGNOSIS — I10 Essential (primary) hypertension: Secondary | ICD-10-CM

## 2022-06-12 DIAGNOSIS — E782 Mixed hyperlipidemia: Secondary | ICD-10-CM

## 2022-06-12 DIAGNOSIS — Z794 Long term (current) use of insulin: Secondary | ICD-10-CM | POA: Diagnosis not present

## 2022-06-12 DIAGNOSIS — E559 Vitamin D deficiency, unspecified: Secondary | ICD-10-CM

## 2022-06-12 DIAGNOSIS — R7989 Other specified abnormal findings of blood chemistry: Secondary | ICD-10-CM

## 2022-06-12 DIAGNOSIS — E1122 Type 2 diabetes mellitus with diabetic chronic kidney disease: Secondary | ICD-10-CM | POA: Diagnosis not present

## 2022-06-12 LAB — POCT GLYCOSYLATED HEMOGLOBIN (HGB A1C): HbA1c POC (<> result, manual entry): 7.6 % (ref 4.0–5.6)

## 2022-06-12 MED ORDER — HUMULIN R U-500 KWIKPEN 500 UNIT/ML ~~LOC~~ SOPN: PEN_INJECTOR | SUBCUTANEOUS | 1 refills | Status: DC

## 2022-06-12 NOTE — Progress Notes (Signed)
06/12/2022  Endocrinology follow-up note   Subjective:    Patient ID: Ashley Blanchard, female    DOB: [birth year].   She is being seen in follow-up for history of uncontrolled type 2 diabetes, hyperlipidemia, hypertension. She is accompanied by nursing staff who is not immersed in her medical condition.  PMD:   Carrolyn Meiers, MD  Past Medical History:  Diagnosis Date   Chronic anemia    Diabetes mellitus, type II ([place])    Hearing loss    Hyperlipidemia    Hypertension    Mild mental retardation    Past Surgical History:  Procedure Laterality Date   CATARACT EXTRACTION     RETINAL DETACHMENT SURGERY     Social History   Socioeconomic History   Marital status: Single    Spouse name: Not on file   Number of children: Not on file   Years of education: Not on file   Highest education level: Not on file  Occupational History   Not on file  Tobacco Use   Smoking status: Never   Smokeless tobacco: Never  Vaping Use   Vaping Use: Never used  Substance and Sexual Activity   Alcohol use: No   Drug use: No   Sexual activity: Never    Birth control/protection: Post-menopausal  Other Topics Concern   Not on file  Social History Narrative   Not on file   Social Determinants of Health   Financial Resource Strain: Not on file  Food Insecurity: Not on file  Transportation Needs: Not on file  Physical Activity: Not on file  Stress: Not on file  Social Connections: Not on file   Outpatient Encounter Medications as of 06/12/2022  Medication Sig   ALLERGY RELIEF 10 MG tablet Take 10 mg by mouth daily.   aspirin EC 81 MG tablet Take 81 mg by mouth daily.   atorvastatin (LIPITOR) 40 MG tablet Take 1 tablet (40 mg total) by mouth daily.   benzonatate (TESSALON) 100 MG capsule Take by mouth.   diltiazem (CARDIZEM CD) 180 MG 24 hr capsule Take 180 mg by mouth daily.   furosemide (LASIX) 20 MG tablet Take 20 mg by mouth.   GLIPIZIDE XL 5 MG 24 hr tablet TAKE 1 TABLET BY  MOUTH DAILY AT BREAKFAST.   metFORMIN (GLUCOPHAGE) 500 MG tablet Take by mouth 2 (two) times daily with a meal.   ramipril (ALTACE) 5 MG capsule Take 5 mg by mouth daily.   risperiDONE (RISPERDAL) 1 MG tablet Take 1 mg by mouth at bedtime.   sertraline (ZOLOFT) 100 MG tablet Take 100 mg by mouth daily.   TRADJENTA 5 MG TABS tablet Take 5 mg by mouth daily.   traZODone (DESYREL) 50 MG tablet Take 50 mg by mouth at bedtime.   [DISCONTINUED] HUMULIN R U-500 KWIKPEN 500 UNIT/ML KwikPen INJECT 70 UNITS SUBCUTANEOUSLY AT BREAKFAST & LUNCH (IF BS>90 & EATING): ( NOTIFY MD IF BS>400) INJECT 40 UNITS AT DINNER.   Dextrose, Diabetic Use, (TRUEPLUS GLUCOSE PO) Take by mouth.   DROPSAFE SAFETY PEN NEEDLES 31G X 6 MM MISC USE TO INJECT INSULIN FOUR TIMES DAILY   EASYMAX TEST test strip USE TO CHECK BLOOD SUGAR 4 TIMES DAILY BEFORE BREAKFAST,LUNCH,SUPPER AND AT BEDTIME.(HOLD IF BS<70:CALL MD IF BS>400)   FEROSUL 325 (65 Fe) MG tablet Take by mouth. (Patient not taking: Reported on 06/12/2022)   FORA Lancets MISC USE TO CHECK BLOOD SUGAR 4 TIMES DAILY BEFORE BREAKFAST,LUNCH,SUPPER AND AT BEDTIME.(HOLD IF BS<70:CALL MD IF  BS>400)   insulin regular human CONCENTRATED (HUMULIN R U-500 KWIKPEN) 500 UNIT/ML KwikPen Inject 70 units with breakfast and lunch and 30 units with supper if glucose is above 90 and she is eating.   TRUEPLUS GLUCOSE ON THE GO 4 g chewable tablet Chew by mouth.   No facility-administered encounter medications on file as of 06/12/2022.   ALLERGIES: No Known Allergies VACCINATION STATUS:  There is no immunization history on file for this patient.  Diabetes She presents for her follow-up diabetic visit. She has type 2 diabetes mellitus. Onset time: She is not sure when she was diagnosed with diabetes. Her disease course has been stable. Hypoglycemia symptoms include sweats and tremors. Pertinent negatives for hypoglycemia include no confusion, headaches, pallor or seizures. Pertinent negatives  for diabetes include no chest pain, no fatigue, no polydipsia, no polyphagia and no polyuria. There are no hypoglycemic complications. Symptoms are stable. Diabetic complications include nephropathy. Risk factors for coronary artery disease include diabetes mellitus, hypertension, dyslipidemia, obesity and sedentary lifestyle. Current diabetic treatment includes intensive insulin program and oral agent (triple therapy). She is compliant with treatment most of the time (She is in a group home due to failure to care for self.). Her weight is stable. She is following a generally unhealthy diet. When asked about meal planning, she reported none. She has not had a previous visit with a dietitian. She rarely participates in exercise. Her home blood glucose trend is fluctuating dramatically. (She presents today, accompanied by care attendant from the nursing home, with her logs showing fluctuating glycemic profile with above target readings during the morning and afternoon, and tight readings before bed.  Her POCT A1c today is 7.6%, unchanged from last visit.  They did document bedtime hypoglycemia on several different occasions.) An ACE inhibitor/angiotensin II receptor blocker is being taken. She sees a podiatrist.Eye exam is current.  Hypertension This is a chronic problem. The current episode started more than 1 year ago. The problem has been gradually improving since onset. The problem is controlled. Associated symptoms include sweats. Pertinent negatives include no chest pain, headaches, palpitations or shortness of breath. There are no associated agents to hypertension. Risk factors for coronary artery disease include dyslipidemia, diabetes mellitus, obesity and sedentary lifestyle. Past treatments include ACE inhibitors and diuretics. The current treatment provides moderate improvement. There are no compliance problems.  Hypertensive end-organ damage includes kidney disease. Identifiable causes of hypertension  include chronic renal disease.  Hyperlipidemia This is a chronic problem. The current episode started more than 1 year ago. The problem is uncontrolled. Recent lipid tests were reviewed and are high. Exacerbating diseases include chronic renal disease, diabetes and obesity. Pertinent negatives include no chest pain, myalgias or shortness of breath. Current antihyperlipidemic treatment includes statins. The current treatment provides mild improvement of lipids. Compliance problems include adherence to exercise.  Risk factors for coronary artery disease include diabetes mellitus, dyslipidemia, hypertension, obesity and a sedentary lifestyle.    Review of systems  Constitutional: + stable body weight,  current Body mass index is 42.18 kg/m. , no fatigue, no subjective hyperthermia, no subjective hypothermia Eyes: no blurry vision, no xerophthalmia ENT: no sore throat, no nodules palpated in throat, no dysphagia/odynophagia, no hoarseness Cardiovascular: no chest pain, no shortness of breath, no palpitations, no leg swelling Respiratory: no cough, no shortness of breath Gastrointestinal: no nausea/vomiting/diarrhea Musculoskeletal: no muscle/joint aches Skin: no rashes, no hyperemia Neurological: no tremors, no numbness, no tingling, no dizziness Psychiatric: no depression, no anxiety, mild MR  Objective:  BP 138/77   Pulse (!) 108   Ht 5' (1.524 m)   Wt 216 lb (98 kg)   LMP  (LMP Unknown)   BMI 42.18 kg/m   Wt Readings from Last 3 Encounters:  06/12/22 216 lb (98 kg)  02/06/22 216 lb 3.2 oz (98.1 kg)  10/09/21 215 lb (97.5 kg)   BP Readings from Last 3 Encounters:  06/12/22 138/77  02/06/22 115/76  10/09/21 129/84     Physical Exam- Limited  Constitutional:  Body mass index is 42.18 kg/m. , not in acute distress, mild MR, not conversational- nods and gestures appropriately Eyes:  EOMI, no exophthalmos Neck: Supple Cardiovascular: RRR, no murmurs, rubs, or gallops, no  edema Respiratory: Adequate breathing efforts, no crackles, rales, rhonchi, or wheezing Musculoskeletal: no gross deformities, strength intact in all four extremities, no gross restriction of joint movements Skin:  no rashes, no hyperemia Neurological: no tremor with outstretched hands   Diabetic Foot Exam - Simple   No data filed      Lipid Panel     Component Value Date/Time   CHOL 161 06/05/2022 0826   TRIG 92 06/05/2022 0826   HDL 48 06/05/2022 0826   CHOLHDL 3.4 06/05/2022 0826   CHOLHDL 4.0 05/19/2020 0820   LDLCALC 96 06/05/2022 0826   LDLCALC 129 (H) 05/19/2020 0820   Recent Results (from the past 2160 hour(s))  Comprehensive metabolic panel     Status: Abnormal   Collection Time: 06/05/22  8:26 AM  Result Value Ref Range   Glucose 107 (H) 70 - 99 mg/dL   BUN 20 6 - 24 mg/dL   Creatinine, Ser 1.26 (H) 0.57 - 1.00 mg/dL   eGFR 49 (L) >59 mL/min/1.73   BUN/Creatinine Ratio 16 9 - 23   Sodium 140 134 - 144 mmol/L   Potassium 4.7 3.5 - 5.2 mmol/L   Chloride 101 96 - 106 mmol/L   CO2 26 20 - 29 mmol/L   Calcium 10.3 (H) 8.7 - 10.2 mg/dL   Total Protein 6.0 6.0 - 8.5 g/dL   Albumin 3.7 (L) 3.8 - 4.9 g/dL   Globulin, Total 2.3 1.5 - 4.5 g/dL   Albumin/Globulin Ratio 1.6 1.2 - 2.2   Bilirubin Total 0.2 0.0 - 1.2 mg/dL   Alkaline Phosphatase 97 44 - 121 IU/L   AST 16 0 - 40 IU/L   ALT 15 0 - 32 IU/L  Lipid panel     Status: None   Collection Time: 06/05/22  8:26 AM  Result Value Ref Range   Cholesterol, Total 161 100 - 199 mg/dL   Triglycerides 92 0 - 149 mg/dL   HDL 48 >39 mg/dL   VLDL Cholesterol Cal 17 5 - 40 mg/dL   LDL Chol Calc (NIH) 96 0 - 99 mg/dL   Chol/HDL Ratio 3.4 0.0 - 4.4 ratio    Comment:                                   T. Chol/HDL Ratio                                             Men  Women  1/2 Avg.Risk  3.4    3.3                                   Avg.Risk  5.0    4.4                                2X Avg.Risk   9.6    7.1                                3X Avg.Risk 23.4   11.0   TSH     Status: Abnormal   Collection Time: 06/05/22  8:26 AM  Result Value Ref Range   TSH 4.610 (H) 0.450 - 4.500 uIU/mL  T4, free     Status: None   Collection Time: 06/05/22  8:26 AM  Result Value Ref Range   Free T4 0.83 0.82 - 1.77 ng/dL  VITAMIN D 25 Hydroxy (Vit-D Deficiency, Fractures)     Status: Abnormal   Collection Time: 06/05/22  8:26 AM  Result Value Ref Range   Vit D, 25-Hydroxy 23.0 (L) 30.0 - 100.0 ng/mL    Comment: Vitamin D deficiency has been defined by the [place] and an Endocrine Society practice guideline as a level of serum 25-OH vitamin D less than 20 ng/mL (1,2). The Endocrine Society went on to further define vitamin D insufficiency as a level between 21 and 29 ng/mL (2). 1. IOM (Institute of Medicine). 2010. Dietary reference    intakes for calcium and D. [place]: The    [employer]. 2. Holick MF, Binkley McDuffie, Bischoff-Ferrari HA, et al.    Evaluation, treatment, and prevention of vitamin D    deficiency: an Endocrine Society clinical practice    guideline. JCEM. 2011 Jul; 96(7):1911-30.   HgB A1c     Status: Abnormal   Collection Time: 06/12/22  9:56 AM  Result Value Ref Range   Hemoglobin A1C     HbA1c POC (<> result, manual entry) 7.6 4.0 - 5.6 %   HbA1c, POC (prediabetic range)     HbA1c, POC (controlled diabetic range)        Assessment & Plan:   1) Uncontrolled type 2 diabetes mellitus with complication, without long-term current use of insulin ([place])  She presents today, accompanied by care attendant from the nursing home, with her logs showing fluctuating glycemic profile with above target readings during the morning and afternoon, and tight readings before bed.  Her POCT A1c today is 7.6%, unchanged from last visit.  They did document bedtime hypoglycemia on several different occasions.  - She has complications from diabetes including  CKD however patient remains at a high risk for more acute and chronic complications of diabetes which include CAD, CVA, CKD, retinopathy, and neuropathy.   -  She does not understand the high risk she has, these are all discussed with her care manager from the group home.  - Nutritional counseling repeated at each appointment due to patients tendency to fall back in to old habits.  - The patient admits there is a room for improvement in their diet and drink choices. -  Suggestion is made for the patient to avoid simple carbohydrates from their diet including Cakes, Sweet Desserts / Pastries, Ice Cream, Soda (diet and regular),  Sweet Tea, Candies, Chips, Cookies, Sweet Pastries, Store Bought Juices, Alcohol in Excess of 1-2 drinks a day, Artificial Sweeteners, Coffee Creamer, and "Sugar-free" Products. This will help patient to have stable blood glucose profile and potentially avoid unintended weight gain.   - I encouraged the patient to switch to unprocessed or minimally processed complex starch and increased protein intake (animal or plant source), fruits, and vegetables.   - Patient is advised to stick to a routine mealtimes to eat 3 meals a day and avoid unnecessary snacks (to snack only to correct hypoglycemia).  - I have approached patient with the following individualized plan to manage diabetes and patient agrees:   -Her unlimited access to sweetened beverages is making control of diabetes nearly impossible.  I did specifically write out recommendations to avoid sweetened beverages to the facility today.  - Number 1 priority in her case is to avoid hypoglycemia.  -She will continue to need intensive treatment with higher dose of insulin in order for her to achieve and maintain control of diabetes to target.   -She is advised to continue Humulin R- U500 at 70 units with breakfast and lunch and lower her dinner dose to 30 units, if glucose is above 90 and she is eating.  She is advised to  continue Metformin 500 mg po twice daily with meals, continue Glipizide 5 mg XL daily with breakfast, and Tradjenta 5 mg po daily.   -She is encouraged to continue monitoring blood glucose 4 times daily, before meals and before bed, and to call the clinic if she has readings less than 70 or greater than 300 for 3 tests in a row.  - She is not suitable candidate for SGLT2 inhibitors.  - Patient specific target  A1c;  LDL, HDL, Triglycerides,  were discussed in detail.  2) BP/HTN :  Her blood pressure is controlled to target.   She is advised to continue her current blood pressure medications including Ramipril 5 mg p.o. daily with breakfast, and Lasix 20 mg po daily.    3) Lipids/HPL:  Her most recent lipid panel from 06/05/22 shows controlled but improved LDL of 96.  She is advised to continue Lipitor 40 mg po daily at bedtime.  Side effects and precautions discussed with her.    4) Vitamin D deficiency:  Her most recent vitamin D level was 23 on 06/05/22.  She does not need to start on any supplementation at this time.    5)  Weight/Diet:  Her Body mass index is 42.18 kg/m.  This clearly is complicating her diabetes care.  She is a candidate for modest weight loss.  She cannot exercise optimally.   Exercise, and detailed carbohydrates information provided.  6) Abnormal TSH Her recent thyroid labs are suggestive of hypothyroidism.  She is not currently on any thyroid supplements or antithyroid medications.  No need for intervention at this time.  Will monitor with repeat thyroid labs on subsequent visits.  7) Chronic Care/Health Maintenance: -Patient is on ACEI/ARB and Statin medications and encouraged to continue to follow up with Ophthalmology, Podiatrist at least yearly or according to recommendations, and advised to   stay away from smoking. I have recommended yearly flu vaccine and pneumonia vaccination at least every 5 years;  60 minutes of walking 4 days a week is recommended for her ,  and  sleep for at least 7 hours a day.   - I advised patient to maintain close follow up with Carrolyn Meiers, MD for  primary care needs.      I spent 30 minutes in the care of the patient today including review of labs from [place], Lipids, Thyroid Function, Hematology (current and previous including abstractions from other facilities); face-to-face time discussing  her blood glucose readings/logs, discussing hypoglycemia and hyperglycemia episodes and symptoms, medications doses, her options of short and long term treatment based on the latest standards of care / guidelines;  discussion about incorporating lifestyle medicine;  and documenting the encounter. Risk reduction counseling performed per USPSTF guidelines to reduce obesity and cardiovascular risk factors.     Please refer to Patient Instructions for Blood Glucose Monitoring and Insulin/Medications Dosing Guide"  in media tab for additional information. Please  also refer to " Patient Self Inventory" in the Media  tab for reviewed elements of pertinent patient history.  Jodee Baena participated in the discussions, expressed understanding, and voiced agreement with the above plans.  All questions were answered to her satisfaction. she is encouraged to contact clinic should she have any questions or concerns prior to her return visit.   Follow up plan: - Return in about 4 months (around 10/12/2022) for Diabetes F/U with A1c in office, No previsit labs, Bring meter and logs.  Rayetta Pigg, [place] [facility] [address] [place], [place] [address] Phone: [phone] Fax: [phone]  06/12/2022, 10:10 AM

## 2022-06-14 ENCOUNTER — Ambulatory Visit (HOSPITAL_COMMUNITY)
Admission: RE | Admit: 2022-06-14 | Discharge: 2022-06-14 | Disposition: A | Discharge status: Home / Self Care | Payer: Medicare Other | Source: Ambulatory Visit | Attending: Internal Medicine | Admitting: Internal Medicine

## 2022-06-14 DIAGNOSIS — Z1231 Encounter for screening mammogram for malignant neoplasm of breast: Secondary | ICD-10-CM | POA: Insufficient documentation

## 2022-06-27 ENCOUNTER — Other Ambulatory Visit (HOSPITAL_COMMUNITY): Payer: Self-pay | Admitting: Gerontology

## 2022-06-27 DIAGNOSIS — M25562 Pain in left knee: Secondary | ICD-10-CM

## 2022-06-28 ENCOUNTER — Ambulatory Visit (HOSPITAL_COMMUNITY)
Admission: RE | Admit: 2022-06-28 | Discharge: 2022-06-28 | Disposition: A | Discharge status: Home / Self Care | Payer: Medicare Other | Source: Ambulatory Visit | Attending: Gerontology | Admitting: Gerontology

## 2022-06-28 DIAGNOSIS — M25562 Pain in left knee: Secondary | ICD-10-CM | POA: Diagnosis not present

## 2022-07-27 ENCOUNTER — Other Ambulatory Visit: Payer: Self-pay | Admitting: Nurse Practitioner

## 2022-09-07 ENCOUNTER — Other Ambulatory Visit: Payer: Self-pay | Admitting: Nurse Practitioner

## 2022-09-12 ENCOUNTER — Other Ambulatory Visit: Payer: Self-pay | Admitting: "Endocrinology

## 2022-10-12 ENCOUNTER — Ambulatory Visit: Payer: Medicare Other | Admitting: Nurse Practitioner

## 2022-10-15 ENCOUNTER — Ambulatory Visit (INDEPENDENT_AMBULATORY_CARE_PROVIDER_SITE_OTHER): Payer: Medicare Other | Admitting: Nurse Practitioner

## 2022-10-15 ENCOUNTER — Encounter: Payer: Self-pay | Admitting: Nurse Practitioner

## 2022-10-15 VITALS — BP 128/66 | HR 107 | Ht 60.0 in | Wt 217.2 lb

## 2022-10-15 DIAGNOSIS — I10 Essential (primary) hypertension: Secondary | ICD-10-CM | POA: Diagnosis not present

## 2022-10-15 DIAGNOSIS — E559 Vitamin D deficiency, unspecified: Secondary | ICD-10-CM

## 2022-10-15 DIAGNOSIS — R7989 Other specified abnormal findings of blood chemistry: Secondary | ICD-10-CM

## 2022-10-15 DIAGNOSIS — Z794 Long term (current) use of insulin: Secondary | ICD-10-CM | POA: Diagnosis not present

## 2022-10-15 DIAGNOSIS — E1122 Type 2 diabetes mellitus with diabetic chronic kidney disease: Secondary | ICD-10-CM | POA: Diagnosis not present

## 2022-10-15 DIAGNOSIS — N1831 Chronic kidney disease, stage 3a: Secondary | ICD-10-CM | POA: Diagnosis not present

## 2022-10-15 DIAGNOSIS — E782 Mixed hyperlipidemia: Secondary | ICD-10-CM | POA: Diagnosis not present

## 2022-10-15 LAB — POCT GLYCOSYLATED HEMOGLOBIN (HGB A1C): Hemoglobin A1C: 7.4 % — AB (ref 4.0–5.6)

## 2022-10-15 NOTE — Progress Notes (Signed)
10/15/2022  Endocrinology follow-up note   Subjective:    Patient ID: Ashley Blanchard, female    DOB: [birth year].   She is being seen in follow-up for history of uncontrolled type 2 diabetes, hyperlipidemia, hypertension. She is accompanied by nursing staff who is not immersed in her medical condition.  PMD:   Benetta SparFanta, Tesfaye Demissie, MD  Past Medical History:  Diagnosis Date   Chronic anemia    Diabetes mellitus, type II (HCC)    Hearing loss    Hyperlipidemia    Hypertension    Mild mental retardation    Past Surgical History:  Procedure Laterality Date   CATARACT EXTRACTION     RETINAL DETACHMENT SURGERY     Social History   Socioeconomic History   Marital status: Single    Spouse name: Not on file   Number of children: Not on file   Years of education: Not on file   Highest education level: Not on file  Occupational History   Not on file  Tobacco Use   Smoking status: Never   Smokeless tobacco: Never  Vaping Use   Vaping Use: Never used  Substance and Sexual Activity   Alcohol use: No   Drug use: No   Sexual activity: Never    Birth control/protection: Post-menopausal  Other Topics Concern   Not on file  Social History Narrative   Not on file   Social Determinants of Health   Financial Resource Strain: Not on file  Food Insecurity: Not on file  Transportation Needs: Not on file  Physical Activity: Not on file  Stress: Not on file  Social Connections: Not on file   Outpatient Encounter Medications as of 10/15/2022  Medication Sig   ALLERGY RELIEF 10 MG tablet Take 10 mg by mouth daily.   aspirin EC 81 MG tablet Take 81 mg by mouth daily.   atorvastatin (LIPITOR) 40 MG tablet Take 1 tablet (40 mg total) by mouth daily.   benzonatate (TESSALON) 100 MG capsule Take by mouth.   Dextrose, Diabetic Use, (TRUEPLUS GLUCOSE PO) Take by mouth.   diltiazem (CARDIZEM CD) 180 MG 24 hr capsule Take 180 mg by mouth daily.   DROPSAFE SAFETY PEN NEEDLES 31G X 6 MM  MISC USE TO INJECT INSULIN THREE TIMES DAILY.   EASYMAX TEST test strip USE TO CHECK BLOOD SUGAR 4 TIMES DAILY BEFORE BREAKFAST,LUNCH,SUPPER AND AT BEDTIME.(HOLD IF BS<70:CALL MD IF BS>400)   FEROSUL 325 (65 Fe) MG tablet Take by mouth.   FORA Lancets MISC USE TO CHECK BLOOD SUGAR 4 TIMES DAILY BEFORE BREAKFAST,LUNCH,SUPPER AND AT BEDTIME.(HOLD IF BS<70:CALL MD IF BS>400)   furosemide (LASIX) 20 MG tablet Take 20 mg by mouth.   GLIPIZIDE XL 5 MG 24 hr tablet TAKE 1 TABLET BY MOUTH DAILY AT BREAKFAST.   HUMULIN R U-500 KWIKPEN 500 UNIT/ML KwikPen INJECT 70 UNITS [place] WITH BREAKFAST & LUNCH IF GLUCOSE IS ABOVE 90 & EATING. INJECT 30 UNITS [place] WITH SUPPER IF GLUCOSE IS ABOVE 90 & EATING.   metFORMIN (GLUCOPHAGE) 500 MG tablet Take by mouth 2 (two) times daily with a meal.   ramipril (ALTACE) 5 MG capsule Take 5 mg by mouth daily.   risperiDONE (RISPERDAL) 1 MG tablet Take 1 mg by mouth at bedtime.   sertraline (ZOLOFT) 100 MG tablet Take 100 mg by mouth daily.   TRADJENTA 5 MG TABS tablet Take 5 mg by mouth daily.   traZODone (DESYREL) 50 MG tablet Take 50 mg by mouth at bedtime.  TRUEPLUS GLUCOSE ON THE GO 4 g chewable tablet Chew by mouth.   No facility-administered encounter medications on file as of 10/15/2022.   ALLERGIES: No Known Allergies VACCINATION STATUS:  There is no immunization history on file for this patient.  Diabetes She presents for her follow-up diabetic visit. She has type 2 diabetes mellitus. Onset time: She is not sure when she was diagnosed with diabetes. Her disease course has been improving. There are no hypoglycemic associated symptoms. Pertinent negatives for hypoglycemia include no confusion, headaches, pallor or seizures. Pertinent negatives for diabetes include no chest pain, no fatigue, no polydipsia, no polyphagia and no polyuria. There are no hypoglycemic complications. Symptoms are stable. Diabetic complications include nephropathy. Risk factors for coronary  artery disease include diabetes mellitus, hypertension, dyslipidemia, obesity and sedentary lifestyle. Current diabetic treatment includes intensive insulin program and oral agent (triple therapy). She is compliant with treatment most of the time (She is in a group home due to failure to care for self.). Her weight is stable. She is following a generally unhealthy diet. When asked about meal planning, she reported none. She has not had a previous visit with a dietitian. She rarely participates in exercise. Her home blood glucose trend is fluctuating dramatically. (She presents today, accompanied by care attendant from the nursing home, with her logs showing fluctuating glycemic profile overall but no significant hypoglycemia.  Her POCT A1c today is 7.4%, improving from last visit of 7.6%.  ) An ACE inhibitor/angiotensin II receptor blocker is being taken. She sees a podiatrist.Eye exam is current.  Hypertension This is a chronic problem. The current episode started more than 1 year ago. The problem has been gradually improving since onset. The problem is controlled. Pertinent negatives include no chest pain, headaches, palpitations or shortness of breath. There are no associated agents to hypertension. Risk factors for coronary artery disease include dyslipidemia, diabetes mellitus, obesity and sedentary lifestyle. Past treatments include ACE inhibitors and diuretics. The current treatment provides moderate improvement. There are no compliance problems.  Hypertensive end-organ damage includes kidney disease. Identifiable causes of hypertension include chronic renal disease.  Hyperlipidemia This is a chronic problem. The current episode started more than 1 year ago. The problem is uncontrolled. Recent lipid tests were reviewed and are high. Exacerbating diseases include chronic renal disease, diabetes and obesity. Pertinent negatives include no chest pain, myalgias or shortness of breath. Current  antihyperlipidemic treatment includes statins. The current treatment provides mild improvement of lipids. Compliance problems include adherence to exercise.  Risk factors for coronary artery disease include diabetes mellitus, dyslipidemia, hypertension, obesity and a sedentary lifestyle.    Review of systems  Constitutional: + stable body weight,  current Body mass index is 42.42 kg/m. , no fatigue, no subjective hyperthermia, no subjective hypothermia Eyes: no blurry vision, no xerophthalmia ENT: no sore throat, no nodules palpated in throat, no dysphagia/odynophagia, no hoarseness Cardiovascular: no chest pain, no shortness of breath, no palpitations, no leg swelling Respiratory: no cough, no shortness of breath Gastrointestinal: no nausea/vomiting/diarrhea Musculoskeletal: no muscle/joint aches Skin: no rashes, no hyperemia Neurological: no tremors, no numbness, no tingling, no dizziness Psychiatric: no depression, no anxiety, mild MR  Objective:    BP 128/66 (BP Location: Left Arm, Patient Position: Sitting, Cuff Size: Large)   Pulse (!) 107   Ht 5' (1.524 m)   Wt 217 lb 3.4 oz (98.5 kg)   LMP  (LMP Unknown)   BMI 42.42 kg/m   Wt Readings from Last 3 Encounters:  10/15/22  217 lb 3.4 oz (98.5 kg)  06/12/22 216 lb (98 kg)  02/06/22 216 lb 3.2 oz (98.1 kg)   BP Readings from Last 3 Encounters:  10/15/22 128/66  06/12/22 138/77  02/06/22 115/76     Physical Exam- Limited  Constitutional:  Body mass index is 42.42 kg/m. , not in acute distress, mild MR, not conversational- nods and gestures appropriately Eyes:  EOMI, no exophthalmos Neck: Supple Cardiovascular: RRR, no murmurs, rubs, or gallops, no edema Respiratory: Adequate breathing efforts, no crackles, rales, rhonchi, or wheezing Musculoskeletal: no gross deformities, strength intact in all four extremities, no gross restriction of joint movements Skin:  no rashes, no hyperemia Neurological: no tremor with  outstretched hands   Diabetic Foot Exam - Simple   No data filed      Lipid Panel     Component Value Date/Time   CHOL 161 06/05/2022 0826   TRIG 92 06/05/2022 0826   HDL 48 06/05/2022 0826   CHOLHDL 3.4 06/05/2022 0826   CHOLHDL 4.0 05/19/2020 0820   LDLCALC 96 06/05/2022 0826   LDLCALC 129 (H) 05/19/2020 0820   Recent Results (from the past 2160 hour(s))  HgB A1c     Status: Abnormal   Collection Time: 10/15/22  9:47 AM  Result Value Ref Range   Hemoglobin A1C 7.4 (A) 4.0 - 5.6 %   HbA1c POC (<> result, manual entry)     HbA1c, POC (prediabetic range)     HbA1c, POC (controlled diabetic range)        Assessment & Plan:   1) Controlled type 2 diabetes mellitus with complication, without long-term current use of insulin (HCC)  She presents today, accompanied by care attendant from the nursing home, with her logs showing fluctuating glycemic profile overall but no significant hypoglycemia.  Her POCT A1c today is 7.4%, improving from last visit of 7.6%.    - She has complications from diabetes including CKD however patient remains at a high risk for more acute and chronic complications of diabetes which include CAD, CVA, CKD, retinopathy, and neuropathy.   -  She does not understand the high risk she has, these are all discussed with her care manager from the group home.  - Nutritional counseling repeated at each appointment due to patients tendency to fall back in to old habits.  - The patient admits there is a room for improvement in their diet and drink choices. -  Suggestion is made for the patient to avoid simple carbohydrates from their diet including Cakes, Sweet Desserts / Pastries, Ice Cream, Soda (diet and regular), Sweet Tea, Candies, Chips, Cookies, Sweet Pastries, Store Bought Juices, Alcohol in Excess of 1-2 drinks a day, Artificial Sweeteners, Coffee Creamer, and "Sugar-free" Products. This will help patient to have stable blood glucose profile and  potentially avoid unintended weight gain.   - I encouraged the patient to switch to unprocessed or minimally processed complex starch and increased protein intake (animal or plant source), fruits, and vegetables.   - Patient is advised to stick to a routine mealtimes to eat 3 meals a day and avoid unnecessary snacks (to snack only to correct hypoglycemia).  - I have approached patient with the following individualized plan to manage diabetes and patient agrees:   -Her unlimited access to sweetened beverages is making control of diabetes nearly impossible.  I did specifically write out recommendations to avoid sweetened beverages at last visit.  - Number 1 priority in her case is to avoid hypoglycemia.  -She will  continue to need intensive treatment with higher dose of insulin in order for her to achieve and maintain control of diabetes to target.   -Given her stable glycemic profile and lack of hypoglycemia, no changes will be made to her medications today.  She is advised to continue Humulin R- U500 at 70 units with breakfast and lunch and 30 units with supper, if glucose is above 90 and she is eating.  She is advised to continue Metformin 500 mg po twice daily with meals, continue Glipizide 5 mg XL daily with breakfast, and Tradjenta 5 mg po daily.   -She is encouraged to continue monitoring blood glucose 4 times daily, before meals and before bed, and to call the clinic if she has readings less than 70 or greater than 300 for 3 tests in a row.  - She is not suitable candidate for SGLT2 inhibitors.  - Patient specific target  A1c;  LDL, HDL, Triglycerides,  were discussed in detail.  2) BP/HTN :  Her blood pressure is controlled to target.   She is advised to continue her current blood pressure medications including Ramipril 5 mg p.o. daily with breakfast, and Lasix 20 mg po daily.    3) Lipids/HPL:  Her most recent lipid panel from 06/05/22 shows controlled but improved LDL of 96.  She is  advised to continue Lipitor 40 mg po daily at bedtime.  Side effects and precautions discussed with her.    4) Vitamin D deficiency:  Her most recent vitamin D level was 23 on 06/05/22.  She does not need to start on any supplementation at this time.    5)  Weight/Diet:  Her Body mass index is 42.42 kg/m.  This clearly is complicating her diabetes care.  She is a candidate for modest weight loss.  She cannot exercise optimally.   Exercise, and detailed carbohydrates information provided.  6) Abnormal TSH Her recent thyroid labs are suggestive of hypothyroidism.  She is not currently on any thyroid supplements or antithyroid medications.  No need for intervention at this time.  Will monitor with repeat thyroid labs on subsequent visits.  7) Chronic Care/Health Maintenance: -Patient is on ACEI/ARB and Statin medications and encouraged to continue to follow up with Ophthalmology, Podiatrist at least yearly or according to recommendations, and advised to   stay away from smoking. I have recommended yearly flu vaccine and pneumonia vaccination at least every 5 years;  60 minutes of walking 4 days a week is recommended for her , and  sleep for at least 7 hours a day.   - I advised patient to maintain close follow up with Benetta Spar, MD for primary care needs.     I spent 30 minutes in the care of the patient today including review of labs from CMP, Lipids, Thyroid Function, Hematology (current and previous including abstractions from other facilities); face-to-face time discussing  her blood glucose readings/logs, discussing hypoglycemia and hyperglycemia episodes and symptoms, medications doses, her options of short and long term treatment based on the latest standards of care / guidelines;  discussion about incorporating lifestyle medicine;  and documenting the encounter. Risk reduction counseling performed per USPSTF guidelines to reduce obesity and cardiovascular risk factors.      Please refer to Patient Instructions for Blood Glucose Monitoring and Insulin/Medications Dosing Guide"  in media tab for additional information. Please  also refer to " Patient Self Inventory" in the Media  tab for reviewed elements of pertinent patient history.  Ashley  Blanchard participated in the discussions, expressed understanding, and voiced agreement with the above plans.  All questions were answered to her satisfaction. she is encouraged to contact clinic should she have any questions or concerns prior to her return visit.   Follow up plan: - Return in about 4 months (around 02/14/2023) for Diabetes F/U with A1c in office, No previsit labs, Bring meter and logs.  Ronny Bacon, [facility] [address] [place], [place] [address] Phone: [phone] Fax: [phone]  10/15/2022, 10:13 AM

## 2022-10-23 ENCOUNTER — Other Ambulatory Visit: Payer: Self-pay | Admitting: "Endocrinology

## 2022-11-22 ENCOUNTER — Other Ambulatory Visit: Payer: Self-pay | Admitting: "Endocrinology

## 2022-11-28 ENCOUNTER — Telehealth: Payer: Self-pay | Admitting: Nurse Practitioner

## 2022-11-28 NOTE — Telephone Encounter (Signed)
New message   [facility] calling fax over out of range blood sugar over 300 on 1/5 and next week after .    Asking for a call back

## 2022-11-28 NOTE — Telephone Encounter (Signed)
I did not see the form that was faxed over on 11/16/2022. Whitney was not sure. I called [facility] back and spoke with Baker Janus. I ask that they refax this so that Whitney can see it and respond.

## 2022-11-30 ENCOUNTER — Other Ambulatory Visit (HOSPITAL_COMMUNITY): Payer: Self-pay | Admitting: Gerontology

## 2022-11-30 ENCOUNTER — Ambulatory Visit (HOSPITAL_COMMUNITY)
Admission: RE | Admit: 2022-11-30 | Discharge: 2022-11-30 | Disposition: A | Discharge status: Home / Self Care | Payer: Medicare Other | Source: Ambulatory Visit | Attending: Gerontology | Admitting: Gerontology

## 2022-11-30 DIAGNOSIS — R059 Cough, unspecified: Secondary | ICD-10-CM | POA: Diagnosis present

## 2022-12-28 ENCOUNTER — Other Ambulatory Visit: Payer: Self-pay | Admitting: Nurse Practitioner

## 2022-12-28 ENCOUNTER — Other Ambulatory Visit: Payer: Self-pay | Admitting: "Endocrinology

## 2023-02-05 ENCOUNTER — Other Ambulatory Visit: Payer: Self-pay | Admitting: Nurse Practitioner

## 2023-02-05 ENCOUNTER — Other Ambulatory Visit: Payer: Self-pay | Admitting: "Endocrinology

## 2023-02-14 ENCOUNTER — Encounter: Payer: Self-pay | Admitting: Nurse Practitioner

## 2023-02-14 ENCOUNTER — Ambulatory Visit (INDEPENDENT_AMBULATORY_CARE_PROVIDER_SITE_OTHER): Payer: Medicare Other | Admitting: Nurse Practitioner

## 2023-02-14 VITALS — BP 136/83 | HR 103 | Ht 60.0 in | Wt 222.0 lb

## 2023-02-14 DIAGNOSIS — I10 Essential (primary) hypertension: Secondary | ICD-10-CM | POA: Diagnosis not present

## 2023-02-14 DIAGNOSIS — E1122 Type 2 diabetes mellitus with diabetic chronic kidney disease: Secondary | ICD-10-CM

## 2023-02-14 DIAGNOSIS — E782 Mixed hyperlipidemia: Secondary | ICD-10-CM

## 2023-02-14 DIAGNOSIS — R7989 Other specified abnormal findings of blood chemistry: Secondary | ICD-10-CM

## 2023-02-14 DIAGNOSIS — Z794 Long term (current) use of insulin: Secondary | ICD-10-CM

## 2023-02-14 DIAGNOSIS — N1831 Chronic kidney disease, stage 3a: Secondary | ICD-10-CM

## 2023-02-14 DIAGNOSIS — E559 Vitamin D deficiency, unspecified: Secondary | ICD-10-CM | POA: Diagnosis not present

## 2023-02-14 LAB — POCT GLYCOSYLATED HEMOGLOBIN (HGB A1C): Hemoglobin A1C: 8.1 % — AB (ref 4.0–5.6)

## 2023-02-14 MED ORDER — HUMULIN R U-500 KWIKPEN 500 UNIT/ML ~~LOC~~ SOPN: PEN_INJECTOR | SUBCUTANEOUS | 1 refills | Status: DC

## 2023-02-14 NOTE — Progress Notes (Signed)
02/14/2023  Endocrinology follow-up note   Subjective:    Patient ID: Ashley Blanchard, female    DOB: [birth year].   She is being seen in follow-up for history of uncontrolled type 2 diabetes, hyperlipidemia, hypertension. She is accompanied by nursing staff who is not immersed in her medical condition.  PMD:   Carrolyn Meiers, MD  Past Medical History:  Diagnosis Date   Chronic anemia    Diabetes mellitus, type II    Hearing loss    Hyperlipidemia    Hypertension    Mild mental retardation    Past Surgical History:  Procedure Laterality Date   CATARACT EXTRACTION     RETINAL DETACHMENT SURGERY     Social History   Socioeconomic History   Marital status: Single    Spouse name: Not on file   Number of children: Not on file   Years of education: Not on file   Highest education level: Not on file  Occupational History   Not on file  Tobacco Use   Smoking status: Never   Smokeless tobacco: Never  Vaping Use   Vaping Use: Never used  Substance and Sexual Activity   Alcohol use: No   Drug use: No   Sexual activity: Never    Birth control/protection: Post-menopausal  Other Topics Concern   Not on file  Social History Narrative   Not on file   Social Determinants of Health   Financial Resource Strain: Not on file  Food Insecurity: Not on file  Transportation Needs: Not on file  Physical Activity: Not on file  Stress: Not on file  Social Connections: Not on file   Outpatient Encounter Medications as of 02/14/2023  Medication Sig   ALLERGY RELIEF 10 MG tablet Take 10 mg by mouth daily.   aspirin EC 81 MG tablet Take 81 mg by mouth daily.   atorvastatin (LIPITOR) 40 MG tablet Take 1 tablet (40 mg total) by mouth daily.   benzonatate (TESSALON) 100 MG capsule Take by mouth.   Dextrose, Diabetic Use, (TRUEPLUS GLUCOSE PO) Take by mouth.   diltiazem (CARDIZEM CD) 180 MG 24 hr capsule Take 180 mg by mouth daily.   DROPSAFE SAFETY PEN NEEDLES 31G X 6 MM MISC USE  TO INJECT INSULIN THREE TIMES DAILY.   EASYMAX TEST test strip USE TO CHECK BLOOD SUGAR 4 TIMES DAILY BEFORE BREAKFAST,LUNCH,SUPPER AND AT BEDTIME.(HOLD IF BS<70:CALL MD IF BS>400)   FEROSUL 325 (65 Fe) MG tablet Take by mouth.   furosemide (LASIX) 20 MG tablet Take 20 mg by mouth.   GLIPIZIDE XL 5 MG 24 hr tablet TAKE 1 TABLET BY MOUTH DAILY AT BREAKFAST.   Lancets 28G MISC USE TO CHECK BLOOD SUGAR 4 TIMES DAILY BEFORE BREAKFAST,LUNCH,SUPPER AND AT BEDTIME.(HOLD IF BS<70:CALL MD IF BS>400)   metFORMIN (GLUCOPHAGE) 500 MG tablet Take by mouth 2 (two) times daily with a meal.   ramipril (ALTACE) 5 MG capsule Take 5 mg by mouth daily.   risperiDONE (RISPERDAL) 1 MG tablet Take 1 mg by mouth at bedtime.   sertraline (ZOLOFT) 100 MG tablet Take 100 mg by mouth daily.   TRADJENTA 5 MG TABS tablet Take 5 mg by mouth daily.   traZODone (DESYREL) 50 MG tablet Take 50 mg by mouth at bedtime.   TRUEPLUS GLUCOSE ON THE GO 4 g chewable tablet Chew by mouth.   [DISCONTINUED] HUMULIN R U-500 KWIKPEN 500 UNIT/ML KwikPen INJECT 70 UNITS [place] WITH BREAKFAST & LUNCH IF GLUCOSE IS ABOVE 90 &  EATING. INJECT 30 UNITS [place] WITH SUPPER IF GLUCOSE IS ABOVE 90 & EATING.   insulin regular human CONCENTRATED (HUMULIN R U-500 KWIKPEN) 500 UNIT/ML KwikPen Inject 80 units with breakfast and lunch and 30 units with supper if glucose above 90 and eating.   No facility-administered encounter medications on file as of 02/14/2023.   ALLERGIES: No Known Allergies VACCINATION STATUS:  There is no immunization history on file for this patient.  Diabetes She presents for her follow-up diabetic visit. She has type 2 diabetes mellitus. Onset time: She is not sure when she was diagnosed with diabetes. Her disease course has been worsening. There are no hypoglycemic associated symptoms. Pertinent negatives for hypoglycemia include no confusion, headaches, pallor or seizures. Pertinent negatives for diabetes include no chest pain, no  fatigue, no polydipsia, no polyphagia and no polyuria. There are no hypoglycemic complications. Symptoms are stable. Diabetic complications include nephropathy. Risk factors for coronary artery disease include diabetes mellitus, hypertension, dyslipidemia, obesity and sedentary lifestyle. Current diabetic treatment includes intensive insulin program and oral agent (triple therapy). She is compliant with treatment most of the time (She is in a group home due to failure to care for self.). Her weight is increasing steadily. She is following a generally unhealthy diet. When asked about meal planning, she reported none. She has not had a previous visit with a dietitian. She rarely participates in exercise. Her home blood glucose trend is increasing steadily. Her breakfast blood glucose range is generally >200 mg/dl. Her lunch blood glucose range is generally >200 mg/dl. Her dinner blood glucose range is generally >200 mg/dl. Her bedtime blood glucose range is generally 140-180 mg/dl. Her overall blood glucose range is >200 mg/dl. (She presents today, accompanied by care attendant from the nursing home, with her logs showing gross hyperglycemia overall.  Her POCT A1c today is 8.1%, increasing from last visit.  Caregiver notes she has been eating snacks between meals and does not like exercising at all.  There are no episodes of hypoglycemia documented.  ) An ACE inhibitor/angiotensin II receptor blocker is being taken. She sees a podiatrist.Eye exam is current.  Hypertension This is a chronic problem. The current episode started more than 1 year ago. The problem has been gradually improving since onset. The problem is controlled. Pertinent negatives include no chest pain, headaches, palpitations or shortness of breath. There are no associated agents to hypertension. Risk factors for coronary artery disease include dyslipidemia, diabetes mellitus, obesity and sedentary lifestyle. Past treatments include ACE inhibitors  and diuretics. The current treatment provides moderate improvement. There are no compliance problems.  Hypertensive end-organ damage includes kidney disease. Identifiable causes of hypertension include chronic renal disease.  Hyperlipidemia This is a chronic problem. The current episode started more than 1 year ago. The problem is uncontrolled. Recent lipid tests were reviewed and are high. Exacerbating diseases include chronic renal disease, diabetes and obesity. Pertinent negatives include no chest pain, myalgias or shortness of breath. Current antihyperlipidemic treatment includes statins. The current treatment provides mild improvement of lipids. Compliance problems include adherence to exercise.  Risk factors for coronary artery disease include diabetes mellitus, dyslipidemia, hypertension, obesity and a sedentary lifestyle.    Review of systems  Constitutional: + steadily increasing body weight,  current Body mass index is 43.36 kg/m. , no fatigue, no subjective hyperthermia, no subjective hypothermia Eyes: no blurry vision, no xerophthalmia ENT: no sore throat, no nodules palpated in throat, no dysphagia/odynophagia, no hoarseness Cardiovascular: no chest pain, no shortness of breath, no  palpitations, no leg swelling Respiratory: no cough, no shortness of breath Gastrointestinal: no nausea/vomiting/diarrhea Musculoskeletal: no muscle/joint aches Skin: no rashes, no hyperemia Neurological: no tremors, no numbness, no tingling, no dizziness Psychiatric: no depression, no anxiety, mild MR  Objective:    BP 136/83 (BP Location: Left Arm, Patient Position: Sitting, Cuff Size: Large)   Pulse (!) 103   Ht 5' (1.524 m)   Wt 222 lb (100.7 kg)   LMP  (LMP Unknown)   BMI 43.36 kg/m   Wt Readings from Last 3 Encounters:  02/14/23 222 lb (100.7 kg)  10/15/22 217 lb 3.4 oz (98.5 kg)  06/12/22 216 lb (98 kg)   BP Readings from Last 3 Encounters:  02/14/23 136/83  10/15/22 128/66  06/12/22  138/77     Physical Exam- Limited  Constitutional:  Body mass index is 43.36 kg/m. , not in acute distress, mild MR, not conversational- nods and gestures appropriately Eyes:  EOMI, no exophthalmos Musculoskeletal: no gross deformities, strength intact in all four extremities, no gross restriction of joint movements Skin:  no rashes, no hyperemia Neurological: no tremor with outstretched hands   Diabetic Foot Exam - Simple   No data filed      Lipid Panel     Component Value Date/Time   CHOL 161 06/05/2022 0826   TRIG 92 06/05/2022 0826   HDL 48 06/05/2022 0826   CHOLHDL 3.4 06/05/2022 0826   CHOLHDL 4.0 05/19/2020 0820   LDLCALC 96 06/05/2022 0826   LDLCALC 129 (H) 05/19/2020 0820   Recent Results (from the past 2160 hour(s))  HgB A1c     Status: Abnormal   Collection Time: 02/14/23 10:14 AM  Result Value Ref Range   Hemoglobin A1C 8.1 (A) 4.0 - 5.6 %   HbA1c POC (<> result, manual entry)     HbA1c, POC (prediabetic range)     HbA1c, POC (controlled diabetic range)        Assessment & Plan:   1) Controlled type 2 diabetes mellitus with complication, without long-term current use of insulin ([place])  She presents today, accompanied by care attendant from the nursing home, with her logs showing gross hyperglycemia overall.  Her POCT A1c today is 8.1%, increasing from last visit.  Caregiver notes she has been eating snacks between meals and does not like exercising at all.  There are no episodes of hypoglycemia documented.  - She has complications from diabetes including CKD however patient remains at a high risk for more acute and chronic complications of diabetes which include CAD, CVA, CKD, retinopathy, and neuropathy.   -  She does not understand the high risk she has, these are all discussed with her care manager from the group home.  - Nutritional counseling repeated at each appointment due to patients tendency to fall back in to old habits.  - The patient  admits there is a room for improvement in their diet and drink choices. -  Suggestion is made for the patient to avoid simple carbohydrates from their diet including Cakes, Sweet Desserts / Pastries, Ice Cream, Soda (diet and regular), Sweet Tea, Candies, Chips, Cookies, Sweet Pastries, Store Bought Juices, Alcohol in Excess of 1-2 drinks a day, Artificial Sweeteners, Coffee Creamer, and "Sugar-free" Products. This will help patient to have stable blood glucose profile and potentially avoid unintended weight gain.   - I encouraged the patient to switch to unprocessed or minimally processed complex starch and increased protein intake (animal or plant source), fruits, and vegetables.   -  Patient is advised to stick to a routine mealtimes to eat 3 meals a day and avoid unnecessary snacks (to snack only to correct hypoglycemia).  - I have approached patient with the following individualized plan to manage diabetes and patient agrees:   -Her unlimited access to sweetened beverages is making control of diabetes nearly impossible.  I did specifically write out recommendations to avoid sweetened beverages at last visit.  - Number 1 priority in her case is to avoid hypoglycemia.  -She will continue to need intensive treatment with higher dose of insulin in order for her to achieve and maintain control of diabetes to target.   -She is advised to increase her Humulin R- U500 to 80 units with breakfast and lunch and continue her 30 units with supper, if glucose is above 90 and she is eating.  She is advised to continue Metformin 500 mg po twice daily with meals, continue Glipizide 5 mg XL daily with breakfast, and Tradjenta 5 mg po daily.   -She is encouraged to continue monitoring blood glucose 4 times daily, before meals and before bed, and to call the clinic if she has readings less than 70 or greater than 300 for 3 tests in a row.  - She is not suitable candidate for SGLT2 inhibitors.  - Patient  specific target  A1c;  LDL, HDL, Triglycerides,  were discussed in detail.  2) BP/HTN :  Her blood pressure is controlled to target.   She is advised to continue her current blood pressure medications including Ramipril 5 mg p.o. daily with breakfast, and Lasix 20 mg po daily.    3) Lipids/HPL:  Her most recent lipid panel from 06/05/22 shows controlled but improved LDL of 96.  She is advised to continue Lipitor 40 mg po daily at bedtime.  Side effects and precautions discussed with her.  Will recheck lipid panel prior to next visit.  4) Vitamin D deficiency:  Her most recent vitamin D level was 23 on 06/05/22.  She does not need to start on any supplementation at this time.  Will recheck vitamin d prior to next visit.  5)  Weight/Diet:  Her Body mass index is 43.36 kg/m.  This clearly is complicating her diabetes care.  She is a candidate for modest weight loss.  She cannot exercise optimally.   Exercise, and detailed carbohydrates information provided.  6) Abnormal TSH Her recent thyroid labs are suggestive of hypothyroidism.  She is not currently on any thyroid supplements or antithyroid medications.  No need for intervention at this time.  Will monitor with repeat thyroid labs prior to next visit.  7) Chronic Care/Health Maintenance: -Patient is on ACEI/ARB and Statin medications and encouraged to continue to follow up with Ophthalmology, Podiatrist at least yearly or according to recommendations, and advised to   stay away from smoking. I have recommended yearly flu vaccine and pneumonia vaccination at least every 5 years;  60 minutes of walking 4 days a week is recommended for her , and  sleep for at least 7 hours a day.   - I advised patient to maintain close follow up with Carrolyn Meiers, MD for primary care needs.     I spent  31  minutes in the care of the patient today including review of labs from [place], Lipids, Thyroid Function, Hematology (current and previous including  abstractions from other facilities); face-to-face time discussing  her blood glucose readings/logs, discussing hypoglycemia and hyperglycemia episodes and symptoms, medications doses, her options  of short and long term treatment based on the latest standards of care / guidelines;  discussion about incorporating lifestyle medicine;  and documenting the encounter. Risk reduction counseling performed per USPSTF guidelines to reduce obesity and cardiovascular risk factors.     Please refer to Patient Instructions for Blood Glucose Monitoring and Insulin/Medications Dosing Guide"  in media tab for additional information. Please  also refer to " Patient Self Inventory" in the Media  tab for reviewed elements of pertinent patient history.  Ashley Blanchard participated in the discussions, expressed understanding, and voiced agreement with the above plans.  All questions were answered to her satisfaction. she is encouraged to contact clinic should she have any questions or concerns prior to her return visit.   Follow up plan: - Return in about 3 months (around 05/16/2023) for Diabetes F/U with A1c in office, Previsit labs, Bring meter and logs.  Rayetta Pigg, [facility] [address] [place], [place] [address] Phone: [phone] Fax: [phone]  02/14/2023, 11:04 AM

## 2023-03-28 ENCOUNTER — Other Ambulatory Visit: Payer: Self-pay | Admitting: Nurse Practitioner

## 2023-04-01 ENCOUNTER — Other Ambulatory Visit: Payer: Self-pay | Admitting: "Endocrinology

## 2023-05-07 ENCOUNTER — Other Ambulatory Visit (HOSPITAL_COMMUNITY): Payer: Self-pay | Admitting: Gerontology

## 2023-05-07 DIAGNOSIS — Z1231 Encounter for screening mammogram for malignant neoplasm of breast: Secondary | ICD-10-CM

## 2023-05-21 ENCOUNTER — Other Ambulatory Visit: Payer: Self-pay | Admitting: Nurse Practitioner

## 2023-05-22 LAB — COMPREHENSIVE METABOLIC PANEL
ALT: 30 IU/L (ref 0–32)
AST: 30 IU/L (ref 0–40)
Albumin: 3.4 g/dL — ABNORMAL LOW (ref 3.8–4.9)
Alkaline Phosphatase: 124 IU/L — ABNORMAL HIGH (ref 44–121)
BUN/Creatinine Ratio: 14 (ref 9–23)
BUN: 24 mg/dL (ref 6–24)
Bilirubin Total: 0.4 mg/dL (ref 0.0–1.2)
CO2: 25 mmol/L (ref 20–29)
Calcium: 9.8 mg/dL (ref 8.7–10.2)
Chloride: 99 mmol/L (ref 96–106)
Creatinine, Ser: 1.67 mg/dL — ABNORMAL HIGH (ref 0.57–1.00)
Globulin, Total: 2.6 g/dL (ref 1.5–4.5)
Glucose: 65 mg/dL — ABNORMAL LOW (ref 70–99)
Potassium: 4 mmol/L (ref 3.5–5.2)
Sodium: 139 mmol/L (ref 134–144)
Total Protein: 6 g/dL (ref 6.0–8.5)
eGFR: 35 mL/min/{1.73_m2} — ABNORMAL LOW (ref >59)

## 2023-05-22 LAB — VITAMIN D 25 HYDROXY (VIT D DEFICIENCY, FRACTURES): Vit D, 25-Hydroxy: 20.9 ng/mL — ABNORMAL LOW (ref 30.0–100.0)

## 2023-05-22 LAB — LIPID PANEL
Chol/HDL Ratio: 3.6 ratio (ref 0.0–4.4)
Cholesterol, Total: 160 mg/dL (ref 100–199)
HDL: 44 mg/dL (ref >39)
LDL Chol Calc (NIH): 95 mg/dL (ref 0–99)
Triglycerides: 115 mg/dL (ref 0–149)
VLDL Cholesterol Cal: 21 mg/dL (ref 5–40)

## 2023-05-22 LAB — TSH: TSH: 5.05 u[IU]/mL — ABNORMAL HIGH (ref 0.450–4.500)

## 2023-05-22 LAB — T4, FREE: Free T4: 0.95 ng/dL (ref 0.82–1.77)

## 2023-05-23 ENCOUNTER — Other Ambulatory Visit: Payer: Self-pay | Admitting: Nurse Practitioner

## 2023-05-29 ENCOUNTER — Encounter: Payer: Self-pay | Admitting: Nurse Practitioner

## 2023-05-29 ENCOUNTER — Ambulatory Visit (INDEPENDENT_AMBULATORY_CARE_PROVIDER_SITE_OTHER): Payer: Medicare Other | Admitting: Nurse Practitioner

## 2023-05-29 VITALS — BP 119/82 | HR 108 | Ht 60.0 in | Wt 219.4 lb

## 2023-05-29 DIAGNOSIS — N1831 Chronic kidney disease, stage 3a: Secondary | ICD-10-CM | POA: Diagnosis not present

## 2023-05-29 DIAGNOSIS — I10 Essential (primary) hypertension: Secondary | ICD-10-CM | POA: Diagnosis not present

## 2023-05-29 DIAGNOSIS — E782 Mixed hyperlipidemia: Secondary | ICD-10-CM | POA: Diagnosis not present

## 2023-05-29 DIAGNOSIS — E1122 Type 2 diabetes mellitus with diabetic chronic kidney disease: Secondary | ICD-10-CM

## 2023-05-29 DIAGNOSIS — E559 Vitamin D deficiency, unspecified: Secondary | ICD-10-CM

## 2023-05-29 DIAGNOSIS — Z794 Long term (current) use of insulin: Secondary | ICD-10-CM | POA: Diagnosis not present

## 2023-05-29 DIAGNOSIS — E039 Hypothyroidism, unspecified: Secondary | ICD-10-CM

## 2023-05-29 DIAGNOSIS — Z7984 Long term (current) use of oral hypoglycemic drugs: Secondary | ICD-10-CM

## 2023-05-29 DIAGNOSIS — R7989 Other specified abnormal findings of blood chemistry: Secondary | ICD-10-CM

## 2023-05-29 LAB — POCT GLYCOSYLATED HEMOGLOBIN (HGB A1C): Hemoglobin A1C: 8.1 % — AB (ref 4.0–5.6)

## 2023-05-29 MED ORDER — LEVOTHYROXINE SODIUM 25 MCG PO TABS: 25.0000 ug | ORAL_TABLET | Freq: Every day | ORAL | 0 refills | Status: DC

## 2023-05-29 MED ORDER — VITAMIN D (ERGOCALCIFEROL) 1.25 MG (50000 UNIT) PO CAPS: 50000.0000 [IU] | ORAL_CAPSULE | ORAL | 0 refills | Status: DC

## 2023-05-29 NOTE — Patient Instructions (Signed)

## 2023-05-29 NOTE — Progress Notes (Signed)
05/29/2023  Endocrinology follow-up note   Subjective:    Patient ID: Ashley Blanchard, female    DOB: Feb 02, 1966.   She is being seen in follow-up for history of uncontrolled type 2 diabetes, hyperlipidemia, hypertension. She is accompanied by nursing staff who is not immersed in her medical condition.  PMD:   Benetta Spar, MD  Past Medical History:  Diagnosis Date   Chronic anemia    Diabetes mellitus, type II (HCC)    Hearing loss    Hyperlipidemia    Hypertension    Mild mental retardation    Past Surgical History:  Procedure Laterality Date   CATARACT EXTRACTION     RETINAL DETACHMENT SURGERY     Social History   Socioeconomic History   Marital status: Single    Spouse name: Not on file   Number of children: Not on file   Years of education: Not on file   Highest education level: Not on file  Occupational History   Not on file  Tobacco Use   Smoking status: Never   Smokeless tobacco: Never  Vaping Use   Vaping status: Never Used  Substance and Sexual Activity   Alcohol use: No   Drug use: No   Sexual activity: Never    Birth control/protection: Post-menopausal  Other Topics Concern   Not on file  Social History Narrative   Not on file   Social Determinants of Health   Financial Resource Strain: Not on file  Food Insecurity: Not on file  Transportation Needs: Not on file  Physical Activity: Not on file  Stress: Not on file  Social Connections: Not on file   Outpatient Encounter Medications as of 05/29/2023  Medication Sig   ALLERGY RELIEF 10 MG tablet Take 10 mg by mouth daily.   aspirin EC 81 MG tablet Take 81 mg by mouth daily.   atorvastatin (LIPITOR) 40 MG tablet Take 1 tablet (40 mg total) by mouth daily.   benzonatate (TESSALON) 100 MG capsule Take by mouth.   Dextrose, Diabetic Use, (TRUEPLUS GLUCOSE PO) Take by mouth.   diltiazem (CARDIZEM CD) 180 MG 24 hr capsule Take 180 mg by mouth daily.   FEROSUL 325 (65 Fe) MG tablet Take by  mouth.   furosemide (LASIX) 20 MG tablet Take 20 mg by mouth.   GLIPIZIDE XL 5 MG 24 hr tablet TAKE 1 TABLET BY MOUTH DAILY AT BREAKFAST.   glucose blood (EASYMAX TEST) test strip USE TO CHECK BLOOD SUGAR 4 TIMES DAILY BEFORE BREAKFAST,LUNCH,SUPPER AND AT BEDTIME.(HOLD INSULIN IF BS<90:CALL MD IF BS>400)   Insulin Pen Needle (DROPSAFE SAFETY PEN NEEDLES) 31G X 6 MM MISC USE TO INJECT INSULIN THREE TIMES DAILY.   insulin regular human CONCENTRATED (HUMULIN R U-500 KWIKPEN) 500 UNIT/ML KwikPen INJECT 80 UNITS Sabina WITH BREAKFAST & LUNCH IF GLUCOSE IS ABOVE 90 & EATING. INJECT 30 UNITS Edison WITH SUPPER IF GLUCOSE IS ABOVE 90 & EATING.   Lancets 28G MISC USE TO CHECK BLOOD SUGAR 4 TIMES DAILY BEFORE BREAKFAST,LUNCH,SUPPER AND AT BEDTIME.(HOLD INSULIN IF BS<90:CALL MD IF BS>400)   levothyroxine (SYNTHROID) 25 MCG tablet Take 1 tablet (25 mcg total) by mouth daily.   metFORMIN (GLUCOPHAGE) 500 MG tablet Take by mouth 2 (two) times daily with a meal.   ramipril (ALTACE) 5 MG capsule Take 5 mg by mouth daily.   risperiDONE (RISPERDAL) 1 MG tablet Take 1 mg by mouth at bedtime.   sertraline (ZOLOFT) 100 MG tablet Take 100 mg by mouth daily.  TRADJENTA 5 MG TABS tablet Take 5 mg by mouth daily.   traZODone (DESYREL) 50 MG tablet Take 50 mg by mouth at bedtime.   TRUEPLUS GLUCOSE ON THE GO 4 g chewable tablet Chew by mouth.   Vitamin D, Ergocalciferol, (DRISDOL) 1.25 MG (50000 UNIT) CAPS capsule Take 1 capsule (50,000 Units total) by mouth every 7 (seven) days.   No facility-administered encounter medications on file as of 05/29/2023.   ALLERGIES: No Known Allergies VACCINATION STATUS:  There is no immunization history on file for this patient.  Diabetes She presents for her follow-up diabetic visit. She has type 2 diabetes mellitus. Onset time: She is not sure when she was diagnosed with diabetes. Her disease course has been improving. There are no hypoglycemic associated symptoms. Pertinent negatives  for hypoglycemia include no confusion, headaches, pallor or seizures. Pertinent negatives for diabetes include no chest pain, no fatigue, no polydipsia, no polyphagia and no polyuria. There are no hypoglycemic complications. Symptoms are stable. Diabetic complications include nephropathy. Risk factors for coronary artery disease include diabetes mellitus, hypertension, dyslipidemia, obesity and sedentary lifestyle. Current diabetic treatment includes intensive insulin program and oral agent (triple therapy). She is compliant with treatment most of the time (She is in a group home due to failure to care for self.). Her weight is fluctuating minimally. She is following a generally unhealthy diet. When asked about meal planning, she reported none. She has not had a previous visit with a dietitian. She rarely participates in exercise. Her home blood glucose trend is decreasing steadily. Her overall blood glucose range is 140-180 mg/dl. (She presents today, accompanied by care attendant from the nursing home, with her logs showing slowly improving glycemic profile overall.  Her POCT A1c today is 8.1%, unchanged from last visit.  She only had 1 episode of hypoglycemia since last visit.   ) An ACE inhibitor/angiotensin II receptor blocker is being taken. She sees a podiatrist.Eye exam is current.  Hypertension This is a chronic problem. The current episode started more than 1 year ago. The problem has been gradually improving since onset. The problem is controlled. Pertinent negatives include no chest pain, headaches, palpitations or shortness of breath. There are no associated agents to hypertension. Risk factors for coronary artery disease include dyslipidemia, diabetes mellitus, obesity and sedentary lifestyle. Past treatments include ACE inhibitors and diuretics. The current treatment provides moderate improvement. There are no compliance problems.  Hypertensive end-organ damage includes kidney disease. Identifiable  causes of hypertension include chronic renal disease.  Hyperlipidemia This is a chronic problem. The current episode started more than 1 year ago. The problem is uncontrolled. Recent lipid tests were reviewed and are high. Exacerbating diseases include chronic renal disease, diabetes and obesity. Pertinent negatives include no chest pain, myalgias or shortness of breath. Current antihyperlipidemic treatment includes statins. The current treatment provides mild improvement of lipids. Compliance problems include adherence to exercise.  Risk factors for coronary artery disease include diabetes mellitus, dyslipidemia, hypertension, obesity and a sedentary lifestyle.    Review of systems  Constitutional: + minimally fluctuating body weight,  current Body mass index is 42.85 kg/m. , no fatigue, no subjective hyperthermia, no subjective hypothermia Eyes: no blurry vision, no xerophthalmia ENT: no sore throat, no nodules palpated in throat, no dysphagia/odynophagia, no hoarseness Cardiovascular: no chest pain, no shortness of breath, no palpitations, no leg swelling Respiratory: no cough, no shortness of breath Gastrointestinal: no nausea/vomiting/diarrhea Musculoskeletal: no muscle/joint aches Skin: no rashes, no hyperemia Neurological: no tremors, no numbness, no  tingling, no dizziness Psychiatric: no depression, no anxiety, mild MR  Objective:    BP 119/82 (BP Location: Left Arm, Patient Position: Sitting, Cuff Size: Large)   Pulse (!) 108   Ht 5' (1.524 m)   Wt 219 lb 6.4 oz (99.5 kg)   LMP  (LMP Unknown)   BMI 42.85 kg/m   Wt Readings from Last 3 Encounters:  05/29/23 219 lb 6.4 oz (99.5 kg)  02/14/23 222 lb (100.7 kg)  10/15/22 217 lb 3.4 oz (98.5 kg)   BP Readings from Last 3 Encounters:  05/29/23 119/82  02/14/23 136/83  10/15/22 128/66     Physical Exam- Limited  Constitutional:  Body mass index is 42.85 kg/m. , not in acute distress, mild MR, not conversational- nods and  gestures appropriately Eyes:  EOMI, no exophthalmos Musculoskeletal: no gross deformities, strength intact in all four extremities, no gross restriction of joint movements Skin:  no rashes, no hyperemia Neurological: no tremor with outstretched hands   Diabetic Foot Exam - Simple   Simple Foot Form Diabetic Foot exam was performed with the following findings: Yes 05/29/2023 10:01 AM  Visual Inspection No deformities, no ulcerations, no other skin breakdown bilaterally: Yes Sensation Testing Intact to touch and monofilament testing bilaterally: Yes Pulse Check Posterior Tibialis and Dorsalis pulse intact bilaterally: Yes Comments      Lipid Panel     Component Value Date/Time   CHOL 160 05/21/2023 0807   TRIG 115 05/21/2023 0807   HDL 44 05/21/2023 0807   CHOLHDL 3.6 05/21/2023 0807   CHOLHDL 4.0 05/19/2020 0820   LDLCALC 95 05/21/2023 0807   LDLCALC 129 (H) 05/19/2020 0820   Recent Results (from the past 2160 hour(s))  Comprehensive metabolic panel     Status: Abnormal   Collection Time: 05/21/23  8:07 AM  Result Value Ref Range   Glucose 65 (L) 70 - 99 mg/dL   BUN 24 6 - 24 mg/dL   Creatinine, Ser 4.54 (H) 0.57 - 1.00 mg/dL   eGFR 35 (L) >09 WJ/XBJ/4.78   BUN/Creatinine Ratio 14 9 - 23   Sodium 139 134 - 144 mmol/L   Potassium 4.0 3.5 - 5.2 mmol/L   Chloride 99 96 - 106 mmol/L   CO2 25 20 - 29 mmol/L   Calcium 9.8 8.7 - 10.2 mg/dL   Total Protein 6.0 6.0 - 8.5 g/dL   Albumin 3.4 (L) 3.8 - 4.9 g/dL   Globulin, Total 2.6 1.5 - 4.5 g/dL   Bilirubin Total 0.4 0.0 - 1.2 mg/dL   Alkaline Phosphatase 124 (H) 44 - 121 IU/L   AST 30 0 - 40 IU/L   ALT 30 0 - 32 IU/L  Lipid panel     Status: None   Collection Time: 05/21/23  8:07 AM  Result Value Ref Range   Cholesterol, Total 160 100 - 199 mg/dL   Triglycerides 295 0 - 149 mg/dL   HDL 44 >62 mg/dL   VLDL Cholesterol Cal 21 5 - 40 mg/dL   LDL Chol Calc (NIH) 95 0 - 99 mg/dL   Chol/HDL Ratio 3.6 0.0 - 4.4 ratio     Comment:                                   T. Chol/HDL Ratio  Men  Women                               1/2 Avg.Risk  3.4    3.3                                   Avg.Risk  5.0    4.4                                2X Avg.Risk  9.6    7.1                                3X Avg.Risk 23.4   11.0   TSH     Status: Abnormal   Collection Time: 05/21/23  8:07 AM  Result Value Ref Range   TSH 5.050 (H) 0.450 - 4.500 uIU/mL  T4, free     Status: None   Collection Time: 05/21/23  8:07 AM  Result Value Ref Range   Free T4 0.95 0.82 - 1.77 ng/dL  VITAMIN D 25 Hydroxy (Vit-D Deficiency, Fractures)     Status: Abnormal   Collection Time: 05/21/23  8:07 AM  Result Value Ref Range   Vit D, 25-Hydroxy 20.9 (L) 30.0 - 100.0 ng/mL    Comment: Vitamin D deficiency has been defined by the Institute of Medicine and an Endocrine Society practice guideline as a level of serum 25-OH vitamin D less than 20 ng/mL (1,2). The Endocrine Society went on to further define vitamin D insufficiency as a level between 21 and 29 ng/mL (2). 1. IOM (Institute of Medicine). 2010. Dietary reference    intakes for calcium and D. [place]: The    [employer]. 2. Holick MF, Binkley [place], Bischoff-Ferrari HA, et al.    Evaluation, treatment, and prevention of vitamin D    deficiency: an Endocrine Society clinical practice    guideline. JCEM. 2011 Jul; 96(7):1911-30.   HgB A1c     Status: Abnormal   Collection Time: 05/29/23  9:57 AM  Result Value Ref Range   Hemoglobin A1C 8.1 (A) 4.0 - 5.6 %   HbA1c POC (<> result, manual entry)     HbA1c, POC (prediabetic range)     HbA1c, POC (controlled diabetic range)        Assessment & Plan:   1) Controlled type 2 diabetes mellitus with complication, without long-term current use of insulin (HCC)  She presents today, accompanied by care attendant from the nursing home, with her logs showing slowly improving glycemic  profile overall.  Her POCT A1c today is 8.1%, unchanged from last visit.  She only had 1 episode of hypoglycemia since last visit.   - She has complications from diabetes including CKD however patient remains at a high risk for more acute and chronic complications of diabetes which include CAD, CVA, CKD, retinopathy, and neuropathy.   -  She does not understand the high risk she has, these are all discussed with her care manager from the group home.  - Nutritional counseling repeated at each appointment due to patients tendency to fall back in to old habits.  - The patient admits there is a room for improvement in their diet and drink choices. -  Suggestion is  made for the patient to avoid simple carbohydrates from their diet including Cakes, Sweet Desserts / Pastries, Ice Cream, Soda (diet and regular), Sweet Tea, Candies, Chips, Cookies, Sweet Pastries, Store Bought Juices, Alcohol in Excess of 1-2 drinks a day, Artificial Sweeteners, Coffee Creamer, and "Sugar-free" Products. This will help patient to have stable blood glucose profile and potentially avoid unintended weight gain.   - I encouraged the patient to switch to unprocessed or minimally processed complex starch and increased protein intake (animal or plant source), fruits, and vegetables.   - Patient is advised to stick to a routine mealtimes to eat 3 meals a day and avoid unnecessary snacks (to snack only to correct hypoglycemia).  - I have approached patient with the following individualized plan to manage diabetes and patient agrees:   -Her unlimited access to sweetened beverages is making control of diabetes nearly impossible.  I did specifically write out recommendations to avoid sweetened beverages at last visit.  - Number 1 priority in her case is to avoid hypoglycemia.  -She will continue to need intensive treatment with higher dose of insulin in order for her to achieve and maintain control of diabetes to target.   -She is  advised to continue her Humulin R- U500 to 80 units with breakfast and lunch and continue her 30 units with supper, if glucose is above 90 and she is eating, continue Metformin 500 mg po twice daily with meals, Glipizide 5 mg XL daily with breakfast, and Tradjenta 5 mg po daily.  May look to stop the Metformin at next visit if kidney function continues to decline.  -She is encouraged to continue monitoring blood glucose 4 times daily, before meals and before bed, and to call the clinic if she has readings less than 70 or greater than 300 for 3 tests in a row.  - She is not suitable candidate for SGLT2 inhibitors.  - Patient specific target  A1c;  LDL, HDL, Triglycerides,  were discussed in detail.  2) BP/HTN :  Her blood pressure is controlled to target.   She is advised to continue her current blood pressure medications including Ramipril 5 mg p.o. daily with breakfast, and Lasix 20 mg po daily.    3) Lipids/HPL:  Her most recent lipid panel from 05/21/23 shows controlled LDL of 95.  She is advised to continue Lipitor 40 mg po daily at bedtime.  Side effects and precautions discussed with her.    4) Vitamin D deficiency:  Her most recent vitamin D level was 20.9 on 05/21/23.  I discussed and initiated Ergocalciferol 50000 units po weekly x 12 weeks.    5)  Weight/Diet:  Her Body mass index is 42.85 kg/m.  This clearly is complicating her diabetes care.  She is a candidate for modest weight loss.  She cannot exercise optimally.   Exercise, and detailed carbohydrates information provided.  6) Abnormal TSH/ Hypothyroidism Her recent thyroid labs are suggestive of hypothyroidism.  She is not currently on any thyroid supplements or antithyroid medications.    -Her repeat thyroid function tests are still suggestive of under-activity.  She could benefit from low dose Levothyroxine 25 mcg po daily before breakfast.   Will recheck TFTs prior to next visit and adjust dose accordingly.   - The correct  intake of thyroid hormone (Levothyroxine, Synthroid), is on empty stomach first thing in the morning, with water, separated by at least 30 minutes from breakfast and other medications,  and separated by more than 4 hours  from calcium, iron, multivitamins, acid reflux medications (PPIs).  - This medication is a life-long medication and will be needed to correct thyroid hormone imbalances for the rest of your life.  The dose may change from time to time, based on thyroid blood work.  - It is extremely important to be consistent taking this medication, near the same time each morning.  -AVOID TAKING PRODUCTS CONTAINING BIOTIN (commonly found in Hair, Skin, Nails vitamins) AS IT INTERFERES WITH THE VALIDITY OF THYROID FUNCTION BLOOD TESTS.  7) Chronic Care/Health Maintenance: -Patient is on ACEI/ARB and Statin medications and encouraged to continue to follow up with Ophthalmology, Podiatrist at least yearly or according to recommendations, and advised to   stay away from smoking. I have recommended yearly flu vaccine and pneumonia vaccination at least every 5 years;  60 minutes of walking 4 days a week is recommended for her , and  sleep for at least 7 hours a day.   - I advised patient to maintain close follow up with Benetta Spar, MD for primary care needs.     I spent  34  minutes in the care of the patient today including review of labs from CMP, Lipids, Thyroid Function, Hematology (current and previous including abstractions from other facilities); face-to-face time discussing  her blood glucose readings/logs, discussing hypoglycemia and hyperglycemia episodes and symptoms, medications doses, her options of short and long term treatment based on the latest standards of care / guidelines;  discussion about incorporating lifestyle medicine;  and documenting the encounter. Risk reduction counseling performed per USPSTF guidelines to reduce obesity and cardiovascular risk factors.      Please refer to Patient Instructions for Blood Glucose Monitoring and Insulin/Medications Dosing Guide"  in media tab for additional information. Please  also refer to " Patient Self Inventory" in the Media  tab for reviewed elements of pertinent patient history.  Lesleyann Crayton participated in the discussions, expressed understanding, and voiced agreement with the above plans.  All questions were answered to her satisfaction. she is encouraged to contact clinic should she have any questions or concerns prior to her return visit.   Follow up plan: - Return in about 3 months (around 08/29/2023) for Diabetes F/U with A1c in office, Thyroid follow up, Previsit labs, Bring meter and logs.  Ronny Bacon, [facility] [address] [place], [place] [address] Phone: [phone] Fax: [phone]  05/29/2023, 10:19 AM

## 2023-06-17 ENCOUNTER — Ambulatory Visit (HOSPITAL_COMMUNITY)
Admission: RE | Admit: 2023-06-17 | Discharge: 2023-06-17 | Disposition: A | Discharge status: Home / Self Care | Payer: Medicare Other | Source: Ambulatory Visit | Attending: Gerontology | Admitting: Gerontology

## 2023-06-17 DIAGNOSIS — Z1231 Encounter for screening mammogram for malignant neoplasm of breast: Secondary | ICD-10-CM | POA: Insufficient documentation

## 2023-07-02 ENCOUNTER — Telehealth: Payer: Self-pay | Admitting: *Deleted

## 2023-07-02 NOTE — Progress Notes (Signed)
  Care Coordination  Health Equity Plan Note   07/02/2023 Name: Ashley Blanchard MRN: [id number] DOB: 05-Aug-1966  Ashley Blanchard is a 57 y.o. year old female who sees Fanta, Wayland Salinas, MD for primary care. I reached out to [employer] by phone today to offer care coordination services.  [name] was given information about Care Coordination services today including:   The Care Coordination services include support from the care team which includes your Nurse Coordinator, Clinical Social Worker, or Pharmacist.  The Care Coordination team is here to help remove barriers to the health concerns and goals most important to you. Care Coordination services are voluntary, and the patient may decline or stop services at any time by request to their care team member.   Care Coordination Consent Status: Patient agreed to services and verbal consent obtained.   Follow up plan:  Telephone appointment with care coordination team member scheduled for:  07/16/23  Encounter Outcome:  Pt. Scheduled  [facility] Coordination Care Guide  Direct Dial: [phone]

## 2023-07-16 ENCOUNTER — Encounter: Payer: Self-pay | Admitting: *Deleted

## 2023-07-16 ENCOUNTER — Ambulatory Visit: Payer: Self-pay | Admitting: *Deleted

## 2023-07-16 NOTE — Patient Outreach (Signed)
  Care Coordination   Initial Visit Note   07/16/2023 Name: Ashley Blanchard MRN: [id number] DOB: 1965/12/17  Ashley Blanchard is a 57 y.o. year old female who sees Fanta, Wayland Salinas, MD for primary care. I  Ms Eakle was unavailable during my call and I was asked to speak with the supervisor. I spoke with Nettie Elm, supervisor at [facility] regarding Ms Such. Her diet, activities, and medications are managed by the facility. She is expected to reside their long term with no plan for discharge.   What matters to the patients health and wellness today?  I was unable to speak with the patient directly today    Goals Addressed             This Visit's Progress    COMPLETED: Care Coordination Services (No Follow-up Required)       Care Coordination Goals: Patient will continue work with staff at [facility] on a diabetic diet to manage blood sugar Patient will keep all medical appointments Patient will remain socially active        SDOH assessments and interventions completed:  Yes  SDOH Interventions Today    Flowsheet Row Most Recent Value  SDOH Interventions   Housing Interventions Intervention Not Indicated  Transportation Interventions Intervention Not Indicated        Care Coordination Interventions:  Yes, provided  Interventions Today    Flowsheet Row Most Recent Value  Chronic Disease   Chronic disease during today's visit Diabetes  General Interventions   General Interventions Discussed/Reviewed General Interventions Discussed, General Interventions Reviewed, Doctor Visits, Labs, Level of Care, Durable Medical Equipment (DME)  Labs Hgb A1c every 3 months  Doctor Visits Discussed/Reviewed Doctor Visits Discussed, Doctor Visits Reviewed, PCP, Annual Wellness Visits, Specialist  [Verified that PCP is Dr Felecia Shelling. Reviewed recent office visit with endocrinologist.]  Durable Medical Equipment (DME) Glucomoter  [Blood sugar readings  unavailable at time of call]  PCP/Specialist Visits Compliance with follow-up visit  Ronny Bacon, NP (endocrinology) 08/29/23. Patient is transported and accompanied by staff at [facility]]  Level of Care --  Charlyne Mom is a resident at [facility] where they provide her meals tailored to manage her medical conditions, provide her medications, and schedule her medical appointments as well as provide transportation]  Exercise Interventions   Exercise Discussed/Reviewed Exercise Discussed, Exercise Reviewed, Physical Activity  Physical Activity Discussed/Reviewed Physical Activity Discussed, Physical Activity Reviewed  [Patient goes to the senior center 5 days a week and participates in social activites as well as exercise programs]  Education Interventions   Education Provided Provided Education  Provided Verbal Education On Other, Labs  [employer] Services offered by [facility]]  Labs Reviewed Hgb A1c  [05/29/23 A1C 8.1%]  Nutrition Interventions   Nutrition Discussed/Reviewed Nutrition Discussed, Nutrition Reviewed  [Meals are provided by facility and diabetic diet is followed]  Pharmacy Interventions   Pharmacy Dicussed/Reviewed Pharmacy Topics Reviewed, Pharmacy Topics Discussed  [medications are managed by facility]       Follow up plan: No further intervention required. Patient is not eligible for services due to residing in a long term care facility.   Encounter Outcome:  Pt. Visit Completed   Demetrios Loll, RN, BSN RN Care Management Coordinator [facility]  [facility] Direct Dial: [phone] Main #: [phone]

## 2023-08-15 ENCOUNTER — Other Ambulatory Visit: Payer: Self-pay | Admitting: Nurse Practitioner

## 2023-08-23 LAB — COMPREHENSIVE METABOLIC PANEL
ALT: 16 [IU]/L (ref 0–32)
AST: 16 [IU]/L (ref 0–40)
Albumin: 3.5 g/dL — ABNORMAL LOW (ref 3.8–4.9)
Alkaline Phosphatase: 102 [IU]/L (ref 44–121)
BUN/Creatinine Ratio: 16 (ref 9–23)
BUN: 21 mg/dL (ref 6–24)
Bilirubin Total: 0.2 mg/dL (ref 0.0–1.2)
CO2: 24 mmol/L (ref 20–29)
Calcium: 10.2 mg/dL (ref 8.7–10.2)
Chloride: 98 mmol/L (ref 96–106)
Creatinine, Ser: 1.35 mg/dL — ABNORMAL HIGH (ref 0.57–1.00)
Globulin, Total: 2.8 g/dL (ref 1.5–4.5)
Glucose: 237 mg/dL — ABNORMAL HIGH (ref 70–99)
Potassium: 4.9 mmol/L (ref 3.5–5.2)
Sodium: 139 mmol/L (ref 134–144)
Total Protein: 6.3 g/dL (ref 6.0–8.5)
eGFR: 45 mL/min/{1.73_m2} — ABNORMAL LOW (ref >59)

## 2023-08-23 LAB — TSH: TSH: 2.9 u[IU]/mL (ref 0.450–4.500)

## 2023-08-23 LAB — T4, FREE: Free T4: 1.08 ng/dL (ref 0.82–1.77)

## 2023-08-29 ENCOUNTER — Encounter: Payer: Self-pay | Admitting: Nurse Practitioner

## 2023-08-29 ENCOUNTER — Telehealth: Payer: Self-pay

## 2023-08-29 ENCOUNTER — Ambulatory Visit (INDEPENDENT_AMBULATORY_CARE_PROVIDER_SITE_OTHER): Payer: Medicare Other | Admitting: Nurse Practitioner

## 2023-08-29 VITALS — BP 112/74 | HR 68 | Ht 60.0 in | Wt 224.0 lb

## 2023-08-29 DIAGNOSIS — E559 Vitamin D deficiency, unspecified: Secondary | ICD-10-CM

## 2023-08-29 DIAGNOSIS — N1831 Chronic kidney disease, stage 3a: Secondary | ICD-10-CM

## 2023-08-29 DIAGNOSIS — Z794 Long term (current) use of insulin: Secondary | ICD-10-CM | POA: Diagnosis not present

## 2023-08-29 DIAGNOSIS — E1122 Type 2 diabetes mellitus with diabetic chronic kidney disease: Secondary | ICD-10-CM | POA: Diagnosis not present

## 2023-08-29 DIAGNOSIS — I10 Essential (primary) hypertension: Secondary | ICD-10-CM

## 2023-08-29 DIAGNOSIS — E039 Hypothyroidism, unspecified: Secondary | ICD-10-CM

## 2023-08-29 DIAGNOSIS — E782 Mixed hyperlipidemia: Secondary | ICD-10-CM | POA: Diagnosis not present

## 2023-08-29 DIAGNOSIS — Z7984 Long term (current) use of oral hypoglycemic drugs: Secondary | ICD-10-CM | POA: Diagnosis not present

## 2023-08-29 LAB — POCT GLYCOSYLATED HEMOGLOBIN (HGB A1C): Hemoglobin A1C: 7.5 % — AB (ref 4.0–5.6)

## 2023-08-29 MED ORDER — VITAMIN D (ERGOCALCIFEROL) 1.25 MG (50000 UNIT) PO CAPS: 50000.0000 [IU] | ORAL_CAPSULE | ORAL | 3 refills | Status: DC

## 2023-08-29 NOTE — Telephone Encounter (Signed)
I sent in a refill for this for her.

## 2023-08-29 NOTE — Telephone Encounter (Signed)
Ashley Blanchard with [facility] stated pt had been taking Vitamin D 50,000 unit once a week since 7/17. States she needs an order whether to continue or to D/C. If pt is to continue Vitamin D she needs a refill sent to pharmacy.

## 2023-08-29 NOTE — Progress Notes (Signed)
08/29/2023  Endocrinology follow-up note   Subjective:    Patient ID: Ashley Blanchard, female    DOB: 12/26/1965.   She is being seen in follow-up for history of uncontrolled type 2 diabetes, hyperlipidemia, hypertension. She is accompanied by nursing staff who is not immersed in her medical condition.  PMD:   Benetta Spar, MD  Past Medical History:  Diagnosis Date   Chronic anemia    Diabetes mellitus, type II (HCC)    Hearing loss    Hyperlipidemia    Hypertension    Mild mental retardation    Past Surgical History:  Procedure Laterality Date   CATARACT EXTRACTION     RETINAL DETACHMENT SURGERY     Social History   Socioeconomic History   Marital status: Single    Spouse name: Not on file   Number of children: Not on file   Years of education: Not on file   Highest education level: Not on file  Occupational History   Not on file  Tobacco Use   Smoking status: Never   Smokeless tobacco: Never  Vaping Use   Vaping status: Never Used  Substance and Sexual Activity   Alcohol use: No   Drug use: No   Sexual activity: Never    Birth control/protection: Post-menopausal  Other Topics Concern   Not on file  Social History Narrative   Not on file   Social Determinants of Health   Financial Resource Strain: Not on file  Food Insecurity: Not on file  Transportation Needs: No Transportation Needs (07/16/2023)   PRAPARE - Transportation    Lack of Transportation (Medical): No    Lack of Transportation (Non-Medical): No  Physical Activity: Not on file  Stress: Not on file  Social Connections: Not on file   Outpatient Encounter Medications as of 08/29/2023  Medication Sig   ALLERGY RELIEF 10 MG tablet Take 10 mg by mouth daily.   aspirin EC 81 MG tablet Take 81 mg by mouth daily.   atorvastatin (LIPITOR) 40 MG tablet Take 1 tablet (40 mg total) by mouth daily.   benzonatate (TESSALON) 100 MG capsule Take by mouth.   Dextrose, Diabetic Use, (TRUEPLUS  GLUCOSE PO) Take by mouth.   diltiazem (CARDIZEM CD) 180 MG 24 hr capsule Take 180 mg by mouth daily.   FEROSUL 325 (65 Fe) MG tablet Take by mouth.   furosemide (LASIX) 20 MG tablet Take 20 mg by mouth.   GLIPIZIDE XL 5 MG 24 hr tablet TAKE 1 TABLET BY MOUTH DAILY AT BREAKFAST.   glucose blood (EASYMAX TEST) test strip USE TO CHECK BLOOD SUGAR 4 TIMES DAILY BEFORE BREAKFAST,LUNCH,SUPPER AND AT BEDTIME.(HOLD INSULIN IF BS<90:CALL MD IF BS>400)   Insulin Pen Needle (DROPSAFE SAFETY PEN NEEDLES) 31G X 6 MM MISC USE TO INJECT INSULIN THREE TIMES DAILY.   insulin regular human CONCENTRATED (HUMULIN R U-500 KWIKPEN) 500 UNIT/ML KwikPen INJECT 80 UNITS SQ WITH BREAKFAST & LUNCH IF GLUCOSE IS ABOVE 90 & EATING. INJECT 30 UNITS [place] WITH SUPPER IF GLUCOSE IS ABOVE 90 & EATING.   Lancets 28G MISC USE TO CHECK BLOOD SUGAR 4 TIMES DAILY BEFORE BREAKFAST,LUNCH,SUPPER AND AT BEDTIME.(HOLD INSULIN IF BS<90:CALL MD IF BS>400)   levothyroxine (SYNTHROID) 25 MCG tablet Take 1 tablet (25 mcg total) by mouth daily.   metFORMIN (GLUCOPHAGE) 500 MG tablet Take by mouth 2 (two) times daily with a meal.   ramipril (ALTACE) 5 MG capsule Take 5 mg by mouth daily.   risperiDONE (RISPERDAL) 1  MG tablet Take 1 mg by mouth at bedtime.   sertraline (ZOLOFT) 100 MG tablet Take 100 mg by mouth daily.   TRADJENTA 5 MG TABS tablet Take 5 mg by mouth daily.   traZODone (DESYREL) 50 MG tablet Take 50 mg by mouth at bedtime.   TRUEPLUS GLUCOSE ON THE GO 4 g chewable tablet Chew by mouth.   Vitamin D, Ergocalciferol, (DRISDOL) 1.25 MG (50000 UNIT) CAPS capsule Take 1 capsule (50,000 Units total) by mouth every 7 (seven) days.   No facility-administered encounter medications on file as of 08/29/2023.   ALLERGIES: No Known Allergies VACCINATION STATUS:  There is no immunization history on file for this patient.  Diabetes She presents for her follow-up diabetic visit. She has type 2 diabetes mellitus. Onset time: She is not sure  when she was diagnosed with diabetes. Her disease course has been improving. There are no hypoglycemic associated symptoms. Pertinent negatives for hypoglycemia include no confusion, headaches, pallor or seizures. Pertinent negatives for diabetes include no chest pain, no fatigue, no polydipsia, no polyphagia and no polyuria. There are no hypoglycemic complications. Symptoms are stable. Diabetic complications include nephropathy. Risk factors for coronary artery disease include diabetes mellitus, hypertension, dyslipidemia, obesity and sedentary lifestyle. Current diabetic treatment includes intensive insulin program and oral agent (triple therapy). She is compliant with treatment most of the time (She is in a group home due to failure to care for self.). Her weight is fluctuating minimally. She is following a generally unhealthy diet. When asked about meal planning, she reported none. She has not had a previous visit with a dietitian. She rarely participates in exercise. Her home blood glucose trend is decreasing steadily. Her overall blood glucose range is 140-180 mg/dl. (She presents today, accompanied by care attendant from the nursing home, with her logs showing slowly improving glycemic profile overall.  Her POCT A1c today is 7.5%, improving from last visit of 8.1%.  She has not had any significant hypoglycemia documented.) An ACE inhibitor/angiotensin II receptor blocker is being taken. She sees a podiatrist.Eye exam is current.  Hypertension This is a chronic problem. The current episode started more than 1 year ago. The problem has been gradually improving since onset. The problem is controlled. Pertinent negatives include no chest pain, headaches, palpitations or shortness of breath. There are no associated agents to hypertension. Risk factors for coronary artery disease include dyslipidemia, diabetes mellitus, obesity and sedentary lifestyle. Past treatments include ACE inhibitors and diuretics. The  current treatment provides moderate improvement. There are no compliance problems.  Hypertensive end-organ damage includes kidney disease. Identifiable causes of hypertension include chronic renal disease.  Hyperlipidemia This is a chronic problem. The current episode started more than 1 year ago. The problem is uncontrolled. Recent lipid tests were reviewed and are high. Exacerbating diseases include chronic renal disease, diabetes and obesity. Pertinent negatives include no chest pain, myalgias or shortness of breath. Current antihyperlipidemic treatment includes statins. The current treatment provides mild improvement of lipids. Compliance problems include adherence to exercise.  Risk factors for coronary artery disease include diabetes mellitus, dyslipidemia, hypertension, obesity and a sedentary lifestyle.    Review of systems  Constitutional: + minimally fluctuating body weight,  current Body mass index is 43.75 kg/m. , no fatigue, no subjective hyperthermia, no subjective hypothermia Eyes: no blurry vision, no xerophthalmia ENT: no sore throat, no nodules palpated in throat, no dysphagia/odynophagia, no hoarseness Cardiovascular: no chest pain, no shortness of breath, no palpitations, no leg swelling Respiratory: no cough, no  shortness of breath Gastrointestinal: no nausea/vomiting/diarrhea Musculoskeletal: no muscle/joint aches Skin: no rashes, no hyperemia Neurological: no tremors, no numbness, no tingling, no dizziness Psychiatric: no depression, no anxiety, mild MR  Objective:    BP 112/74 (BP Location: Right Arm, Patient Position: Sitting, Cuff Size: Large)   Pulse 68   Ht 5' (1.524 m)   Wt 224 lb (101.6 kg)   LMP  (LMP Unknown)   BMI 43.75 kg/m   Wt Readings from Last 3 Encounters:  08/29/23 224 lb (101.6 kg)  05/29/23 219 lb 6.4 oz (99.5 kg)  02/14/23 222 lb (100.7 kg)   BP Readings from Last 3 Encounters:  08/29/23 112/74  05/29/23 119/82  02/14/23 136/83      Physical Exam- Limited  Constitutional:  Body mass index is 43.75 kg/m. , not in acute distress, mild MR, not conversational- nods and gestures appropriately Eyes:  EOMI, no exophthalmos Musculoskeletal: no gross deformities, strength intact in all four extremities, no gross restriction of joint movements Skin:  no rashes, no hyperemia Neurological: no tremor with outstretched hands   Diabetic Foot Exam - Simple   No data filed      Lipid Panel     Component Value Date/Time   CHOL 160 05/21/2023 0807   TRIG 115 05/21/2023 0807   HDL 44 05/21/2023 0807   CHOLHDL 3.6 05/21/2023 0807   CHOLHDL 4.0 05/19/2020 0820   LDLCALC 95 05/21/2023 0807   LDLCALC 129 (H) 05/19/2020 0820   Recent Results (from the past 2160 hour(s))  TSH     Status: None   Collection Time: 08/22/23  8:21 AM  Result Value Ref Range   TSH 2.900 0.450 - 4.500 uIU/mL  T4, free     Status: None   Collection Time: 08/22/23  8:21 AM  Result Value Ref Range   Free T4 1.08 0.82 - 1.77 ng/dL  Comprehensive metabolic panel     Status: Abnormal   Collection Time: 08/22/23  8:21 AM  Result Value Ref Range   Glucose 237 (H) 70 - 99 mg/dL   BUN 21 6 - 24 mg/dL   Creatinine, Ser 8.11 (H) 0.57 - 1.00 mg/dL   eGFR 45 (L) >91 YN/WGN/5.62   BUN/Creatinine Ratio 16 9 - 23   Sodium 139 134 - 144 mmol/L   Potassium 4.9 3.5 - 5.2 mmol/L   Chloride 98 96 - 106 mmol/L   CO2 24 20 - 29 mmol/L   Calcium 10.2 8.7 - 10.2 mg/dL   Total Protein 6.3 6.0 - 8.5 g/dL   Albumin 3.5 (L) 3.8 - 4.9 g/dL   Globulin, Total 2.8 1.5 - 4.5 g/dL   Bilirubin Total 0.2 0.0 - 1.2 mg/dL   Alkaline Phosphatase 102 44 - 121 IU/L   AST 16 0 - 40 IU/L   ALT 16 0 - 32 IU/L  HgB A1c     Status: Abnormal   Collection Time: 08/29/23 10:45 AM  Result Value Ref Range   Hemoglobin A1C 7.5 (A) 4.0 - 5.6 %   HbA1c POC (<> result, manual entry)     HbA1c, POC (prediabetic range)     HbA1c, POC (controlled diabetic range)        Assessment &  Plan:   1) Controlled type 2 diabetes mellitus with complication, without long-term current use of insulin (HCC)  She presents today, accompanied by care attendant from the nursing home, with her logs showing slowly improving glycemic profile overall.  Her POCT A1c today is 7.5%,  improving from last visit of 8.1%.  She has not had any significant hypoglycemia documented.  - She has complications from diabetes including CKD however patient remains at a high risk for more acute and chronic complications of diabetes which include CAD, CVA, CKD, retinopathy, and neuropathy.   -  She does not understand the high risk she has, these are all discussed with her care manager from the group home.  - Nutritional counseling repeated at each appointment due to patients tendency to fall back in to old habits.  - The patient admits there is a room for improvement in their diet and drink choices. -  Suggestion is made for the patient to avoid simple carbohydrates from their diet including Cakes, Sweet Desserts / Pastries, Ice Cream, Soda (diet and regular), Sweet Tea, Candies, Chips, Cookies, Sweet Pastries, Store Bought Juices, Alcohol in Excess of 1-2 drinks a day, Artificial Sweeteners, Coffee Creamer, and "Sugar-free" Products. This will help patient to have stable blood glucose profile and potentially avoid unintended weight gain.   - I encouraged the patient to switch to unprocessed or minimally processed complex starch and increased protein intake (animal or plant source), fruits, and vegetables.   - Patient is advised to stick to a routine mealtimes to eat 3 meals a day and avoid unnecessary snacks (to snack only to correct hypoglycemia).  - I have approached patient with the following individualized plan to manage diabetes and patient agrees:   -Her unlimited access to sweetened beverages is making control of diabetes nearly impossible.  I did specifically write out recommendations to avoid sweetened  beverages at last visit.  - Number 1 priority in her case is to avoid hypoglycemia.  -She will continue to need intensive treatment with higher dose of insulin in order for her to achieve and maintain control of diabetes to target.   -Given her stable, improved glycemic profile overall, no changes will be made to her medication regimen today.  She is advised to continue her Humulin R- U500 to 80 units with breakfast and lunch and continue her 30 units with supper, if glucose is above 90 and she is eating, continue Metformin 500 mg po twice daily with meals, Glipizide 5 mg XL daily with breakfast, and Tradjenta 5 mg po daily.  Kidney function was stable (slightly improved) so we do not need to stop the Metformin.  -She is encouraged to continue monitoring blood glucose 4 times daily, before meals and before bed, and to call the clinic if she has readings less than 70 or greater than 300 for 3 tests in a row.  - She is not suitable candidate for SGLT2 inhibitors.  - Patient specific target  A1c;  LDL, HDL, Triglycerides,  were discussed in detail.  2) BP/HTN :  Her blood pressure is controlled to target.   She is advised to continue her current blood pressure medications including Ramipril 5 mg p.o. daily with breakfast, and Lasix 20 mg po daily.    3) Lipids/HPL:  Her most recent lipid panel from 05/21/23 shows controlled LDL of 95.  She is advised to continue Lipitor 40 mg po daily at bedtime.  Side effects and precautions discussed with her.    4) Vitamin D deficiency:  Her most recent vitamin D level was 20.9 on 05/21/23.  I discussed and initiated Ergocalciferol 50000 units po weekly x 12 weeks.    5)  Weight/Diet:  Her Body mass index is 43.75 kg/m.  This clearly is complicating her diabetes  care.  She is a candidate for modest weight loss.  She cannot exercise optimally.   Exercise, and detailed carbohydrates information provided.  6) Abnormal TSH/ Hypothyroidism Her recent thyroid labs  are suggestive of hypothyroidism.  She is not currently on any thyroid supplements or antithyroid medications.    -Her previsit TFTs are consistent with appropriate hormone replacement.  She is advised to continue Levothyroxine 25 mcg po daily before breakfast. Will recheck TFTs on subsequent visits and adjust dose accordingly.   - The correct intake of thyroid hormone (Levothyroxine, Synthroid), is on empty stomach first thing in the morning, with water, separated by at least 30 minutes from breakfast and other medications,  and separated by more than 4 hours from calcium, iron, multivitamins, acid reflux medications (PPIs).  - This medication is a life-long medication and will be needed to correct thyroid hormone imbalances for the rest of your life.  The dose may change from time to time, based on thyroid blood work.  - It is extremely important to be consistent taking this medication, near the same time each morning.  -AVOID TAKING PRODUCTS CONTAINING BIOTIN (commonly found in Hair, Skin, Nails vitamins) AS IT INTERFERES WITH THE VALIDITY OF THYROID FUNCTION BLOOD TESTS.  7) Chronic Care/Health Maintenance: -Patient is on ACEI/ARB and Statin medications and encouraged to continue to follow up with Ophthalmology, Podiatrist at least yearly or according to recommendations, and advised to   stay away from smoking. I have recommended yearly flu vaccine and pneumonia vaccination at least every 5 years;  60 minutes of walking 4 days a week is recommended for her , and  sleep for at least 7 hours a day.   - I advised patient to maintain close follow up with Benetta Spar, MD for primary care needs.     I spent  41  minutes in the care of the patient today including review of labs from CMP, Lipids, Thyroid Function, Hematology (current and previous including abstractions from other facilities); face-to-face time discussing  her blood glucose readings/logs, discussing hypoglycemia and  hyperglycemia episodes and symptoms, medications doses, her options of short and long term treatment based on the latest standards of care / guidelines;  discussion about incorporating lifestyle medicine;  and documenting the encounter. Risk reduction counseling performed per USPSTF guidelines to reduce obesity and cardiovascular risk factors.     Please refer to Patient Instructions for Blood Glucose Monitoring and Insulin/Medications Dosing Guide"  in media tab for additional information. Please  also refer to " Patient Self Inventory" in the Media  tab for reviewed elements of pertinent patient history.  Juel Sampath participated in the discussions, expressed understanding, and voiced agreement with the above plans.  All questions were answered to her satisfaction. she is encouraged to contact clinic should she have any questions or concerns prior to her return visit.   Follow up plan: - Return in about 4 months (around 12/30/2023) for Diabetes F/U- A1c and UM in office, No previsit labs, Bring meter and logs.  Ronny Bacon, [facility] [address] [place], [place] [address] Phone: [phone] Fax: [phone]  08/29/2023, 10:54 AM

## 2023-08-29 NOTE — Patient Instructions (Signed)

## 2023-09-02 ENCOUNTER — Other Ambulatory Visit: Payer: Self-pay | Admitting: Nurse Practitioner

## 2023-09-25 ENCOUNTER — Other Ambulatory Visit: Payer: Self-pay | Admitting: Nurse Practitioner

## 2023-09-27 ENCOUNTER — Other Ambulatory Visit: Payer: Self-pay | Admitting: Nurse Practitioner

## 2023-10-31 ENCOUNTER — Other Ambulatory Visit: Payer: Self-pay | Admitting: Nurse Practitioner

## 2023-10-31 ENCOUNTER — Other Ambulatory Visit: Payer: Self-pay

## 2023-10-31 MED ORDER — LANCETS 28G MISC: 1 refills | Status: DC

## 2023-12-02 ENCOUNTER — Other Ambulatory Visit: Payer: Self-pay | Admitting: Nurse Practitioner

## 2023-12-05 ENCOUNTER — Other Ambulatory Visit: Payer: Self-pay | Admitting: Nurse Practitioner

## 2023-12-30 ENCOUNTER — Other Ambulatory Visit: Payer: Self-pay | Admitting: Nurse Practitioner

## 2023-12-30 ENCOUNTER — Ambulatory Visit (INDEPENDENT_AMBULATORY_CARE_PROVIDER_SITE_OTHER): Payer: Medicare Other | Admitting: Nurse Practitioner

## 2023-12-30 ENCOUNTER — Encounter: Payer: Self-pay | Admitting: Nurse Practitioner

## 2023-12-30 VITALS — BP 112/68 | HR 100 | Ht 60.0 in | Wt 224.8 lb

## 2023-12-30 DIAGNOSIS — Z794 Long term (current) use of insulin: Secondary | ICD-10-CM | POA: Diagnosis not present

## 2023-12-30 DIAGNOSIS — I10 Essential (primary) hypertension: Secondary | ICD-10-CM | POA: Diagnosis not present

## 2023-12-30 DIAGNOSIS — E1122 Type 2 diabetes mellitus with diabetic chronic kidney disease: Secondary | ICD-10-CM

## 2023-12-30 DIAGNOSIS — Z7984 Long term (current) use of oral hypoglycemic drugs: Secondary | ICD-10-CM

## 2023-12-30 DIAGNOSIS — N1831 Chronic kidney disease, stage 3a: Secondary | ICD-10-CM

## 2023-12-30 DIAGNOSIS — E782 Mixed hyperlipidemia: Secondary | ICD-10-CM | POA: Diagnosis not present

## 2023-12-30 DIAGNOSIS — E559 Vitamin D deficiency, unspecified: Secondary | ICD-10-CM

## 2023-12-30 DIAGNOSIS — E039 Hypothyroidism, unspecified: Secondary | ICD-10-CM | POA: Diagnosis not present

## 2023-12-30 LAB — POCT GLYCOSYLATED HEMOGLOBIN (HGB A1C): Hemoglobin A1C: 7.3 % — AB (ref 4.0–5.6)

## 2023-12-30 NOTE — Progress Notes (Signed)
12/30/2023  Endocrinology follow-up note   Subjective:    Patient ID: Ashley Blanchard, female    DOB: 08/29/1966.   She is being seen in follow-up for history of uncontrolled type 2 diabetes, hyperlipidemia, hypertension. She is accompanied by nursing staff who is not immersed in her medical condition.  PMD:   Benetta Spar, MD  Past Medical History:  Diagnosis Date   Chronic anemia    Diabetes mellitus, type II (HCC)    Hearing loss    Hyperlipidemia    Hypertension    Mild mental retardation    Past Surgical History:  Procedure Laterality Date   CATARACT EXTRACTION     RETINAL DETACHMENT SURGERY     Social History   Socioeconomic History   Marital status: Single    Spouse name: Not on file   Number of children: Not on file   Years of education: Not on file   Highest education level: Not on file  Occupational History   Not on file  Tobacco Use   Smoking status: Never   Smokeless tobacco: Never  Vaping Use   Vaping status: Never Used  Substance and Sexual Activity   Alcohol use: No   Drug use: No   Sexual activity: Never    Birth control/protection: Post-menopausal  Other Topics Concern   Not on file  Social History Narrative   Not on file   Social Drivers of Health   Financial Resource Strain: Not on file  Food Insecurity: Not on file  Transportation Needs: No Transportation Needs (07/16/2023)   PRAPARE - Transportation    Lack of Transportation (Medical): No    Lack of Transportation (Non-Medical): No  Physical Activity: Not on file  Stress: Not on file  Social Connections: Not on file   Outpatient Encounter Medications as of 12/30/2023  Medication Sig   ALLERGY RELIEF 10 MG tablet Take 10 mg by mouth daily.   aspirin EC 81 MG tablet Take 81 mg by mouth daily.   atorvastatin (LIPITOR) 40 MG tablet Take 1 tablet (40 mg total) by mouth daily.   benzonatate (TESSALON) 100 MG capsule Take by mouth.   Dextrose, Diabetic Use, (TRUEPLUS GLUCOSE PO)  Take by mouth.   diltiazem (CARDIZEM CD) 180 MG 24 hr capsule Take 180 mg by mouth daily.   FEROSUL 325 (65 Fe) MG tablet Take by mouth.   furosemide (LASIX) 20 MG tablet Take 20 mg by mouth.   GLIPIZIDE XL 5 MG 24 hr tablet TAKE 1 TABLET BY MOUTH DAILY AT BREAKFAST.   glucose blood (EASYMAX TEST) test strip USE TO CHECK BLOOD SUGAR 4 TIMES DAILY BEFORE BREAKFAST,LUNCH,SUPPER AND AT BEDTIME.(HOLD INSULIN IF BS<90:CALL MD IF BS>400)   HUMULIN R U-500 KWIKPEN 500 UNIT/ML KwikPen INJECT 80 UNITS SQ WITH BREAKFAST & LUNCH IF GLUCOSE IS ABOVE 90 & EATING. INJECT 30 UNITS [place] WITH SUPPER IF GLUCOSE IS ABOVE 90 & EATING.   Insulin Pen Needle (DROPSAFE SAFETY PEN NEEDLES) 31G X 6 MM MISC USE TO INJECT INSULIN THREE TIMES DAILY.   Lancets 28G MISC UseUSE TO CHECK BLOOD SUGAR 4 TIMES DAILY BEFORE BREAKFAST,LUNCH,SUPPER AND AT BEDTIME.(HOLD INSULIN IF BS<90:CALL MD IF BS>400)   levothyroxine (SYNTHROID) 25 MCG tablet Take 1 tablet (25 mcg total) by mouth daily.   metFORMIN (GLUCOPHAGE) 500 MG tablet Take by mouth 2 (two) times daily with a meal.   ramipril (ALTACE) 5 MG capsule Take 5 mg by mouth daily.   risperiDONE (RISPERDAL) 1 MG tablet Take 1  mg by mouth at bedtime.   sertraline (ZOLOFT) 100 MG tablet Take 100 mg by mouth daily.   TRADJENTA 5 MG TABS tablet Take 5 mg by mouth daily.   traZODone (DESYREL) 50 MG tablet Take 50 mg by mouth at bedtime.   TRUEPLUS GLUCOSE ON THE GO 4 g chewable tablet Chew by mouth.   Vitamin D, Ergocalciferol, (DRISDOL) 1.25 MG (50000 UNIT) CAPS capsule Take 1 capsule (50,000 Units total) by mouth every 7 (seven) days.   No facility-administered encounter medications on file as of 12/30/2023.   ALLERGIES: No Known Allergies VACCINATION STATUS:  There is no immunization history on file for this patient.  Diabetes She presents for her follow-up diabetic visit. She has type 2 diabetes mellitus. Onset time: She is not sure when she was diagnosed with diabetes. Her  disease course has been improving. There are no hypoglycemic associated symptoms. Pertinent negatives for hypoglycemia include no confusion, headaches, pallor or seizures. Pertinent negatives for diabetes include no chest pain, no fatigue, no polydipsia, no polyphagia and no polyuria. There are no hypoglycemic complications. Symptoms are stable. Diabetic complications include nephropathy. Risk factors for coronary artery disease include diabetes mellitus, hypertension, dyslipidemia, obesity and sedentary lifestyle. Current diabetic treatment includes intensive insulin program and oral agent (triple therapy). She is compliant with treatment most of the time (She is in a group home due to failure to care for self.). Her weight is fluctuating minimally. She is following a generally unhealthy diet. When asked about meal planning, she reported none. She has not had a previous visit with a dietitian. She rarely participates in exercise. Her home blood glucose trend is decreasing steadily. Her overall blood glucose range is 140-180 mg/dl. (She presents today, accompanied by care attendant from the nursing home, with her logs showing slowly improving glycemic profile overall.  Her POCT A1c today is 7.3%, improving from last visit of 7.5%.  She has not had any significant hypoglycemia documented, did have one episode of mild fasting hypoglycemia at 60.) An ACE inhibitor/angiotensin II receptor blocker is being taken. She sees a podiatrist.Eye exam is current.  Hypertension This is a chronic problem. The current episode started more than 1 year ago. The problem has been gradually improving since onset. The problem is controlled. Pertinent negatives include no chest pain, headaches, palpitations or shortness of breath. There are no associated agents to hypertension. Risk factors for coronary artery disease include dyslipidemia, diabetes mellitus, obesity and sedentary lifestyle. Past treatments include ACE inhibitors and  diuretics. The current treatment provides moderate improvement. There are no compliance problems.  Hypertensive end-organ damage includes kidney disease. Identifiable causes of hypertension include chronic renal disease.  Hyperlipidemia This is a chronic problem. The current episode started more than 1 year ago. The problem is uncontrolled. Recent lipid tests were reviewed and are high. Exacerbating diseases include chronic renal disease, diabetes and obesity. Pertinent negatives include no chest pain, myalgias or shortness of breath. Current antihyperlipidemic treatment includes statins. The current treatment provides mild improvement of lipids. Compliance problems include adherence to exercise.  Risk factors for coronary artery disease include diabetes mellitus, dyslipidemia, hypertension, obesity and a sedentary lifestyle.    Review of systems  Constitutional: + minimally fluctuating body weight,  current Body mass index is 43.9 kg/m. , no fatigue, no subjective hyperthermia, no subjective hypothermia Eyes: no blurry vision, no xerophthalmia ENT: no sore throat, no nodules palpated in throat, no dysphagia/odynophagia, no hoarseness Cardiovascular: no chest pain, no shortness of breath, no palpitations, no  leg swelling Respiratory: no cough, no shortness of breath Gastrointestinal: no nausea/vomiting/diarrhea Musculoskeletal: no muscle/joint aches Skin: no rashes, no hyperemia Neurological: no tremors, no numbness, no tingling, no dizziness Psychiatric: no depression, no anxiety, mild MR  Objective:    BP 112/68 (BP Location: Left Arm, Patient Position: Sitting, Cuff Size: Large)   Pulse 100   Ht 5' (1.524 m)   Wt 224 lb 12.8 oz (102 kg)   LMP  (LMP Unknown)   BMI 43.90 kg/m   Wt Readings from Last 3 Encounters:  12/30/23 224 lb 12.8 oz (102 kg)  08/29/23 224 lb (101.6 kg)  05/29/23 219 lb 6.4 oz (99.5 kg)   BP Readings from Last 3 Encounters:  12/30/23 112/68  08/29/23 112/74   05/29/23 119/82     Physical Exam- Limited  Constitutional:  Body mass index is 43.9 kg/m. , not in acute distress, mild MR, not conversational- nods and gestures appropriately Eyes:  EOMI, no exophthalmos Musculoskeletal: no gross deformities, strength intact in all four extremities, no gross restriction of joint movements Skin:  no rashes, no hyperemia Neurological: no tremor with outstretched hands   Diabetic Foot Exam - Simple   No data filed      Lipid Panel     Component Value Date/Time   CHOL 160 05/21/2023 0807   TRIG 115 05/21/2023 0807   HDL 44 05/21/2023 0807   CHOLHDL 3.6 05/21/2023 0807   CHOLHDL 4.0 05/19/2020 0820   LDLCALC 95 05/21/2023 0807   LDLCALC 129 (H) 05/19/2020 0820   Recent Results (from the past 2160 hours)  HgB A1c     Status: Abnormal   Collection Time: 12/30/23  9:42 AM  Result Value Ref Range   Hemoglobin A1C 7.3 (A) 4.0 - 5.6 %   HbA1c POC (<> result, manual entry)     HbA1c, POC (prediabetic range)     HbA1c, POC (controlled diabetic range)         Assessment & Plan:   1) Controlled type 2 diabetes mellitus with complication, without long-term current use of insulin (HCC)  She presents today, accompanied by care attendant from the nursing home, with her logs showing slowly improving glycemic profile overall.  Her POCT A1c today is 7.3%, improving from last visit of 7.5%.  She has not had any significant hypoglycemia documented, did have one episode of mild fasting hypoglycemia at 60.  - She has complications from diabetes including CKD however patient remains at a high risk for more acute and chronic complications of diabetes which include CAD, CVA, CKD, retinopathy, and neuropathy.   -  She does not understand the high risk she has, these are all discussed with her care manager from the group home.  - Nutritional counseling repeated at each appointment due to patients tendency to fall back in to old habits.  - The patient  admits there is a room for improvement in their diet and drink choices. -  Suggestion is made for the patient to avoid simple carbohydrates from their diet including Cakes, Sweet Desserts / Pastries, Ice Cream, Soda (diet and regular), Sweet Tea, Candies, Chips, Cookies, Sweet Pastries, Store Bought Juices, Alcohol in Excess of 1-2 drinks a day, Artificial Sweeteners, Coffee Creamer, and "Sugar-free" Products. This will help patient to have stable blood glucose profile and potentially avoid unintended weight gain.   - I encouraged the patient to switch to unprocessed or minimally processed complex starch and increased protein intake (animal or plant source), fruits, and vegetables.   -  Patient is advised to stick to a routine mealtimes to eat 3 meals a day and avoid unnecessary snacks (to snack only to correct hypoglycemia).  - I have approached patient with the following individualized plan to manage diabetes and patient agrees:   - Number 1 priority in her case is to avoid hypoglycemia.  -She will continue to need intensive treatment with higher dose of insulin in order for her to achieve and maintain control of diabetes to target.   -Given her stable, improved glycemic profile overall, no changes will be made to her medication regimen today.  She is advised to continue her Humulin R- U500 to 80 units with breakfast and lunch and continue her 30 units with supper, if glucose is above 90 and she is eating, continue Metformin 500 mg po twice daily with meals, Glipizide 5 mg XL daily with breakfast, and Tradjenta 5 mg po daily.  Will recheck kidney function prior to next visit.  -She is encouraged to continue monitoring blood glucose 4 times daily, before meals and before bed, and to call the clinic if she has readings less than 70 or greater than 300 for 3 tests in a row.  - She is not suitable candidate for SGLT2 inhibitors.  - Patient specific target  A1c;  LDL, HDL, Triglycerides,  were  discussed in detail.  2) BP/HTN :  Her blood pressure is controlled to target.   She is advised to continue her current blood pressure medications including Ramipril 5 mg p.o. daily with breakfast, and Lasix 20 mg po daily.    3) Lipids/HPL:  Her most recent lipid panel from 05/21/23 shows controlled LDL of 95.  She is advised to continue Lipitor 40 mg po daily at bedtime.  Side effects and precautions discussed with her.  Will recheck lipid panel prior to next visit.  4) Vitamin D deficiency:  Her most recent vitamin D level was 20.9 on 05/21/23.  I discussed and initiated Ergocalciferol 50000 units po weekly x 12 weeks.  Will recheck vitamin d prior to next visit.  5)  Weight/Diet:  Her Body mass index is 43.9 kg/m.  This clearly is complicating her diabetes care.  She is a candidate for modest weight loss.  She cannot exercise optimally.   Exercise, and detailed carbohydrates information provided.  6) Abnormal TSH/ Hypothyroidism Her recent thyroid labs are suggestive of hypothyroidism.  She is not currently on any thyroid supplements or antithyroid medications.    -There are no recent TFTs to review.  She is advised to continue Levothyroxine 25 mcg po daily before breakfast. Will recheck TFTs prior to next visit and adjust dose accordingly.   - The correct intake of thyroid hormone (Levothyroxine, Synthroid), is on empty stomach first thing in the morning, with water, separated by at least 30 minutes from breakfast and other medications,  and separated by more than 4 hours from calcium, iron, multivitamins, acid reflux medications (PPIs).  - This medication is a life-long medication and will be needed to correct thyroid hormone imbalances for the rest of your life.  The dose may change from time to time, based on thyroid blood work.  - It is extremely important to be consistent taking this medication, near the same time each morning.  -AVOID TAKING PRODUCTS CONTAINING BIOTIN (commonly found  in Hair, Skin, Nails vitamins) AS IT INTERFERES WITH THE VALIDITY OF THYROID FUNCTION BLOOD TESTS.  7) Chronic Care/Health Maintenance: -Patient is on ACEI/ARB and Statin medications and encouraged to continue  to follow up with Ophthalmology, Podiatrist at least yearly or according to recommendations, and advised to   stay away from smoking. I have recommended yearly flu vaccine and pneumonia vaccination at least every 5 years;  60 minutes of walking 4 days a week is recommended for her , and  sleep for at least 7 hours a day.   - I advised patient to maintain close follow up with Benetta Spar, MD for primary care needs.     I spent  22  minutes in the care of the patient today including review of labs from CMP, Lipids, Thyroid Function, Hematology (current and previous including abstractions from other facilities); face-to-face time discussing  her blood glucose readings/logs, discussing hypoglycemia and hyperglycemia episodes and symptoms, medications doses, her options of short and long term treatment based on the latest standards of care / guidelines;  discussion about incorporating lifestyle medicine;  and documenting the encounter. Risk reduction counseling performed per USPSTF guidelines to reduce obesity and cardiovascular risk factors.     Please refer to Patient Instructions for Blood Glucose Monitoring and Insulin/Medications Dosing Guide"  in media tab for additional information. Please  also refer to " Patient Self Inventory" in the Media  tab for reviewed elements of pertinent patient history.  Ashley Blanchard participated in the discussions, expressed understanding, and voiced agreement with the above plans.  All questions were answered to her satisfaction. she is encouraged to contact clinic should she have any questions or concerns prior to her return visit.   Follow up plan: - Return in about 4 months (around 04/28/2024) for Diabetes F/U with A1c in office, Previsit labs,  Bring meter and logs.  Ronny Bacon, [facility], [facility] [address] [place], [place] [address] Phone: [phone] Fax: [phone]  12/30/2023, 9:49 AM

## 2024-02-18 ENCOUNTER — Other Ambulatory Visit: Payer: Self-pay | Admitting: Nurse Practitioner

## 2024-03-16 ENCOUNTER — Other Ambulatory Visit: Payer: Self-pay | Admitting: Nurse Practitioner

## 2024-03-20 ENCOUNTER — Other Ambulatory Visit: Payer: Self-pay | Admitting: Nurse Practitioner

## 2024-03-23 ENCOUNTER — Other Ambulatory Visit: Payer: Self-pay

## 2024-03-23 DIAGNOSIS — N1831 Chronic kidney disease, stage 3a: Secondary | ICD-10-CM

## 2024-03-23 MED ORDER — LANCETS 33G MISC: 2 refills | Status: DC

## 2024-03-23 MED ORDER — HUMULIN R U-500 KWIKPEN 500 UNIT/ML ~~LOC~~ SOPN: PEN_INJECTOR | SUBCUTANEOUS | 0 refills | Status: DC

## 2024-04-22 ENCOUNTER — Other Ambulatory Visit (HOSPITAL_COMMUNITY): Payer: Self-pay | Admitting: Internal Medicine

## 2024-04-22 DIAGNOSIS — Z1231 Encounter for screening mammogram for malignant neoplasm of breast: Secondary | ICD-10-CM

## 2024-04-22 LAB — LIPID PANEL
Chol/HDL Ratio: 3.9 ratio (ref 0.0–4.4)
Cholesterol, Total: 185 mg/dL (ref 100–199)
HDL: 48 mg/dL (ref >39)
LDL Chol Calc (NIH): 118 mg/dL — ABNORMAL HIGH (ref 0–99)
Triglycerides: 102 mg/dL (ref 0–149)
VLDL Cholesterol Cal: 19 mg/dL (ref 5–40)

## 2024-04-22 LAB — COMPREHENSIVE METABOLIC PANEL WITH GFR
ALT: 17 IU/L (ref 0–32)
AST: 18 IU/L (ref 0–40)
Albumin: 3.7 g/dL — ABNORMAL LOW (ref 3.8–4.9)
Alkaline Phosphatase: 126 IU/L — ABNORMAL HIGH (ref 44–121)
BUN/Creatinine Ratio: 19 (ref 9–23)
BUN: 25 mg/dL — ABNORMAL HIGH (ref 6–24)
Bilirubin Total: 0.3 mg/dL (ref 0.0–1.2)
CO2: 23 mmol/L (ref 20–29)
Calcium: 10.6 mg/dL — ABNORMAL HIGH (ref 8.7–10.2)
Chloride: 99 mmol/L (ref 96–106)
Creatinine, Ser: 1.34 mg/dL — ABNORMAL HIGH (ref 0.57–1.00)
Globulin, Total: 2.7 g/dL (ref 1.5–4.5)
Glucose: 101 mg/dL — ABNORMAL HIGH (ref 70–99)
Potassium: 4.4 mmol/L (ref 3.5–5.2)
Sodium: 140 mmol/L (ref 134–144)
Total Protein: 6.4 g/dL (ref 6.0–8.5)
eGFR: 46 mL/min/1.73 — ABNORMAL LOW

## 2024-04-22 LAB — T4, FREE: Free T4: 0.99 ng/dL (ref 0.82–1.77)

## 2024-04-22 LAB — TSH: TSH: 4.56 u[IU]/mL — ABNORMAL HIGH (ref 0.450–4.500)

## 2024-04-22 LAB — VITAMIN D 25 HYDROXY (VIT D DEFICIENCY, FRACTURES): Vit D, 25-Hydroxy: 81 ng/mL (ref 30.0–100.0)

## 2024-04-28 ENCOUNTER — Ambulatory Visit (INDEPENDENT_AMBULATORY_CARE_PROVIDER_SITE_OTHER): Payer: Medicare Other | Admitting: Nurse Practitioner

## 2024-04-28 ENCOUNTER — Encounter: Payer: Self-pay | Admitting: Nurse Practitioner

## 2024-04-28 VITALS — BP 138/78 | HR 103 | Ht 60.0 in | Wt 229.6 lb

## 2024-04-28 DIAGNOSIS — E782 Mixed hyperlipidemia: Secondary | ICD-10-CM

## 2024-04-28 DIAGNOSIS — N1831 Chronic kidney disease, stage 3a: Secondary | ICD-10-CM

## 2024-04-28 DIAGNOSIS — Z794 Long term (current) use of insulin: Secondary | ICD-10-CM

## 2024-04-28 DIAGNOSIS — E1122 Type 2 diabetes mellitus with diabetic chronic kidney disease: Secondary | ICD-10-CM | POA: Diagnosis not present

## 2024-04-28 DIAGNOSIS — I10 Essential (primary) hypertension: Secondary | ICD-10-CM | POA: Diagnosis not present

## 2024-04-28 DIAGNOSIS — Z7984 Long term (current) use of oral hypoglycemic drugs: Secondary | ICD-10-CM

## 2024-04-28 DIAGNOSIS — E559 Vitamin D deficiency, unspecified: Secondary | ICD-10-CM | POA: Diagnosis not present

## 2024-04-28 DIAGNOSIS — E039 Hypothyroidism, unspecified: Secondary | ICD-10-CM

## 2024-04-28 LAB — POCT GLYCOSYLATED HEMOGLOBIN (HGB A1C): Hemoglobin A1C: 7.3 % — AB (ref 4.0–5.6)

## 2024-04-28 MED ORDER — HUMULIN R U-500 KWIKPEN 500 UNIT/ML ~~LOC~~ SOPN: PEN_INJECTOR | SUBCUTANEOUS | 3 refills | Status: DC

## 2024-04-28 MED ORDER — LEVOTHYROXINE SODIUM 50 MCG PO TABS: 50.0000 ug | ORAL_TABLET | Freq: Every day | ORAL | 1 refills | Status: DC

## 2024-04-28 NOTE — Progress Notes (Signed)
 04/28/2024  Endocrinology follow-up note   Subjective:    Patient ID: Ashley Blanchard, female    DOB: [birth year].   She is being seen in follow-up for history of uncontrolled type 2 diabetes, hyperlipidemia, hypertension. She is accompanied by nursing staff who is not immersed in her medical condition.  PMD:   Wyvonna Heidelberg, MD  Past Medical History:  Diagnosis Date   Chronic anemia    Diabetes mellitus, type II (HCC)    Hearing loss    Hyperlipidemia    Hypertension    Mild mental retardation    Past Surgical History:  Procedure Laterality Date   CATARACT EXTRACTION     RETINAL DETACHMENT SURGERY     Social History   Socioeconomic History   Marital status: Single    Spouse name: Not on file   Number of children: Not on file   Years of education: Not on file   Highest education level: Not on file  Occupational History   Not on file  Tobacco Use   Smoking status: Never   Smokeless tobacco: Never  Vaping Use   Vaping status: Never Used  Substance and Sexual Activity   Alcohol use: No   Drug use: No   Sexual activity: Never    Birth control/protection: Post-menopausal  Other Topics Concern   Not on file  Social History Narrative   Not on file   Social Drivers of Health   Financial Resource Strain: Not on file  Food Insecurity: Not on file  Transportation Needs: No Transportation Needs (07/16/2023)   PRAPARE - Transportation    Lack of Transportation (Medical): No    Lack of Transportation (Non-Medical): No  Physical Activity: Not on file  Stress: Not on file  Social Connections: Not on file   Outpatient Encounter Medications as of 04/28/2024  Medication Sig   ALLERGY RELIEF 10 MG tablet Take 10 mg by mouth daily.   aspirin  EC 81 MG tablet Take 81 mg by mouth daily.   atorvastatin  (LIPITOR) 40 MG tablet Take 1 tablet (40 mg total) by mouth daily.   Dextrose , Diabetic Use, (TRUEPLUS GLUCOSE PO) Take by mouth.   diltiazem  (CARDIZEM  CD) 180 MG 24 hr  capsule Take 180 mg by mouth daily.   DROPSAFE SAFETY PEN NEEDLES 31G X 6 MM MISC USE TO INJECT INSULIN  THREE TIMES DAILY.   EASYMAX TEST test strip USE TO CHECK BLOOD SUGAR 4 TIMES DAILY BEFORE BREAKFAST,LUNCH,SUPPER AND AT BEDTIME.(HOLD INSULIN  IF BS<90:CALL MD IF BS>400)   FEROSUL 325 (65 Fe) MG tablet Take by mouth.   furosemide (LASIX) 20 MG tablet Take 20 mg by mouth.   GLIPIZIDE  XL 5 MG 24 hr tablet TAKE 1 TABLET BY MOUTH DAILY AT BREAKFAST.   Lancets 33G MISC USE TO CHECK BLOOD GLUCOSE FOUR TIMES DAILY AS INSTRUCTED   metFORMIN  (GLUCOPHAGE ) 500 MG tablet Take by mouth 2 (two) times daily with a meal.   ramipril (ALTACE) 5 MG capsule Take 5 mg by mouth daily.   risperiDONE (RISPERDAL) 1 MG tablet Take 1 mg by mouth at bedtime.   sertraline (ZOLOFT) 100 MG tablet Take 100 mg by mouth daily.   TRADJENTA 5 MG TABS tablet Take 5 mg by mouth daily.   traZODone  (DESYREL ) 50 MG tablet Take 50 mg by mouth at bedtime.   TRUEPLUS GLUCOSE ON THE GO 4 g chewable tablet Chew by mouth.   Vitamin D , Ergocalciferol , (DRISDOL ) 1.25 MG (50000 UNIT) CAPS capsule Take 1 capsule (50,000 Units total)  by mouth every 7 (seven) days.   [DISCONTINUED] HUMULIN  R U-500 KWIKPEN 500 UNIT/ML KwikPen INJECT 80 UNITS SQ WITH BREAKFAST & LUNCH IF GLUCOSE IS ABOVE 90 & EATING. INJECT 30 UNITS [place] WITH SUPPER IF GLUCOSE IS ABOVE 90 & EATING.   [DISCONTINUED] insulin  regular human CONCENTRATED (HUMULIN  R U-500 KWIKPEN) 500 UNIT/ML KwikPen INJECT 80 UNITS SQ WITH BREAKFAST & LUNCH IF GLUCOSE IS ABOVE 90 & EATING. INJECT 30 UNITS [place] WITH SUPPER IF GLUCOSE IS ABOVE 90 & EATING.   [DISCONTINUED] levothyroxine  (SYNTHROID ) 25 MCG tablet Take 1 tablet (25 mcg total) by mouth daily.   benzonatate (TESSALON) 100 MG capsule Take by mouth. (Patient not taking: Reported on 04/28/2024)   insulin  regular human CONCENTRATED (HUMULIN  R U-500 KWIKPEN) 500 UNIT/ML KwikPen INJECT 80 UNITS SQ WITH BREAKFAST & LUNCH IF GLUCOSE IS ABOVE 90 & EATING.  INJECT 30 UNITS [place] WITH SUPPER IF GLUCOSE IS ABOVE 90 & EATING.   levothyroxine  (SYNTHROID ) 50 MCG tablet Take 1 tablet (50 mcg total) by mouth daily.   No facility-administered encounter medications on file as of 04/28/2024.   ALLERGIES: No Known Allergies VACCINATION STATUS:  There is no immunization history on file for this patient.  Diabetes She presents for her follow-up diabetic visit. She has type 2 diabetes mellitus. Onset time: She is not sure when she was diagnosed with diabetes. Her disease course has been stable. There are no hypoglycemic associated symptoms. Pertinent negatives for hypoglycemia include no confusion, headaches, pallor or seizures. Pertinent negatives for diabetes include no chest pain, no fatigue, no polydipsia, no polyphagia and no polyuria. There are no hypoglycemic complications. Symptoms are stable. Diabetic complications include nephropathy. Risk factors for coronary artery disease include diabetes mellitus, hypertension, dyslipidemia, obesity and sedentary lifestyle. Current diabetic treatment includes intensive insulin  program and oral agent (triple therapy). She is compliant with treatment most of the time (She is in a nursing home due to failure to care for self.). Her weight is fluctuating minimally. She is following a generally unhealthy diet. When asked about meal planning, she reported none. She has not had a previous visit with a dietitian. She rarely participates in exercise. Her home blood glucose trend is fluctuating minimally. (She presents today, accompanied by care attendant from the nursing home, with her logs showing fluctuating but stable glycemic profile overall.  Her POCT A1c today is 7.3%, unchanged from last visit.  She has not had any significant hypoglycemia documented.) An ACE inhibitor/angiotensin II receptor blocker is being taken. She sees a podiatrist.Eye exam is current.  Hypertension This is a chronic problem. The current episode started  more than 1 year ago. The problem has been gradually improving since onset. The problem is controlled. Pertinent negatives include no chest pain, headaches, palpitations or shortness of breath. There are no associated agents to hypertension. Risk factors for coronary artery disease include dyslipidemia, diabetes mellitus, obesity and sedentary lifestyle. Past treatments include ACE inhibitors and diuretics. The current treatment provides moderate improvement. There are no compliance problems.  Hypertensive end-organ damage includes kidney disease. Identifiable causes of hypertension include chronic renal disease.  Hyperlipidemia This is a chronic problem. The current episode started more than 1 year ago. The problem is uncontrolled. Recent lipid tests were reviewed and are high. Exacerbating diseases include chronic renal disease, diabetes and obesity. Pertinent negatives include no chest pain, myalgias or shortness of breath. Current antihyperlipidemic treatment includes statins. The current treatment provides mild improvement of lipids. Compliance problems include adherence to exercise.  Risk factors  for coronary artery disease include diabetes mellitus, dyslipidemia, hypertension, obesity and a sedentary lifestyle.    Review of systems  Constitutional: + minimally fluctuating body weight,  current Body mass index is 44.84 kg/m. , no fatigue, no subjective hyperthermia, no subjective hypothermia Eyes: no blurry vision, no xerophthalmia ENT: no sore throat, no nodules palpated in throat, no dysphagia/odynophagia, no hoarseness Cardiovascular: no chest pain, no shortness of breath, no palpitations, no leg swelling Respiratory: no cough, no shortness of breath Gastrointestinal: no nausea/vomiting/diarrhea Musculoskeletal: no muscle/joint aches Skin: no rashes, no hyperemia Neurological: no tremors, no numbness, no tingling, no dizziness Psychiatric: no depression, no anxiety, mild MR  Objective:     BP 138/78 (BP Location: Left Arm, Patient Position: Sitting, Cuff Size: Large)   Pulse (!) 103   Ht 5' (1.524 m)   Wt 229 lb 9.6 oz (104.1 kg)   LMP  (LMP Unknown)   BMI 44.84 kg/m   Wt Readings from Last 3 Encounters:  04/28/24 229 lb 9.6 oz (104.1 kg)  12/30/23 224 lb 12.8 oz (102 kg)  08/29/23 224 lb (101.6 kg)   BP Readings from Last 3 Encounters:  04/28/24 138/78  12/30/23 112/68  08/29/23 112/74     Physical Exam- Limited  Constitutional:  Body mass index is 43.9 kg/m. , not in acute distress, mild MR, not conversational- nods and gestures appropriately Eyes:  EOMI, no exophthalmos Musculoskeletal: no gross deformities, strength intact in all four extremities, no gross restriction of joint movements Skin:  no rashes, no hyperemia Neurological: no tremor with outstretched hands   Diabetic Foot Exam - Simple   No data filed      Lipid Panel     Component Value Date/Time   CHOL 185 04/21/2024 0824   TRIG 102 04/21/2024 0824   HDL 48 04/21/2024 0824   CHOLHDL 3.9 04/21/2024 0824   CHOLHDL 4.0 05/19/2020 0820   LDLCALC 118 (H) 04/21/2024 0824   LDLCALC 129 (H) 05/19/2020 0820   Recent Results (from the past 2160 hours)  Comprehensive metabolic panel     Status: Abnormal   Collection Time: 04/21/24  8:24 AM  Result Value Ref Range   Glucose 101 (H) 70 - 99 mg/dL   BUN 25 (H) 6 - 24 mg/dL   Creatinine, Ser 1.61 (H) 0.57 - 1.00 mg/dL   eGFR 46 (L) >09 UE/AVW/0.98   BUN/Creatinine Ratio 19 9 - 23   Sodium 140 134 - 144 mmol/L   Potassium 4.4 3.5 - 5.2 mmol/L   Chloride 99 96 - 106 mmol/L   CO2 23 20 - 29 mmol/L   Calcium  10.6 (H) 8.7 - 10.2 mg/dL   Total Protein 6.4 6.0 - 8.5 g/dL   Albumin 3.7 (L) 3.8 - 4.9 g/dL   Globulin, Total 2.7 1.5 - 4.5 g/dL   Bilirubin Total 0.3 0.0 - 1.2 mg/dL   Alkaline Phosphatase 126 (H) 44 - 121 IU/L   AST 18 0 - 40 IU/L   ALT 17 0 - 32 IU/L  Lipid panel     Status: Abnormal   Collection Time: 04/21/24  8:24 AM  Result  Value Ref Range   Cholesterol, Total 185 100 - 199 mg/dL   Triglycerides 119 0 - 149 mg/dL   HDL 48 >14 mg/dL   VLDL Cholesterol Cal 19 5 - 40 mg/dL   LDL Chol Calc (NIH) 782 (H) 0 - 99 mg/dL   Chol/HDL Ratio 3.9 0.0 - 4.4 ratio    Comment:  T. Chol/HDL Ratio                                             Men  Women                               1/2 Avg.Risk  3.4    3.3                                   Avg.Risk  5.0    4.4                                2X Avg.Risk  9.6    7.1                                3X Avg.Risk 23.4   11.0   TSH     Status: Abnormal   Collection Time: 04/21/24  8:24 AM  Result Value Ref Range   TSH 4.560 (H) 0.450 - 4.500 uIU/mL  T4, free     Status: None   Collection Time: 04/21/24  8:24 AM  Result Value Ref Range   Free T4 0.99 0.82 - 1.77 ng/dL  VITAMIN D  25 Hydroxy (Vit-D Deficiency, Fractures)     Status: None   Collection Time: 04/21/24  8:24 AM  Result Value Ref Range   Vit D, 25-Hydroxy 81.0 30.0 - 100.0 ng/mL    Comment: Vitamin D  deficiency has been defined by the Institute of Medicine and an Endocrine Society practice guideline as a level of serum 25-OH vitamin D  less than 20 ng/mL (1,2). The Endocrine Society went on to further define vitamin D  insufficiency as a level between 21 and 29 ng/mL (2). 1. IOM (Institute of Medicine). 2010. Dietary reference    intakes for calcium  and D. [place]: The    [employer]. 2. Holick MF, Binkley [place], Bischoff-Ferrari HA, et al.    Evaluation, treatment, and prevention of vitamin D     deficiency: an Endocrine Society clinical practice    guideline. JCEM. 2011 Jul; 96(7):1911-30.   HgB A1c     Status: Abnormal   Collection Time: 04/28/24  9:41 AM  Result Value Ref Range   Hemoglobin A1C 7.3 (A) 4.0 - 5.6 %   HbA1c POC (<> result, manual entry)     HbA1c, POC (prediabetic range)     HbA1c, POC (controlled diabetic range)         Assessment & Plan:    1) Controlled type 2 diabetes mellitus with complication, without long-term current use of insulin  (HCC)  She presents today, accompanied by care attendant from the nursing home, with her logs showing fluctuating but stable glycemic profile overall.  Her POCT A1c today is 7.3%, unchanged from last visit.  She has not had any significant hypoglycemia documented.  - She has complications from diabetes including CKD however patient remains at a high risk for more acute and chronic complications of diabetes which include CAD, CVA, CKD, retinopathy, and neuropathy.   -  She does not understand the high risk she has, these are all discussed with her care  manager from the group home.  - Nutritional counseling repeated at each appointment due to patients tendency to fall back in to old habits.  - The patient admits there is a room for improvement in their diet and drink choices. -  Suggestion is made for the patient to avoid simple carbohydrates from their diet including Cakes, Sweet Desserts / Pastries, Ice Cream, Soda (diet and regular), Sweet Tea, Candies, Chips, Cookies, Sweet Pastries, Store Bought Juices, Alcohol in Excess of 1-2 drinks a day, Artificial Sweeteners, Coffee Creamer, and Sugar-free Products. This will help patient to have stable blood glucose profile and potentially avoid unintended weight gain.   - I encouraged the patient to switch to unprocessed or minimally processed complex starch and increased protein intake (animal or plant source), fruits, and vegetables.   - Patient is advised to stick to a routine mealtimes to eat 3 meals a day and avoid unnecessary snacks (to snack only to correct hypoglycemia).  - I have approached patient with the following individualized plan to manage diabetes and patient agrees:   - Number 1 priority in her case is to avoid hypoglycemia.  -She will continue to need intensive treatment with higher dose of insulin  in order for her to achieve and  maintain control of diabetes to target.   -Given her stable, improved glycemic profile overall, no changes will be made to her medication regimen today.  She is advised to continue her Humulin  R- U500 to 80 units with breakfast and lunch and continue her 30 units with supper, if glucose is above 90 and she is eating, continue Metformin  500 mg po twice daily with meals, Glipizide  5 mg XL daily with breakfast, and Tradjenta 5 mg po daily.   -She is encouraged to continue monitoring blood glucose 4 times daily, before meals and before bed, and to call the clinic if she has readings less than 70 or greater than 300 for 3 tests in a row.  - She is not suitable candidate for SGLT2 inhibitors.  - Patient specific target  A1c;  LDL, HDL, Triglycerides,  were discussed in detail.  2) BP/HTN :  Her blood pressure is controlled to target.   She is advised to continue her current blood pressure medications including Ramipril 5 mg p.o. daily with breakfast, and Lasix 20 mg po daily.    3) Lipids/HPL:  Her most recent lipid panel from 04/21/24 shows uncontrolled LDL of 118.  She is advised to continue Lipitor 40 mg po daily at bedtime.  Side effects and precautions discussed with her.    4) Vitamin D  deficiency:  Her most recent vitamin D  level was 81 on 04/21/24.  I discussed and initiated Ergocalciferol  50000 units po weekly x 12 weeks on previous visit.  She can continue this for now.  5)  Weight/Diet:  Her Body mass index is 44.84 kg/m.  This clearly is complicating her diabetes care.  She is a candidate for modest weight loss.  She cannot exercise optimally.   Exercise, and detailed carbohydrates information provided.  6) Abnormal TSH/ Hypothyroidism Her recent thyroid labs are suggestive of hypothyroidism.   -Her previsit TFTs are consistent with slight under-replacement.  Will increase Levothyroxine  to 50 mcg po daily before breakfast. Will recheck TFTs prior to next visit and adjust dose  accordingly.   - The correct intake of thyroid hormone (Levothyroxine , Synthroid ), is on empty stomach first thing in the morning, with water, separated by at least 30 minutes from breakfast and other medications,  and separated by more than 4 hours from calcium , iron, multivitamins, acid reflux medications (PPIs).  - This medication is a life-long medication and will be needed to correct thyroid hormone imbalances for the rest of your life.  The dose may change from time to time, based on thyroid blood work.  - It is extremely important to be consistent taking this medication, near the same time each morning.  -AVOID TAKING PRODUCTS CONTAINING BIOTIN (commonly found in Hair, Skin, Nails vitamins) AS IT INTERFERES WITH THE VALIDITY OF THYROID FUNCTION BLOOD TESTS.  7) Chronic Care/Health Maintenance: -Patient is on ACEI/ARB and Statin medications and encouraged to continue to follow up with Ophthalmology, Podiatrist at least yearly or according to recommendations, and advised to   stay away from smoking. I have recommended yearly flu vaccine and pneumonia vaccination at least every 5 years;  60 minutes of walking 4 days a week is recommended for her , and  sleep for at least 7 hours a day.   - I advised patient to maintain close follow up with Fanta, Tesfaye Demissie, MD for primary care needs.  Calcium  level was elevated on recent CMP, will recheck prior to next visit.   I spent  43  minutes in the care of the patient today including review of labs from CMP, Lipids, Thyroid Function, Hematology (current and previous including abstractions from other facilities); face-to-face time discussing  her blood glucose readings/logs, discussing hypoglycemia and hyperglycemia episodes and symptoms, medications doses, her options of short and long term treatment based on the latest standards of care / guidelines;  discussion about incorporating lifestyle medicine;  and documenting the encounter. Risk  reduction counseling performed per USPSTF guidelines to reduce obesity and cardiovascular risk factors.     Please refer to Patient Instructions for Blood Glucose Monitoring and Insulin /Medications Dosing Guide  in media tab for additional information. Please  also refer to  Patient Self Inventory in the Media  tab for reviewed elements of pertinent patient history.  Ashley Blanchard participated in the discussions, expressed understanding, and voiced agreement with the above plans.  All questions were answered to her satisfaction. she is encouraged to contact clinic should she have any questions or concerns prior to her return visit.   Follow up plan: - Return in about 4 months (around 08/28/2024) for Diabetes F/U with A1c in office, Thyroid follow up, Previsit labs, Bring meter and logs.  Hulon Magic, [facility] [address] [place], [place] [address] Phone: [phone] Fax: [phone]  04/28/2024, 9:54 AM

## 2024-05-05 ENCOUNTER — Other Ambulatory Visit: Payer: Self-pay | Admitting: Nurse Practitioner

## 2024-05-07 ENCOUNTER — Encounter (INDEPENDENT_AMBULATORY_CARE_PROVIDER_SITE_OTHER): Payer: Self-pay | Admitting: *Deleted

## 2024-06-18 ENCOUNTER — Ambulatory Visit (HOSPITAL_COMMUNITY)
Admission: RE | Admit: 2024-06-18 | Discharge: 2024-06-18 | Disposition: A | Discharge status: Home / Self Care | Source: Ambulatory Visit | Attending: Internal Medicine | Admitting: Internal Medicine

## 2024-06-18 ENCOUNTER — Encounter (HOSPITAL_COMMUNITY): Payer: Self-pay

## 2024-06-18 DIAGNOSIS — Z1231 Encounter for screening mammogram for malignant neoplasm of breast: Secondary | ICD-10-CM | POA: Diagnosis present

## 2024-06-23 ENCOUNTER — Other Ambulatory Visit: Payer: Self-pay | Admitting: Nurse Practitioner

## 2024-07-03 ENCOUNTER — Other Ambulatory Visit: Payer: Self-pay | Admitting: Nurse Practitioner

## 2024-07-07 ENCOUNTER — Other Ambulatory Visit: Payer: Self-pay | Admitting: *Deleted

## 2024-07-07 DIAGNOSIS — N1831 Chronic kidney disease, stage 3a: Secondary | ICD-10-CM

## 2024-07-07 MED ORDER — LANCETS 33G MISC: 5 refills | Status: AC

## 2024-08-07 ENCOUNTER — Other Ambulatory Visit: Payer: Self-pay | Admitting: Nurse Practitioner

## 2024-08-07 DIAGNOSIS — E559 Vitamin D deficiency, unspecified: Secondary | ICD-10-CM

## 2024-08-25 LAB — COMPREHENSIVE METABOLIC PANEL WITH GFR
ALT: 19 IU/L (ref 0–32)
AST: 19 IU/L (ref 0–40)
Albumin: 3.7 g/dL — ABNORMAL LOW (ref 3.8–4.9)
Alkaline Phosphatase: 104 IU/L (ref 49–135)
BUN/Creatinine Ratio: 15 (ref 12–28)
BUN: 20 mg/dL (ref 8–27)
Bilirubin Total: 0.2 mg/dL (ref 0.0–1.2)
CO2: 25 mmol/L (ref 20–29)
Calcium: 10.1 mg/dL (ref 8.7–10.3)
Chloride: 100 mmol/L (ref 96–106)
Creatinine, Ser: 1.32 mg/dL — ABNORMAL HIGH (ref 0.57–1.00)
Globulin, Total: 2.7 g/dL (ref 1.5–4.5)
Glucose: 221 mg/dL — ABNORMAL HIGH (ref 70–99)
Potassium: 4.5 mmol/L (ref 3.5–5.2)
Sodium: 138 mmol/L (ref 134–144)
Total Protein: 6.4 g/dL (ref 6.0–8.5)
eGFR: 46 mL/min/1.73 — ABNORMAL LOW (ref >59)

## 2024-08-25 LAB — MICROALBUMIN / CREATININE URINE RATIO
Creatinine, Urine: 187.9 mg/dL
Microalb/Creat Ratio: 2 mg/g{creat} (ref 0–29)
Microalbumin, Urine: 3.5 ug/mL

## 2024-08-25 LAB — TSH: TSH: 2.68 u[IU]/mL (ref 0.450–4.500)

## 2024-08-25 LAB — T4, FREE: Free T4: 0.95 ng/dL (ref 0.82–1.77)

## 2024-08-31 ENCOUNTER — Encounter: Payer: Self-pay | Admitting: Nurse Practitioner

## 2024-08-31 ENCOUNTER — Ambulatory Visit (INDEPENDENT_AMBULATORY_CARE_PROVIDER_SITE_OTHER): Admitting: Nurse Practitioner

## 2024-08-31 VITALS — BP 102/74 | HR 103 | Ht 60.0 in | Wt 227.0 lb

## 2024-08-31 DIAGNOSIS — N1831 Chronic kidney disease, stage 3a: Secondary | ICD-10-CM

## 2024-08-31 DIAGNOSIS — I10 Essential (primary) hypertension: Secondary | ICD-10-CM | POA: Diagnosis not present

## 2024-08-31 DIAGNOSIS — E1122 Type 2 diabetes mellitus with diabetic chronic kidney disease: Secondary | ICD-10-CM

## 2024-08-31 DIAGNOSIS — Z7984 Long term (current) use of oral hypoglycemic drugs: Secondary | ICD-10-CM

## 2024-08-31 DIAGNOSIS — Z794 Long term (current) use of insulin: Secondary | ICD-10-CM

## 2024-08-31 DIAGNOSIS — E559 Vitamin D deficiency, unspecified: Secondary | ICD-10-CM

## 2024-08-31 DIAGNOSIS — E782 Mixed hyperlipidemia: Secondary | ICD-10-CM

## 2024-08-31 DIAGNOSIS — E039 Hypothyroidism, unspecified: Secondary | ICD-10-CM

## 2024-08-31 LAB — POCT GLYCOSYLATED HEMOGLOBIN (HGB A1C): Hemoglobin A1C: 8.1 % — AB (ref 4.0–5.6)

## 2024-08-31 NOTE — Progress Notes (Signed)
 08/31/2024  Endocrinology follow-up note   Subjective:    Patient ID: Ashley Blanchard, female    DOB: 1966-04-28.   She is being seen in follow-up for history of uncontrolled type 2 diabetes, hyperlipidemia, hypertension. She is accompanied by nursing staff who is not immersed in her medical condition.  PMD:   Carlette Benita Area, MD  Past Medical History:  Diagnosis Date   Chronic anemia    Diabetes mellitus, type II (HCC)    Hearing loss    Hyperlipidemia    Hypertension    Mild mental retardation    Past Surgical History:  Procedure Laterality Date   CATARACT EXTRACTION     RETINAL DETACHMENT SURGERY     Social History   Socioeconomic History   Marital status: Single    Spouse name: Not on file   Number of children: Not on file   Years of education: Not on file   Highest education level: Not on file  Occupational History   Not on file  Tobacco Use   Smoking status: Never   Smokeless tobacco: Never  Vaping Use   Vaping status: Never Used  Substance and Sexual Activity   Alcohol use: No   Drug use: No   Sexual activity: Never    Birth control/protection: Post-menopausal  Other Topics Concern   Not on file  Social History Narrative   Not on file   Social Drivers of Health   Financial Resource Strain: Not on file  Food Insecurity: Not on file  Transportation Needs: No Transportation Needs (07/16/2023)   PRAPARE - Transportation    Lack of Transportation (Medical): No    Lack of Transportation (Non-Medical): No  Physical Activity: Not on file  Stress: Not on file  Social Connections: Not on file   Outpatient Encounter Medications as of 08/31/2024  Medication Sig   ALLERGY RELIEF 10 MG tablet Take 10 mg by mouth daily.   aspirin  EC 81 MG tablet Take 81 mg by mouth daily.   atorvastatin  (LIPITOR) 40 MG tablet Take 1 tablet (40 mg total) by mouth daily.   Dextrose , Diabetic Use, (TRUEPLUS GLUCOSE PO) Take by mouth.   diltiazem  (CARDIZEM  CD) 180 MG 24  hr capsule Take 180 mg by mouth daily.   DROPSAFE SAFETY PEN NEEDLES 31G X 6 MM MISC USE TO INJECT INSULIN  THREE TIMES DAILY.   EASYMAX TEST test strip USE TO CHECK BLOOD SUGAR 4 TIMES DAILY BEFORE BREAKFAST,LUNCH,SUPPER AND AT BEDTIME.(HOLD INSULIN  IF BS<90:CALL MD IF BS>400)   FEROSUL 325 (65 Fe) MG tablet Take by mouth.   furosemide (LASIX) 20 MG tablet Take 20 mg by mouth.   GLIPIZIDE  XL 5 MG 24 hr tablet TAKE 1 TABLET BY MOUTH DAILY AT BREAKFAST.   insulin  regular human CONCENTRATED (HUMULIN  R U-500 KWIKPEN) 500 UNIT/ML KwikPen INJECT 80 UNITS SQ WITH BREAKFAST & LUNCH IF GLUCOSE IS ABOVE 90 & EATING. INJECT 30 UNITS [place] WITH SUPPER IF GLUCOSE IS ABOVE 90 & EATING.   Lancets 33G MISC USE TO CHECK BLOOD GLUCOSE FOUR TIMES DAILY AS INSTRUCTED   levothyroxine  (SYNTHROID ) 50 MCG tablet Take 1 tablet (50 mcg total) by mouth daily.   metFORMIN  (GLUCOPHAGE ) 500 MG tablet Take by mouth 2 (two) times daily with a meal.   ramipril (ALTACE) 5 MG capsule Take 5 mg by mouth daily.   risperiDONE (RISPERDAL) 1 MG tablet Take 1 mg by mouth at bedtime.   sertraline (ZOLOFT) 100 MG tablet Take 100 mg by mouth daily.  TRADJENTA 5 MG TABS tablet Take 5 mg by mouth daily.   traZODone  (DESYREL ) 50 MG tablet Take 50 mg by mouth at bedtime.   TRUEPLUS GLUCOSE ON THE GO 4 g chewable tablet Chew by mouth.   Vitamin D , Ergocalciferol , (DRISDOL ) 1.25 MG (50000 UNIT) CAPS capsule TAKE 1 CAPSULE BY MOUTH ONCE A WEEK.   benzonatate (TESSALON) 100 MG capsule Take by mouth. (Patient not taking: Reported on 08/31/2024)   No facility-administered encounter medications on file as of 08/31/2024.   ALLERGIES: No Known Allergies VACCINATION STATUS:  There is no immunization history on file for this patient.  Diabetes She presents for her follow-up diabetic visit. She has type 2 diabetes mellitus. Onset time: She is not sure when she was diagnosed with diabetes. Her disease course has been stable. There are no hypoglycemic  associated symptoms. Pertinent negatives for hypoglycemia include no confusion, pallor or seizures. Pertinent negatives for diabetes include no fatigue, no polydipsia, no polyphagia and no polyuria. There are no hypoglycemic complications. Symptoms are stable. Diabetic complications include nephropathy. Risk factors for coronary artery disease include diabetes mellitus, hypertension, dyslipidemia, obesity and sedentary lifestyle. Current diabetic treatment includes intensive insulin  program and oral agent (triple therapy). She is compliant with treatment most of the time (She is in a nursing home due to failure to care for self.). Her weight is fluctuating minimally. She is following a generally unhealthy diet. When asked about meal planning, she reported none. She has not had a previous visit with a dietitian. She rarely participates in exercise. Her home blood glucose trend is fluctuating minimally. (She presents today, accompanied by care attendant from the nursing home, with her logs showing fluctuating but stable glycemic profile overall.  Her POCT A1c today is 8.1%, increasing from last visit of 7.3%, unchanged from last visit.  She has had mild hypoglycemia noted before bedtime on a few occasions.  There is no real pattern to her glucose fluctuations.) An ACE inhibitor/angiotensin II receptor blocker is being taken. She sees a podiatrist.Eye exam is current.    Review of systems  Constitutional: + minimally fluctuating body weight,  current Body mass index is 44.33 kg/m. , no fatigue, no subjective hyperthermia, no subjective hypothermia Eyes: no blurry vision, no xerophthalmia ENT: no sore throat, no nodules palpated in throat, no dysphagia/odynophagia, no hoarseness Cardiovascular: no chest pain, no shortness of breath, no palpitations, no leg swelling Respiratory: no cough, no shortness of breath Gastrointestinal: no nausea/vomiting/diarrhea Musculoskeletal: no muscle/joint aches Skin: no  rashes, no hyperemia Neurological: no tremors, no numbness, no tingling, no dizziness Psychiatric: no depression, no anxiety, mild MR  Objective:    BP 102/74 (BP Location: Left Arm, Patient Position: Sitting, Cuff Size: Large)   Pulse (!) 103   Ht 5' (1.524 m)   Wt 227 lb (103 kg)   LMP  (LMP Unknown)   BMI 44.33 kg/m   Wt Readings from Last 3 Encounters:  08/31/24 227 lb (103 kg)  04/28/24 229 lb 9.6 oz (104.1 kg)  12/30/23 224 lb 12.8 oz (102 kg)   BP Readings from Last 3 Encounters:  08/31/24 102/74  04/28/24 138/78  12/30/23 112/68     Physical Exam- Limited  Constitutional:  Body mass index is 43.9 kg/m. , not in acute distress, mild MR, not conversational- nods and gestures appropriately Eyes:  EOMI, no exophthalmos Musculoskeletal: no gross deformities, strength intact in all four extremities, no gross restriction of joint movements Skin:  no rashes, no hyperemia Neurological: no tremor  with outstretched hands   Diabetic Foot Exam - Simple   Simple Foot Form Visual Inspection No deformities, no ulcerations, no other skin breakdown bilaterally: Yes Sensation Testing Intact to touch and monofilament testing bilaterally: Yes Pulse Check Posterior Tibialis and Dorsalis pulse intact bilaterally: Yes Comments      Lipid Panel     Component Value Date/Time   CHOL 185 04/21/2024 0824   TRIG 102 04/21/2024 0824   HDL 48 04/21/2024 0824   CHOLHDL 3.9 04/21/2024 0824   CHOLHDL 4.0 05/19/2020 0820   LDLCALC 118 (H) 04/21/2024 0824   LDLCALC 129 (H) 05/19/2020 0820   Recent Results (from the past 2160 hours)  Comprehensive metabolic panel with GFR     Status: Abnormal   Collection Time: 08/24/24  8:23 AM  Result Value Ref Range   Glucose 221 (H) 70 - 99 mg/dL   BUN 20 8 - 27 mg/dL   Creatinine, Ser 8.67 (H) 0.57 - 1.00 mg/dL   eGFR 46 (L) >40 fO/fpw/8.26   BUN/Creatinine Ratio 15 12 - 28   Sodium 138 134 - 144 mmol/L   Potassium 4.5 3.5 - 5.2 mmol/L    Chloride 100 96 - 106 mmol/L   CO2 25 20 - 29 mmol/L   Calcium  10.1 8.7 - 10.3 mg/dL   Total Protein 6.4 6.0 - 8.5 g/dL   Albumin 3.7 (L) 3.8 - 4.9 g/dL   Globulin, Total 2.7 1.5 - 4.5 g/dL   Bilirubin Total 0.2 0.0 - 1.2 mg/dL   Alkaline Phosphatase 104 49 - 135 IU/L   AST 19 0 - 40 IU/L   ALT 19 0 - 32 IU/L  TSH     Status: None   Collection Time: 08/24/24  8:23 AM  Result Value Ref Range   TSH 2.680 0.450 - 4.500 uIU/mL  T4, free     Status: None   Collection Time: 08/24/24  8:23 AM  Result Value Ref Range   Free T4 0.95 0.82 - 1.77 ng/dL  Microalbumin / creatinine urine ratio     Status: None   Collection Time: 08/24/24  8:23 AM  Result Value Ref Range   Creatinine, Urine 187.9 Not Estab. mg/dL   Microalbumin, Urine 3.5 Not Estab. ug/mL   Microalb/Creat Ratio 2 0 - 29 mg/g creat    Comment:                        Normal:                0 -  29                        Moderately increased: 30 - 300                        Severely increased:       >300        Assessment & Plan:   1) Controlled type 2 diabetes mellitus with complication, without long-term current use of insulin  (HCC)  She presents today, accompanied by care attendant from the nursing home, with her logs showing fluctuating but stable glycemic profile overall.  Her POCT A1c today is 8.1%, increasing from last visit of 7.3%, unchanged from last visit.  She has had mild hypoglycemia noted before bedtime on a few occasions.  There is no real pattern to her glucose fluctuations.  - She has complications from diabetes including CKD  however patient remains at a high risk for more acute and chronic complications of diabetes which include CAD, CVA, CKD, retinopathy, and neuropathy.   -  She does not understand the high risk she has, these are all discussed with her care manager from the group home.  - Nutritional counseling repeated at each appointment due to patients tendency to fall back in to old habits.  - The  patient admits there is a room for improvement in their diet and drink choices. -  Suggestion is made for the patient to avoid simple carbohydrates from their diet including Cakes, Sweet Desserts / Pastries, Ice Cream, Soda (diet and regular), Sweet Tea, Candies, Chips, Cookies, Sweet Pastries, Store Bought Juices, Alcohol in Excess of 1-2 drinks a day, Artificial Sweeteners, Coffee Creamer, and Sugar-free Products. This will help patient to have stable blood glucose profile and potentially avoid unintended weight gain.   - I encouraged the patient to switch to unprocessed or minimally processed complex starch and increased protein intake (animal or plant source), fruits, and vegetables.   - Patient is advised to stick to a routine mealtimes to eat 3 meals a day and avoid unnecessary snacks (to snack only to correct hypoglycemia).  - I have approached patient with the following individualized plan to manage diabetes and patient agrees:   - Number 1 priority in her case is to avoid hypoglycemia.  -She will continue to need intensive treatment with higher dose of insulin  in order for her to achieve and maintain control of diabetes to target.   -No changes will be made to her medication regimen today even though A1c is slightly worse than last visit.  There is no real pattern with fluctuations in her glucose.  She is advised to continue her Humulin  R- U500 to 80 units with breakfast and lunch and continue her 30 units with supper, if glucose is above 90 and she is eating, continue Metformin  500 mg po twice daily with meals, Glipizide  5 mg XL daily with breakfast, and Tradjenta 5 mg po daily.   -She is encouraged to continue monitoring blood glucose 4 times daily, before meals and before bed, and to call the clinic if she has readings less than 70 or greater than 300 for 3 tests in a row.  - She is not suitable candidate for SGLT2 inhibitors.  - Patient specific target  A1c;  LDL, HDL,  Triglycerides,  were discussed in detail.  2) BP/HTN :  Her blood pressure is controlled to target.   She is advised to continue her current blood pressure medications including Ramipril 5 mg p.o. daily with breakfast, and Lasix 20 mg po daily.    3) Lipids/HPL:  Her most recent lipid panel from 04/21/24 shows uncontrolled LDL of 118.  She is advised to continue Lipitor 40 mg po daily at bedtime.  Side effects and precautions discussed with her.    4) Vitamin D  deficiency:  Her most recent vitamin D  level was 81 on 04/21/24.  I discussed and initiated Ergocalciferol  50000 units po weekly x 12 weeks on previous visit.  She can continue this for now.  5)  Weight/Diet:  Her Body mass index is 44.33 kg/m.  This clearly is complicating her diabetes care.  She is a candidate for modest weight loss.  She cannot exercise optimally.   Exercise, and detailed carbohydrates information provided.  6) Abnormal TSH/ Hypothyroidism Her recent thyroid labs are suggestive of hypothyroidism.   -Her previsit TFTs are consistent with appropriate  hormone replacement.  She is advised to continue Levothyroxine  50 mcg po daily before breakfast. Will recheck TFTs prior to next visit and adjust dose accordingly.   - The correct intake of thyroid hormone (Levothyroxine , Synthroid ), is on empty stomach first thing in the morning, with water, separated by at least 30 minutes from breakfast and other medications,  and separated by more than 4 hours from calcium , iron, multivitamins, acid reflux medications (PPIs).  - This medication is a life-long medication and will be needed to correct thyroid hormone imbalances for the rest of your life.  The dose may change from time to time, based on thyroid blood work.  - It is extremely important to be consistent taking this medication, near the same time each morning.  -AVOID TAKING PRODUCTS CONTAINING BIOTIN (commonly found in Hair, Skin, Nails vitamins) AS IT INTERFERES WITH THE  VALIDITY OF THYROID FUNCTION BLOOD TESTS.  7) Chronic Care/Health Maintenance: -Patient is on ACEI/ARB and Statin medications and encouraged to continue to follow up with Ophthalmology, Podiatrist at least yearly or according to recommendations, and advised to   stay away from smoking. I have recommended yearly flu vaccine and pneumonia vaccination at least every 5 years;  60 minutes of walking 4 days a week is recommended for her , and  sleep for at least 7 hours a day.   - I advised patient to maintain close follow up with Fanta, Tesfaye Demissie, MD for primary care needs.     I spent  46  minutes in the care of the patient today including review of labs from CMP, Lipids, Thyroid Function, Hematology (current and previous including abstractions from other facilities); face-to-face time discussing  her blood glucose readings/logs, discussing hypoglycemia and hyperglycemia episodes and symptoms, medications doses, her options of short and long term treatment based on the latest standards of care / guidelines;  discussion about incorporating lifestyle medicine;  and documenting the encounter. Risk reduction counseling performed per USPSTF guidelines to reduce obesity and cardiovascular risk factors.     Please refer to Patient Instructions for Blood Glucose Monitoring and Insulin /Medications Dosing Guide  in media tab for additional information. Please  also refer to  Patient Self Inventory in the Media  tab for reviewed elements of pertinent patient history.  Seham Newkirk participated in the discussions, expressed understanding, and voiced agreement with the above plans.  All questions were answered to her satisfaction. she is encouraged to contact clinic should she have any questions or concerns prior to her return visit.   Follow up plan: - Return in about 3 months (around 12/01/2024) for Diabetes F/U with A1c in office, No previsit labs, Bring meter and logs.  Benton Rio,  [facility] [address] [place], [place] [address] Phone: [phone] Fax: [phone]  08/31/2024, 10:50 AM

## 2024-08-31 NOTE — Addendum Note (Signed)
 Addended by: KRYSTAL MADELIN HERO on: 08/31/2024 11:39 AM   Modules accepted: Orders

## 2024-09-02 ENCOUNTER — Other Ambulatory Visit: Payer: Self-pay | Admitting: Nurse Practitioner

## 2024-10-09 ENCOUNTER — Other Ambulatory Visit: Payer: Self-pay | Admitting: Nurse Practitioner

## 2024-10-30 ENCOUNTER — Other Ambulatory Visit: Payer: Self-pay | Admitting: Nurse Practitioner

## 2024-10-30 DIAGNOSIS — Z794 Long term (current) use of insulin: Secondary | ICD-10-CM

## 2024-11-02 ENCOUNTER — Other Ambulatory Visit: Payer: Self-pay | Admitting: Nurse Practitioner

## 2024-11-02 DIAGNOSIS — Z794 Long term (current) use of insulin: Secondary | ICD-10-CM

## 2024-11-09 ENCOUNTER — Encounter (INDEPENDENT_AMBULATORY_CARE_PROVIDER_SITE_OTHER): Payer: Self-pay | Admitting: *Deleted

## 2024-12-04 LAB — COMPREHENSIVE METABOLIC PANEL WITH GFR
ALT: 19 IU/L (ref 0–32)
AST: 22 IU/L (ref 0–40)
Albumin: 3.5 g/dL — ABNORMAL LOW (ref 3.8–4.9)
Alkaline Phosphatase: 105 IU/L (ref 49–135)
BUN/Creatinine Ratio: 13 (ref 12–28)
BUN: 19 mg/dL (ref 8–27)
Bilirubin Total: 0.3 mg/dL (ref 0.0–1.2)
CO2: 24 mmol/L (ref 20–29)
Calcium: 10.2 mg/dL (ref 8.7–10.3)
Chloride: 98 mmol/L (ref 96–106)
Creatinine, Ser: 1.47 mg/dL — ABNORMAL HIGH (ref 0.57–1.00)
Globulin, Total: 2.7 g/dL (ref 1.5–4.5)
Glucose: 205 mg/dL — ABNORMAL HIGH (ref 70–99)
Potassium: 5.3 mmol/L — ABNORMAL HIGH (ref 3.5–5.2)
Sodium: 138 mmol/L (ref 134–144)
Total Protein: 6.2 g/dL (ref 6.0–8.5)
eGFR: 41 mL/min/1.73 — ABNORMAL LOW

## 2024-12-04 LAB — TSH: TSH: 1.66 u[IU]/mL (ref 0.450–4.500)

## 2024-12-04 LAB — T4, FREE: Free T4: 1.09 ng/dL (ref 0.82–1.77)

## 2024-12-10 ENCOUNTER — Ambulatory Visit: Admitting: Nurse Practitioner

## 2024-12-10 ENCOUNTER — Encounter: Payer: Self-pay | Admitting: Nurse Practitioner

## 2024-12-10 VITALS — BP 108/66 | HR 102 | Ht 60.0 in | Wt 227.4 lb

## 2024-12-10 DIAGNOSIS — E1122 Type 2 diabetes mellitus with diabetic chronic kidney disease: Secondary | ICD-10-CM

## 2024-12-10 DIAGNOSIS — E039 Hypothyroidism, unspecified: Secondary | ICD-10-CM

## 2024-12-10 DIAGNOSIS — Z7984 Long term (current) use of oral hypoglycemic drugs: Secondary | ICD-10-CM | POA: Diagnosis not present

## 2024-12-10 DIAGNOSIS — E559 Vitamin D deficiency, unspecified: Secondary | ICD-10-CM | POA: Diagnosis not present

## 2024-12-10 DIAGNOSIS — I10 Essential (primary) hypertension: Secondary | ICD-10-CM | POA: Diagnosis not present

## 2024-12-10 DIAGNOSIS — Z794 Long term (current) use of insulin: Secondary | ICD-10-CM

## 2024-12-10 DIAGNOSIS — N1831 Chronic kidney disease, stage 3a: Secondary | ICD-10-CM | POA: Diagnosis not present

## 2024-12-10 DIAGNOSIS — E782 Mixed hyperlipidemia: Secondary | ICD-10-CM

## 2024-12-10 LAB — POCT GLYCOSYLATED HEMOGLOBIN (HGB A1C): Hemoglobin A1C: 7.8 % — AB (ref 4.0–5.6)

## 2024-12-10 MED ORDER — GLIPIZIDE ER 5 MG PO TB24: 5.0000 mg | ORAL_TABLET | Freq: Every day | ORAL | 3 refills | Status: AC

## 2024-12-10 MED ORDER — DROPSAFE SAFETY PEN NEEDLES 31G X 6 MM MISC: 3 refills | Status: AC

## 2024-12-10 MED ORDER — LEVOTHYROXINE SODIUM 50 MCG PO TABS: 50.0000 ug | ORAL_TABLET | Freq: Every day | ORAL | 3 refills | Status: AC

## 2024-12-10 MED ORDER — TRADJENTA 5 MG PO TABS: 5.0000 mg | ORAL_TABLET | Freq: Every day | ORAL | 3 refills | Status: AC

## 2024-12-10 MED ORDER — METFORMIN HCL 500 MG PO TABS: 500.0000 mg | ORAL_TABLET | Freq: Two times a day (BID) | ORAL | 3 refills | Status: AC

## 2024-12-10 MED ORDER — HUMULIN R U-500 KWIKPEN 500 UNIT/ML ~~LOC~~ SOPN: PEN_INJECTOR | SUBCUTANEOUS | 0 refills | Status: AC

## 2024-12-10 NOTE — Progress Notes (Signed)
 " 12/10/2024  Endocrinology follow-up note   Subjective:    Patient ID: Ashley Blanchard, female    DOB: 08/22/1966.   She is being seen in follow-up for history of uncontrolled type 2 diabetes, hyperlipidemia, hypertension. She is accompanied by nursing staff who is not immersed in her medical condition.  PMD:   Carlette Benita Area, MD  Past Medical History:  Diagnosis Date   Chronic anemia    Diabetes mellitus, type II (HCC)    Hearing loss    Hyperlipidemia    Hypertension    Mild mental retardation    Past Surgical History:  Procedure Laterality Date   CATARACT EXTRACTION     RETINAL DETACHMENT SURGERY     Social History   Socioeconomic History   Marital status: Single    Spouse name: Not on file   Number of children: Not on file   Years of education: Not on file   Highest education level: Not on file  Occupational History   Not on file  Tobacco Use   Smoking status: Never   Smokeless tobacco: Never  Vaping Use   Vaping status: Never Used  Substance and Sexual Activity   Alcohol use: No   Drug use: No   Sexual activity: Never    Birth control/protection: Post-menopausal  Other Topics Concern   Not on file  Social History Narrative   Not on file   Social Drivers of Health   Tobacco Use: Low Risk (12/10/2024)   Patient History    Smoking Tobacco Use: Never    Smokeless Tobacco Use: Never    Passive Exposure: Not on file  Financial Resource Strain: Not on file  Food Insecurity: Not on file  Transportation Needs: No Transportation Needs (07/16/2023)   PRAPARE - Transportation    Lack of Transportation (Medical): No    Lack of Transportation (Non-Medical): No  Physical Activity: Not on file  Stress: Not on file  Social Connections: Not on file  Depression (EYV7-0): Not on file  Alcohol Screen: Not on file  Housing: Low Risk (07/16/2023)   Housing    Last Housing Risk Score: 0  Utilities: Not on file  Health Literacy: Not on file   Outpatient  Encounter Medications as of 12/10/2024  Medication Sig   ALLERGY RELIEF 10 MG tablet Take 10 mg by mouth daily.   aspirin  EC 81 MG tablet Take 81 mg by mouth daily.   atorvastatin  (LIPITOR) 40 MG tablet Take 1 tablet (40 mg total) by mouth daily.   Dextrose , Diabetic Use, (TRUEPLUS GLUCOSE PO) Take by mouth.   diltiazem  (CARDIZEM  CD) 180 MG 24 hr capsule Take 180 mg by mouth daily.   EASYMAX TEST test strip USE TO CHECK BLOOD SUGAR 4 TIMES DAILY BEFORE BREAKFAST,LUNCH,SUPPER AND AT BEDTIME.(HOLD INSULIN  IF BS<90:CALL MD IF BS>400)   FEROSUL 325 (65 Fe) MG tablet Take by mouth.   furosemide (LASIX) 20 MG tablet Take 20 mg by mouth.   Lancets 33G MISC USE TO CHECK BLOOD GLUCOSE FOUR TIMES DAILY AS INSTRUCTED   oseltamivir (TAMIFLU) 30 MG capsule Take 30 mg by mouth 2 (two) times daily.   ramipril (ALTACE) 5 MG capsule Take 5 mg by mouth daily.   risperiDONE (RISPERDAL) 1 MG tablet Take 1 mg by mouth at bedtime.   sertraline (ZOLOFT) 100 MG tablet Take 100 mg by mouth daily.   traZODone  (DESYREL ) 50 MG tablet Take 50 mg by mouth at bedtime.   TRUEPLUS GLUCOSE ON THE GO 4 g  chewable tablet Chew by mouth.   Vitamin D , Ergocalciferol , (DRISDOL ) 1.25 MG (50000 UNIT) CAPS capsule TAKE 1 CAPSULE BY MOUTH ONCE A WEEK.   [DISCONTINUED] DROPSAFE SAFETY PEN NEEDLES 31G X 6 MM MISC USE TO INJECT INSULIN  THREE TIMES DAILY.   [DISCONTINUED] GLIPIZIDE  XL 5 MG 24 hr tablet TAKE 1 TABLET BY MOUTH DAILY AT BREAKFAST.   [DISCONTINUED] HUMULIN  R U-500 KWIKPEN 500 UNIT/ML KwikPen INJECT 80 UNITS SQ WITH BREAKFAST & LUNCH IF GLUCOSE IS ABOVE 90 & EATING. INJECT 30 UNITS [place] WITH SUPPER IF GLUCOSE IS ABOVE 90 & EATING.   [DISCONTINUED] levothyroxine  (SYNTHROID ) 50 MCG tablet TAKE (1) TABLET BY MOUTH ONCE DAILY.   [DISCONTINUED] metFORMIN  (GLUCOPHAGE ) 500 MG tablet Take by mouth 2 (two) times daily with a meal.   [DISCONTINUED] TRADJENTA  5 MG TABS tablet Take 5 mg by mouth daily.   benzonatate (TESSALON) 100 MG capsule  Take by mouth. (Patient not taking: Reported on 12/10/2024)   glipiZIDE  (GLIPIZIDE  XL) 5 MG 24 hr tablet Take 1 tablet (5 mg total) by mouth daily with breakfast.   Insulin  Pen Needle (DROPSAFE SAFETY PEN NEEDLES) 31G X 6 MM MISC Use to inject insulin  3 times daily   insulin  regular human CONCENTRATED (HUMULIN  R U-500 KWIKPEN) 500 UNIT/ML KwikPen INJECT 80 UNITS SQ WITH BREAKFAST & LUNCH IF GLUCOSE IS ABOVE 90 & EATING. INJECT 30 UNITS [place] WITH SUPPER IF GLUCOSE IS ABOVE 90 & EATING.   levothyroxine  (SYNTHROID ) 50 MCG tablet Take 1 tablet (50 mcg total) by mouth daily before breakfast.   metFORMIN  (GLUCOPHAGE ) 500 MG tablet Take 1 tablet (500 mg total) by mouth 2 (two) times daily with a meal.   TRADJENTA  5 MG TABS tablet Take 1 tablet (5 mg total) by mouth daily.   No facility-administered encounter medications on file as of 12/10/2024.   ALLERGIES: No Known Allergies VACCINATION STATUS:  There is no immunization history on file for this patient.  Diabetes She presents for her follow-up diabetic visit. She has type 2 diabetes mellitus. Onset time: She is not sure when she was diagnosed with diabetes. Her disease course has been improving. There are no hypoglycemic associated symptoms. Pertinent negatives for hypoglycemia include no confusion, pallor or seizures. Pertinent negatives for diabetes include no fatigue, no polydipsia, no polyphagia and no polyuria. There are no hypoglycemic complications. Symptoms are stable. Diabetic complications include nephropathy. Risk factors for coronary artery disease include diabetes mellitus, hypertension, dyslipidemia, obesity and sedentary lifestyle. Current diabetic treatment includes intensive insulin  program and oral agent (triple therapy). She is compliant with treatment most of the time (She is in a nursing home due to failure to care for self.). Her weight is fluctuating minimally. She is following a generally unhealthy diet. When asked about meal  planning, she reported none. She has not had a previous visit with a dietitian. She rarely participates in exercise. Her home blood glucose trend is decreasing steadily. (She presents today, accompanied by care attendant from the nursing home, with her logs showing improving glycemic profile overall.  Her POCT A1c today is 7.8%, improving from last visit of 8.1%.  She has had mild hypoglycemia noted before bedtime on a few occasions.  There is no real pattern to her glucose fluctuations.) An ACE inhibitor/angiotensin II receptor blocker is being taken. She sees a podiatrist.Eye exam is current.    Review of systems  Constitutional: + minimally fluctuating body weight,  current Body mass index is 44.41 kg/m. , no fatigue, no subjective hyperthermia, no  subjective hypothermia Eyes: no blurry vision, no xerophthalmia ENT: no sore throat, no nodules palpated in throat, no dysphagia/odynophagia, no hoarseness Cardiovascular: no chest pain, no shortness of breath, no palpitations, no leg swelling Respiratory: no cough, no shortness of breath Gastrointestinal: no nausea/vomiting/diarrhea Musculoskeletal: no muscle/joint aches Skin: no rashes, no hyperemia Neurological: no tremors, no numbness, no tingling, no dizziness Psychiatric: no depression, no anxiety, mild MR  Objective:    BP 108/66 (BP Location: Right Arm, Patient Position: Sitting, Cuff Size: Large)   Pulse (!) 102   Ht 5' (1.524 m)   Wt 227 lb 6.4 oz (103.1 kg)   LMP  (LMP Unknown)   BMI 44.41 kg/m   Wt Readings from Last 3 Encounters:  12/10/24 227 lb 6.4 oz (103.1 kg)  08/31/24 227 lb (103 kg)  04/28/24 229 lb 9.6 oz (104.1 kg)   BP Readings from Last 3 Encounters:  12/10/24 108/66  08/31/24 102/74  04/28/24 138/78     Physical Exam- Limited  Constitutional:  Body mass index is 43.9 kg/m. , not in acute distress, mild MR, not conversational- nods and gestures appropriately Eyes:  EOMI, no exophthalmos Musculoskeletal:  no gross deformities, strength intact in all four extremities, no gross restriction of joint movements Skin:  no rashes, no hyperemia Neurological: no tremor with outstretched hands   Diabetic Foot Exam - Simple   Simple Foot Form Diabetic Foot exam was performed with the following findings: Yes 12/10/2024  1:51 PM  Visual Inspection No deformities, no ulcerations, no other skin breakdown bilaterally: Yes Sensation Testing Intact to touch and monofilament testing bilaterally: Yes Pulse Check Posterior Tibialis and Dorsalis pulse intact bilaterally: Yes Comments      Lipid Panel     Component Value Date/Time   CHOL 185 04/21/2024 0824   TRIG 102 04/21/2024 0824   HDL 48 04/21/2024 0824   CHOLHDL 3.9 04/21/2024 0824   CHOLHDL 4.0 05/19/2020 0820   LDLCALC 118 (H) 04/21/2024 0824   LDLCALC 129 (H) 05/19/2020 0820   Recent Results (from the past 2160 hours)  Comprehensive metabolic panel with GFR     Status: Abnormal   Collection Time: 12/03/24  8:33 AM  Result Value Ref Range   Glucose 205 (H) 70 - 99 mg/dL   BUN 19 8 - 27 mg/dL   Creatinine, Ser 8.52 (H) 0.57 - 1.00 mg/dL   eGFR 41 (L) >40 fO/fpw/8.26   BUN/Creatinine Ratio 13 12 - 28   Sodium 138 134 - 144 mmol/L   Potassium 5.3 (H) 3.5 - 5.2 mmol/L    Comment: Specimen received hemolyzed. Value may be increased by hemolysis. Clinical correlation indicated.    Chloride 98 96 - 106 mmol/L   CO2 24 20 - 29 mmol/L   Calcium  10.2 8.7 - 10.3 mg/dL   Total Protein 6.2 6.0 - 8.5 g/dL   Albumin 3.5 (L) 3.8 - 4.9 g/dL   Globulin, Total 2.7 1.5 - 4.5 g/dL   Bilirubin Total 0.3 0.0 - 1.2 mg/dL   Alkaline Phosphatase 105 49 - 135 IU/L   AST 22 0 - 40 IU/L   ALT 19 0 - 32 IU/L  TSH     Status: None   Collection Time: 12/03/24  8:33 AM  Result Value Ref Range   TSH 1.660 0.450 - 4.500 uIU/mL  T4, free     Status: None   Collection Time: 12/03/24  8:33 AM  Result Value Ref Range   Free T4 1.09 0.82 - 1.77 ng/dL  HgB A1c      Status: Abnormal   Collection Time: 12/10/24  1:49 PM  Result Value Ref Range   Hemoglobin A1C 7.8 (A) 4.0 - 5.6 %   HbA1c POC (<> result, manual entry)     HbA1c, POC (prediabetic range)     HbA1c, POC (controlled diabetic range)         Assessment & Plan:   1) Controlled type 2 diabetes mellitus with complication, without long-term current use of insulin  (HCC)  She presents today, accompanied by care attendant from the nursing home, with her logs showing improving glycemic profile overall.  Her POCT A1c today is 7.8%, improving from last visit of 8.1%.  She has had mild hypoglycemia noted before bedtime on a few occasions.  There is no real pattern to her glucose fluctuations.  - She has complications from diabetes including CKD however patient remains at a high risk for more acute and chronic complications of diabetes which include CAD, CVA, CKD, retinopathy, and neuropathy.   -  She does not understand the high risk she has, these are all discussed with her care manager from the group home.  - Nutritional counseling repeated/built upon at each appointment.  - The patient admits there is a room for improvement in their diet and drink choices. -  Suggestion is made for the patient to avoid simple carbohydrates from their diet including Cakes, Sweet Desserts / Pastries, Ice Cream, Soda (diet and regular), Sweet Tea, Candies, Chips, Cookies, Sweet Pastries, Store Bought Juices, Alcohol in Excess of 1-2 drinks a day, Artificial Sweeteners, Coffee Creamer, and Sugar-free Products. This will help patient to have stable blood glucose profile and potentially avoid unintended weight gain.   - I encouraged the patient to switch to unprocessed or minimally processed complex starch and increased protein intake (animal or plant source), fruits, and vegetables.   - Patient is advised to stick to a routine mealtimes to eat 3 meals a day and avoid unnecessary snacks (to snack only to correct  hypoglycemia).  - I have approached patient with the following individualized plan to manage diabetes and patient agrees:   - Number 1 priority in her case is to avoid hypoglycemia.  -She will continue to need intensive treatment with higher dose of insulin  in order for her to achieve and maintain control of diabetes to target.   -Given her stable, improving glycemic profile, no changes will be made to her regimen today.  She is advised to continue her Humulin  R- U500 to 80 units with breakfast and lunch and continue her 30 units with supper, if glucose is above 90 and she is eating, continue Metformin  500 mg po twice daily with meals, Glipizide  5 mg XL daily with breakfast, and Tradjenta  5 mg po daily.   -She is encouraged to continue monitoring blood glucose 4 times daily, before meals and before bed, and to call the clinic if she has readings less than 70 or greater than 300 for 3 tests in a row.  - She is not suitable candidate for SGLT2 inhibitors.  - Patient specific target  A1c;  LDL, HDL, Triglycerides,  were discussed in detail.  2) BP/HTN :  Her blood pressure is controlled to target.   She is advised to continue her current blood pressure medications including Ramipril 5 mg p.o. daily with breakfast, and Lasix 20 mg po daily.    3) Lipids/HPL:  Her most recent lipid panel from 04/21/24 shows uncontrolled LDL of 118.  She  is advised to continue Lipitor 40 mg po daily at bedtime.  Side effects and precautions discussed with her.    4) Vitamin D  deficiency:  Her most recent vitamin D  level was 81 on 04/21/24.  I discussed and initiated Ergocalciferol  50000 units po weekly x 12 weeks on previous visit.  She can continue this for now.  5)  Weight/Diet:  Her Body mass index is 44.41 kg/m.  This clearly is complicating her diabetes care.  She is a candidate for modest weight loss.  She cannot exercise optimally.   Exercise, and detailed carbohydrates information provided.  6) Abnormal  TSH/ Hypothyroidism Her recent thyroid labs are suggestive of hypothyroidism.   -Her previsit TFTs are consistent with appropriate hormone replacement.  She is advised to continue Levothyroxine  50 mcg po daily before breakfast.    - The correct intake of thyroid hormone (Levothyroxine , Synthroid ), is on empty stomach first thing in the morning, with water, separated by at least 30 minutes from breakfast and other medications,  and separated by more than 4 hours from calcium , iron, multivitamins, acid reflux medications (PPIs).  - This medication is a life-long medication and will be needed to correct thyroid hormone imbalances for the rest of your life.  The dose may change from time to time, based on thyroid blood work.  - It is extremely important to be consistent taking this medication, near the same time each morning.  -AVOID TAKING PRODUCTS CONTAINING BIOTIN (commonly found in Hair, Skin, Nails vitamins) AS IT INTERFERES WITH THE VALIDITY OF THYROID FUNCTION BLOOD TESTS.  7) Chronic Care/Health Maintenance: -Patient is on ACEI/ARB and Statin medications and encouraged to continue to follow up with Ophthalmology, Podiatrist at least yearly or according to recommendations, and advised to   stay away from smoking. I have recommended yearly flu vaccine and pneumonia vaccination at least every 5 years;  60 minutes of walking 4 days a week is recommended for her , and  sleep for at least 7 hours a day.   - I advised patient to maintain close follow up with Fanta, Tesfaye Demissie, MD for primary care needs.     I spent  46  minutes in the care of the patient today including review of labs from CMP, Lipids, Thyroid Function, Hematology (current and previous including abstractions from other facilities); face-to-face time discussing  her blood glucose readings/logs, discussing hypoglycemia and hyperglycemia episodes and symptoms, medications doses, her options of short and long term treatment  based on the latest standards of care / guidelines;  discussion about incorporating lifestyle medicine;  and documenting the encounter. Risk reduction counseling performed per USPSTF guidelines to reduce obesity and cardiovascular risk factors.     Please refer to Patient Instructions for Blood Glucose Monitoring and Insulin /Medications Dosing Guide  in media tab for additional information. Please  also refer to  Patient Self Inventory in the Media  tab for reviewed elements of pertinent patient history.  Elanore Bazar participated in the discussions, expressed understanding, and voiced agreement with the above plans.  All questions were answered to her satisfaction. she is encouraged to contact clinic should she have any questions or concerns prior to her return visit.    Follow up plan: - Return in about 4 months (around 04/09/2025) for Diabetes F/U with A1c in office, No previsit labs, Bring meter and logs.   Benton Rio, [facility] [address] [place], [place] [address] Phone: [phone] Fax: [phone]  12/10/2024, 2:57 PM "

## 2025-04-08 ENCOUNTER — Ambulatory Visit: Admitting: Nurse Practitioner
